# Patient Record
Sex: Female | Born: 1975 | Race: White | Hispanic: No | Marital: Single | Smoking: Current every day smoker
Health system: Southern US, Community
[De-identification: ages and names within clinical notes are randomized; demographics above are authoritative.]

## PROBLEM LIST (undated history)

## (undated) DIAGNOSIS — K219 Gastro-esophageal reflux disease without esophagitis: Secondary | ICD-10-CM

## (undated) DIAGNOSIS — E119 Type 2 diabetes mellitus without complications: Secondary | ICD-10-CM

## (undated) DIAGNOSIS — E7800 Pure hypercholesterolemia, unspecified: Secondary | ICD-10-CM

## (undated) DIAGNOSIS — F909 Attention-deficit hyperactivity disorder, unspecified type: Secondary | ICD-10-CM

## (undated) DIAGNOSIS — E559 Vitamin D deficiency, unspecified: Secondary | ICD-10-CM

## (undated) DIAGNOSIS — F319 Bipolar disorder, unspecified: Secondary | ICD-10-CM

## (undated) DIAGNOSIS — E079 Disorder of thyroid, unspecified: Secondary | ICD-10-CM

## (undated) HISTORY — DX: Bipolar disorder, unspecified: F31.9

## (undated) HISTORY — DX: Attention-deficit hyperactivity disorder, unspecified type: F90.9

## (undated) HISTORY — DX: Type 2 diabetes mellitus without complications: E11.9

## (undated) HISTORY — DX: Disorder of thyroid, unspecified: E07.9

## (undated) HISTORY — PX: DILATION AND CURETTAGE OF UTERUS: SHX78

## (undated) HISTORY — DX: Vitamin D deficiency, unspecified: E55.9

## (undated) HISTORY — DX: Pure hypercholesterolemia, unspecified: E78.00

## (undated) HISTORY — DX: Gastro-esophageal reflux disease without esophagitis: K21.9

---

## 2012-08-17 ENCOUNTER — Other Ambulatory Visit: Payer: Self-pay | Admitting: Neurology

## 2012-08-17 DIAGNOSIS — H539 Unspecified visual disturbance: Secondary | ICD-10-CM

## 2012-08-17 DIAGNOSIS — G8929 Other chronic pain: Secondary | ICD-10-CM

## 2012-08-21 ENCOUNTER — Ambulatory Visit (HOSPITAL_COMMUNITY)
Admission: RE | Admit: 2012-08-21 | Discharge: 2012-08-21 | Disposition: A | Discharge status: Home / Self Care | Payer: Medicaid Other | Source: Ambulatory Visit | Attending: Neurology | Admitting: Neurology

## 2012-08-21 DIAGNOSIS — H539 Unspecified visual disturbance: Secondary | ICD-10-CM

## 2012-08-21 DIAGNOSIS — H538 Other visual disturbances: Secondary | ICD-10-CM | POA: Insufficient documentation

## 2012-08-21 DIAGNOSIS — R51 Headache: Secondary | ICD-10-CM | POA: Insufficient documentation

## 2012-08-21 DIAGNOSIS — G8929 Other chronic pain: Secondary | ICD-10-CM

## 2014-07-05 ENCOUNTER — Ambulatory Visit (INDEPENDENT_AMBULATORY_CARE_PROVIDER_SITE_OTHER): Payer: 59 | Admitting: Psychiatry

## 2014-07-05 ENCOUNTER — Encounter (HOSPITAL_COMMUNITY): Payer: Self-pay | Admitting: Psychiatry

## 2014-07-05 VITALS — BP 102/78 | Ht 62.0 in | Wt 178.0 lb

## 2014-07-05 DIAGNOSIS — F3130 Bipolar disorder, current episode depressed, mild or moderate severity, unspecified: Secondary | ICD-10-CM | POA: Insufficient documentation

## 2014-07-05 MED ORDER — LITHIUM CARBONATE ER 450 MG PO TBCR: 450.0000 mg | EXTENDED_RELEASE_TABLET | Freq: Two times a day (BID) | ORAL | Status: DC

## 2014-07-05 MED ORDER — HYDROXYZINE HCL 50 MG PO TABS: 50.0000 mg | ORAL_TABLET | Freq: Four times a day (QID) | ORAL | Status: DC | PRN

## 2014-07-05 MED ORDER — ALPRAZOLAM 0.5 MG PO TABS: 0.5000 mg | ORAL_TABLET | Freq: Three times a day (TID) | ORAL | Status: DC | PRN

## 2014-07-05 MED ORDER — ARIPIPRAZOLE 5 MG PO TABS: 7.5000 mg | ORAL_TABLET | Freq: Every day | ORAL | Status: DC
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
## 2014-07-14 LAB — BASIC METABOLIC PANEL
BUN: 9 mg/dL (ref 6–23)
CO2: 23 mEq/L (ref 19–32)
Calcium: 9.5 mg/dL (ref 8.4–10.5)
Chloride: 109 mEq/L (ref 96–112)
Creat: 0.75 mg/dL (ref 0.50–1.10)
Glucose, Bld: 89 mg/dL (ref 70–99)
Potassium: 4.5 mEq/L (ref 3.5–5.3)
Sodium: 139 mEq/L (ref 135–145)

## 2014-07-14 LAB — LITHIUM LEVEL: Lithium Lvl: 0.4 mEq/L — ABNORMAL LOW (ref 0.80–1.40)

## 2014-08-03 ENCOUNTER — Encounter (HOSPITAL_COMMUNITY): Payer: Self-pay | Admitting: Psychiatry

## 2014-08-03 ENCOUNTER — Ambulatory Visit (INDEPENDENT_AMBULATORY_CARE_PROVIDER_SITE_OTHER): Payer: 59 | Admitting: Psychiatry

## 2014-08-03 VITALS — BP 120/78 | Ht 62.0 in | Wt 180.0 lb

## 2014-08-03 DIAGNOSIS — F3130 Bipolar disorder, current episode depressed, mild or moderate severity, unspecified: Secondary | ICD-10-CM

## 2014-08-03 MED ORDER — ARIPIPRAZOLE 10 MG PO TABS: 10.0000 mg | ORAL_TABLET | Freq: Every day | ORAL | Status: DC
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
## 2014-08-08 ENCOUNTER — Telehealth (HOSPITAL_COMMUNITY): Payer: Self-pay | Admitting: *Deleted

## 2014-08-08 ENCOUNTER — Other Ambulatory Visit (HOSPITAL_COMMUNITY): Payer: Self-pay | Admitting: Psychiatry

## 2014-08-08 MED ORDER — OXCARBAZEPINE 150 MG PO TABS: 150.0000 mg | ORAL_TABLET | Freq: Every day | ORAL | Status: DC

## 2014-08-08 NOTE — Telephone Encounter (Signed)
pt calling stating that her Lithium she started is not working for her. She would like for Dr. Tenny Crawoss to call her back.

## 2014-08-08 NOTE — Telephone Encounter (Signed)
Lithium was stopped last week. Now on abilify 10 mg daily. Having lots of mood swings. Will cut abilify down to 5 mg and start trieptal 150 mg daily

## 2014-08-31 ENCOUNTER — Encounter (HOSPITAL_COMMUNITY): Payer: Self-pay | Admitting: Psychiatry

## 2014-08-31 ENCOUNTER — Ambulatory Visit (INDEPENDENT_AMBULATORY_CARE_PROVIDER_SITE_OTHER): Payer: 59 | Admitting: Psychiatry

## 2014-08-31 VITALS — BP 126/92 | HR 96 | Ht 62.0 in | Wt 158.0 lb

## 2014-08-31 DIAGNOSIS — F3130 Bipolar disorder, current episode depressed, mild or moderate severity, unspecified: Secondary | ICD-10-CM

## 2014-08-31 MED ORDER — ARIPIPRAZOLE 10 MG PO TABS: ORAL_TABLET | ORAL | Status: DC

## 2014-08-31 MED ORDER — ALPRAZOLAM 0.5 MG PO TABS
0.5000 mg | ORAL_TABLET | Freq: Three times a day (TID) | ORAL | Status: DC | PRN
Start: 2014-08-31 — End: 2014-11-03

## 2014-08-31 MED ORDER — HYDROXYZINE HCL 50 MG PO TABS: 50.0000 mg | ORAL_TABLET | Freq: Four times a day (QID) | ORAL | Status: DC | PRN
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
## 2014-09-30 ENCOUNTER — Ambulatory Visit (INDEPENDENT_AMBULATORY_CARE_PROVIDER_SITE_OTHER): Payer: 59 | Admitting: Psychiatry

## 2014-09-30 ENCOUNTER — Encounter (HOSPITAL_COMMUNITY): Payer: Self-pay | Admitting: Psychiatry

## 2014-09-30 VITALS — BP 120/82 | Ht 62.0 in | Wt 179.0 lb

## 2014-09-30 DIAGNOSIS — F3162 Bipolar disorder, current episode mixed, moderate: Secondary | ICD-10-CM

## 2014-09-30 DIAGNOSIS — F319 Bipolar disorder, unspecified: Secondary | ICD-10-CM

## 2014-09-30 MED ORDER — ARIPIPRAZOLE 10 MG PO TABS: ORAL_TABLET | ORAL | Status: DC
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
## 2014-10-20 ENCOUNTER — Telehealth (HOSPITAL_COMMUNITY): Payer: Self-pay | Admitting: *Deleted

## 2014-10-20 NOTE — Telephone Encounter (Signed)
Pt calling stating she came in on 09-30-14 and was given samples Latuda 20, 40, 60. Pt states she will be out of these samples before her next appt and would like to know if she could get more samples. Pt next appt is 11-11-14.

## 2014-10-20 NOTE — Telephone Encounter (Signed)
She can have more 60 mg but I gave her enough for 4 weeks

## 2014-10-20 NOTE — Telephone Encounter (Signed)
Pt is aware of Dr. Ross decisions and shows understanding 

## 2014-11-01 ENCOUNTER — Ambulatory Visit (HOSPITAL_COMMUNITY): Payer: Self-pay | Admitting: Psychiatry

## 2014-11-03 ENCOUNTER — Ambulatory Visit (INDEPENDENT_AMBULATORY_CARE_PROVIDER_SITE_OTHER): Payer: 59 | Admitting: Psychiatry

## 2014-11-03 ENCOUNTER — Encounter (HOSPITAL_COMMUNITY): Payer: Self-pay | Admitting: Psychiatry

## 2014-11-03 VITALS — BP 123/86 | HR 90 | Ht 62.0 in | Wt 183.0 lb

## 2014-11-03 DIAGNOSIS — F3130 Bipolar disorder, current episode depressed, mild or moderate severity, unspecified: Secondary | ICD-10-CM

## 2014-11-03 DIAGNOSIS — F3162 Bipolar disorder, current episode mixed, moderate: Secondary | ICD-10-CM

## 2014-11-03 MED ORDER — LURASIDONE HCL 60 MG PO TABS: 60.0000 mg | ORAL_TABLET | Freq: Every day | ORAL | Status: DC

## 2014-11-03 MED ORDER — ARIPIPRAZOLE 10 MG PO TABS: ORAL_TABLET | ORAL | Status: DC

## 2014-11-03 MED ORDER — HYDROXYZINE HCL 50 MG PO TABS: 50.0000 mg | ORAL_TABLET | Freq: Four times a day (QID) | ORAL | Status: DC | PRN

## 2014-11-03 MED ORDER — ALPRAZOLAM 0.5 MG PO TABS: 0.5000 mg | ORAL_TABLET | Freq: Three times a day (TID) | ORAL | Status: DC | PRN
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
## 2015-02-03 ENCOUNTER — Ambulatory Visit (INDEPENDENT_AMBULATORY_CARE_PROVIDER_SITE_OTHER): Payer: 59 | Admitting: Psychiatry

## 2015-02-03 ENCOUNTER — Encounter (HOSPITAL_COMMUNITY): Payer: Self-pay | Admitting: Psychiatry

## 2015-02-03 VITALS — Ht 62.0 in | Wt 177.2 lb

## 2015-02-03 DIAGNOSIS — F3130 Bipolar disorder, current episode depressed, mild or moderate severity, unspecified: Secondary | ICD-10-CM

## 2015-02-03 DIAGNOSIS — F3162 Bipolar disorder, current episode mixed, moderate: Secondary | ICD-10-CM

## 2015-02-03 MED ORDER — ARIPIPRAZOLE 10 MG PO TABS: ORAL_TABLET | ORAL | Status: DC

## 2015-02-03 MED ORDER — ALPRAZOLAM 0.5 MG PO TABS: 0.5000 mg | ORAL_TABLET | Freq: Four times a day (QID) | ORAL | Status: DC

## 2015-02-03 MED ORDER — LURASIDONE HCL 60 MG PO TABS: 60.0000 mg | ORAL_TABLET | Freq: Every day | ORAL | Status: DC
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
## 2015-02-16 ENCOUNTER — Telehealth (HOSPITAL_COMMUNITY): Payer: Self-pay | Admitting: *Deleted

## 2015-02-16 NOTE — Telephone Encounter (Signed)
Called [place] [employer] to submit prior authorization as it will not be approved through cover my meds. Approval given by Tresa EndoKelly for 02/16/15-02/17/16 confirmation #[id number]. Notified pharmacy of approval.

## 2015-02-16 NOTE — Telephone Encounter (Signed)
Prior authorization for Abilify received. Submitted through cover my meds.

## 2015-04-20 ENCOUNTER — Telehealth (HOSPITAL_COMMUNITY): Payer: Self-pay | Admitting: *Deleted

## 2015-04-20 ENCOUNTER — Other Ambulatory Visit (HOSPITAL_COMMUNITY): Payer: Self-pay | Admitting: Psychiatry

## 2015-04-20 MED ORDER — LURASIDONE HCL 60 MG PO TABS: 60.0000 mg | ORAL_TABLET | Freq: Every day | ORAL | Status: DC

## 2015-04-20 MED ORDER — ARIPIPRAZOLE 10 MG PO TABS: ORAL_TABLET | ORAL | Status: DC

## 2015-04-20 NOTE — Telephone Encounter (Signed)
Pt pharmacy is requesting refills for pt Abilify 10 mg  BID and Latuda 60 mg QD. Both medications were filled 02-03-15 and was informed to f/u in 3 mo which will be 05-03-15. Pt pharmacy number is [id number]

## 2015-04-20 NOTE — Telephone Encounter (Signed)
sent 

## 2015-04-20 NOTE — Telephone Encounter (Signed)
Noted  

## 2015-05-03 ENCOUNTER — Ambulatory Visit (INDEPENDENT_AMBULATORY_CARE_PROVIDER_SITE_OTHER): Payer: 59 | Admitting: Psychiatry

## 2015-05-03 ENCOUNTER — Encounter (HOSPITAL_COMMUNITY): Payer: Self-pay | Admitting: Psychiatry

## 2015-05-03 VITALS — BP 117/74 | HR 82 | Ht 62.0 in | Wt 180.0 lb

## 2015-05-03 DIAGNOSIS — F3162 Bipolar disorder, current episode mixed, moderate: Secondary | ICD-10-CM | POA: Diagnosis not present

## 2015-05-03 MED ORDER — LURASIDONE HCL 60 MG PO TABS: 60.0000 mg | ORAL_TABLET | Freq: Every day | ORAL | Status: DC

## 2015-05-03 MED ORDER — ARIPIPRAZOLE 10 MG PO TABS: ORAL_TABLET | ORAL | Status: DC

## 2015-05-03 MED ORDER — ALPRAZOLAM 0.5 MG PO TABS: 0.5000 mg | ORAL_TABLET | Freq: Four times a day (QID) | ORAL | Status: DC
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
## 2015-08-03 ENCOUNTER — Encounter (HOSPITAL_COMMUNITY): Payer: Self-pay | Admitting: Psychiatry

## 2015-08-03 ENCOUNTER — Ambulatory Visit (INDEPENDENT_AMBULATORY_CARE_PROVIDER_SITE_OTHER): Payer: 59 | Admitting: Psychiatry

## 2015-08-03 VITALS — BP 116/84 | HR 101 | Ht 62.0 in | Wt 179.4 lb

## 2015-08-03 DIAGNOSIS — F3130 Bipolar disorder, current episode depressed, mild or moderate severity, unspecified: Secondary | ICD-10-CM

## 2015-08-03 DIAGNOSIS — F3162 Bipolar disorder, current episode mixed, moderate: Secondary | ICD-10-CM

## 2015-08-03 MED ORDER — LURASIDONE HCL 60 MG PO TABS: 60.0000 mg | ORAL_TABLET | Freq: Every day | ORAL | Status: DC

## 2015-08-03 MED ORDER — ARIPIPRAZOLE 10 MG PO TABS: ORAL_TABLET | ORAL | Status: DC

## 2015-08-03 MED ORDER — TEMAZEPAM 15 MG PO CAPS: 15.0000 mg | ORAL_CAPSULE | Freq: Every evening | ORAL | Status: DC | PRN

## 2015-08-03 MED ORDER — ALPRAZOLAM 0.5 MG PO TABS: 0.5000 mg | ORAL_TABLET | Freq: Four times a day (QID) | ORAL | Status: DC
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
## 2015-09-29 ENCOUNTER — Telehealth (HOSPITAL_COMMUNITY): Payer: Self-pay | Admitting: *Deleted

## 2015-09-29 NOTE — Telephone Encounter (Signed)
lmtcb number provided 

## 2015-09-29 NOTE — Telephone Encounter (Signed)
phone call at 8:52 a.m. from patient, stated she has an appointment on the 11th, she think she is coming down with flu.   she need refill of medications.

## 2015-10-02 NOTE — Telephone Encounter (Signed)
Pt appt was rescheduled and was informed that her pharmacy should still have 1 more refill for her. Pt showed understanding

## 2015-10-03 ENCOUNTER — Ambulatory Visit (HOSPITAL_COMMUNITY): Payer: Self-pay | Admitting: Psychiatry

## 2015-10-19 ENCOUNTER — Encounter (HOSPITAL_COMMUNITY): Payer: Self-pay | Admitting: Psychiatry

## 2015-10-19 ENCOUNTER — Ambulatory Visit (INDEPENDENT_AMBULATORY_CARE_PROVIDER_SITE_OTHER): Payer: 59 | Admitting: Psychiatry

## 2015-10-19 VITALS — BP 123/86 | HR 95 | Ht 62.0 in | Wt 180.2 lb

## 2015-10-19 DIAGNOSIS — F3130 Bipolar disorder, current episode depressed, mild or moderate severity, unspecified: Secondary | ICD-10-CM | POA: Diagnosis not present

## 2015-10-19 DIAGNOSIS — F3162 Bipolar disorder, current episode mixed, moderate: Secondary | ICD-10-CM

## 2015-10-19 MED ORDER — TEMAZEPAM 15 MG PO CAPS: 15.0000 mg | ORAL_CAPSULE | Freq: Every evening | ORAL | Status: DC | PRN

## 2015-10-19 MED ORDER — ALPRAZOLAM 0.5 MG PO TABS: 0.5000 mg | ORAL_TABLET | Freq: Four times a day (QID) | ORAL | Status: DC

## 2015-10-19 MED ORDER — LURASIDONE HCL 80 MG PO TABS: 80.0000 mg | ORAL_TABLET | Freq: Every day | ORAL | Status: DC

## 2015-10-19 MED ORDER — ARIPIPRAZOLE 10 MG PO TABS: ORAL_TABLET | ORAL | Status: DC
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
## 2015-12-19 ENCOUNTER — Encounter (HOSPITAL_COMMUNITY): Payer: Self-pay | Admitting: Psychiatry

## 2015-12-19 ENCOUNTER — Ambulatory Visit (INDEPENDENT_AMBULATORY_CARE_PROVIDER_SITE_OTHER): Payer: Medicaid Other | Admitting: Psychiatry

## 2015-12-19 VITALS — BP 113/70 | HR 95 | Ht 62.0 in | Wt 178.2 lb

## 2015-12-19 DIAGNOSIS — F3162 Bipolar disorder, current episode mixed, moderate: Secondary | ICD-10-CM | POA: Diagnosis not present

## 2015-12-19 MED ORDER — ALPRAZOLAM 1 MG PO TABS: 1.0000 mg | ORAL_TABLET | Freq: Three times a day (TID) | ORAL | Status: DC | PRN

## 2015-12-19 MED ORDER — ARIPIPRAZOLE 10 MG PO TABS: ORAL_TABLET | ORAL | Status: DC

## 2015-12-19 MED ORDER — TEMAZEPAM 15 MG PO CAPS: 15.0000 mg | ORAL_CAPSULE | Freq: Every evening | ORAL | Status: DC | PRN

## 2015-12-19 MED ORDER — LURASIDONE HCL 80 MG PO TABS: 80.0000 mg | ORAL_TABLET | Freq: Every day | ORAL | Status: DC
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
## 2016-02-19 ENCOUNTER — Ambulatory Visit (INDEPENDENT_AMBULATORY_CARE_PROVIDER_SITE_OTHER): Payer: Medicaid Other | Admitting: Psychiatry

## 2016-02-19 ENCOUNTER — Encounter (HOSPITAL_COMMUNITY): Payer: Self-pay | Admitting: Psychiatry

## 2016-02-19 VITALS — BP 116/87 | HR 98 | Ht 62.0 in | Wt 174.8 lb

## 2016-02-19 DIAGNOSIS — F3162 Bipolar disorder, current episode mixed, moderate: Secondary | ICD-10-CM | POA: Diagnosis not present

## 2016-02-19 MED ORDER — ARIPIPRAZOLE 10 MG PO TABS: ORAL_TABLET | ORAL | Status: DC

## 2016-02-19 MED ORDER — TEMAZEPAM 15 MG PO CAPS: 15.0000 mg | ORAL_CAPSULE | Freq: Every evening | ORAL | Status: DC | PRN

## 2016-02-19 MED ORDER — ALPRAZOLAM 1 MG PO TABS: 1.0000 mg | ORAL_TABLET | Freq: Three times a day (TID) | ORAL | Status: DC | PRN

## 2016-02-19 MED ORDER — LURASIDONE HCL 80 MG PO TABS: 80.0000 mg | ORAL_TABLET | Freq: Every day | ORAL | Status: DC
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
## 2016-03-12 ENCOUNTER — Other Ambulatory Visit (HOSPITAL_COMMUNITY): Payer: Self-pay | Admitting: Psychiatry

## 2016-03-12 ENCOUNTER — Telehealth (HOSPITAL_COMMUNITY): Payer: Self-pay | Admitting: *Deleted

## 2016-03-12 MED ORDER — LURASIDONE HCL 20 MG PO TABS: 20.0000 mg | ORAL_TABLET | Freq: Every day | ORAL | Status: DC

## 2016-03-12 NOTE — Telephone Encounter (Signed)
patient would like a phone call please regarding her Latuda.  She said the last time she was here the doctor asked her if she had restless leg.  She said her legs are restless, she can't keep them still, and Latuda is making her eyes twitch.

## 2016-03-12 NOTE — Telephone Encounter (Signed)
Spoken to pt-I have cut down dosage to 20 mg

## 2016-03-12 NOTE — Telephone Encounter (Signed)
noted 

## 2016-04-11 ENCOUNTER — Telehealth (HOSPITAL_COMMUNITY): Payer: Self-pay | Admitting: *Deleted

## 2016-04-11 NOTE — Telephone Encounter (Signed)
Pt called stating the Kasandra KnudsenLatuda is not working. Per pt, her boyfriend is getting ready to have open heart surgery and she have a lot going on and the JordanLatuda is not doing anything. Pt states she is irritated, can not consentrate and can not keep mind on one thing and it keeps going from one this to the next. Asked pt if this is an emergency and if she is of danger to herself or anyone else and pt stated no. Informed pt that provider is out of the office and is she able to wait until provider returns to office for a response and pt stated yes she can wait. Per pt, she will keep taking medication until provider respond. Pt number is [phone].

## 2016-04-15 NOTE — Telephone Encounter (Signed)
Will increase latuda to 40 mg

## 2016-04-16 ENCOUNTER — Telehealth (HOSPITAL_COMMUNITY): Payer: Self-pay | Admitting: *Deleted

## 2016-04-16 MED ORDER — LURASIDONE HCL 40 MG PO TABS: 40.0000 mg | ORAL_TABLET | Freq: Every day | ORAL | Status: DC

## 2016-04-16 NOTE — Telephone Encounter (Signed)
Pt called back and office informed her of Dr. Tenny Crawoss increasing her Latuda to 40 mg QD. Office sent new script to pt pharmacy due to pt stated she is out of her 80 mg that she was cutting in half.

## 2016-04-16 NOTE — Telephone Encounter (Signed)
Per Dr. Tenny Crawoss, she will increase pt Latuda to 40 mg due to previous call. Called pt and informed her and she shows understanding.

## 2016-04-16 NOTE — Telephone Encounter (Signed)
Was speaking with pt but connection was bad and phone got disconnected. Before phone was disconnected, informed pt of what Dr. Tenny Crawoss stated and showed understanding. Office tried calling pt back but phone rang and no answer but pt do not have voicemail set up yet. Will try calling back at another time.

## 2016-05-01 ENCOUNTER — Telehealth (HOSPITAL_COMMUNITY): Payer: Self-pay | Admitting: *Deleted

## 2016-05-01 NOTE — Telephone Encounter (Signed)
You may send in one month

## 2016-05-01 NOTE — Telephone Encounter (Signed)
Pt pharmacy faxed refill request for pt Xanax. Medication last filled 02-19-16 with 2 refills. Pt f/u appt is scheduled for 05-17-16. Pharmacy number is [phone]

## 2016-05-02 ENCOUNTER — Telehealth (HOSPITAL_COMMUNITY): Payer: Self-pay | Admitting: *Deleted

## 2016-05-02 MED ORDER — ALPRAZOLAM 1 MG PO TABS: 1.0000 mg | ORAL_TABLET | Freq: Three times a day (TID) | ORAL | Status: DC | PRN

## 2016-05-02 NOTE — Telephone Encounter (Signed)
Per provider to call in one month refill for pt Xanax. Called pharmacy and spoke with Greig CastillaAndrew.

## 2016-05-02 NOTE — Telephone Encounter (Signed)
Called pt pharmacy and spoke with Greig CastillaAndrew. Per Greig CastillaAndrew they will get medication ready for pt

## 2016-05-17 ENCOUNTER — Encounter (HOSPITAL_COMMUNITY): Payer: Self-pay | Admitting: Psychiatry

## 2016-05-17 ENCOUNTER — Ambulatory Visit (INDEPENDENT_AMBULATORY_CARE_PROVIDER_SITE_OTHER): Payer: Medicaid Other | Admitting: Psychiatry

## 2016-05-17 VITALS — BP 100/80 | Ht 62.0 in | Wt 176.0 lb

## 2016-05-17 DIAGNOSIS — F3130 Bipolar disorder, current episode depressed, mild or moderate severity, unspecified: Secondary | ICD-10-CM

## 2016-05-17 DIAGNOSIS — F3162 Bipolar disorder, current episode mixed, moderate: Secondary | ICD-10-CM

## 2016-05-17 MED ORDER — TEMAZEPAM 15 MG PO CAPS: 15.0000 mg | ORAL_CAPSULE | Freq: Every evening | ORAL | Status: DC | PRN

## 2016-05-17 MED ORDER — ARIPIPRAZOLE 10 MG PO TABS: ORAL_TABLET | ORAL | Status: DC

## 2016-05-17 MED ORDER — ALPRAZOLAM 1 MG PO TABS: 1.0000 mg | ORAL_TABLET | Freq: Three times a day (TID) | ORAL | Status: DC | PRN

## 2016-05-17 MED ORDER — LURASIDONE HCL 40 MG PO TABS: 40.0000 mg | ORAL_TABLET | Freq: Every day | ORAL | Status: DC
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
## 2016-07-10 ENCOUNTER — Encounter (HOSPITAL_COMMUNITY): Payer: Self-pay | Admitting: Psychiatry

## 2016-07-10 ENCOUNTER — Telehealth (HOSPITAL_COMMUNITY): Payer: Self-pay | Admitting: *Deleted

## 2016-07-10 ENCOUNTER — Encounter (HOSPITAL_COMMUNITY): Payer: Self-pay | Admitting: *Deleted

## 2016-07-10 ENCOUNTER — Ambulatory Visit (INDEPENDENT_AMBULATORY_CARE_PROVIDER_SITE_OTHER): Payer: Medicaid Other | Admitting: Psychiatry

## 2016-07-10 VITALS — BP 130/90 | Ht 62.0 in | Wt 176.0 lb

## 2016-07-10 DIAGNOSIS — F3162 Bipolar disorder, current episode mixed, moderate: Secondary | ICD-10-CM

## 2016-07-10 DIAGNOSIS — F3130 Bipolar disorder, current episode depressed, mild or moderate severity, unspecified: Secondary | ICD-10-CM | POA: Diagnosis not present

## 2016-07-10 MED ORDER — LURASIDONE HCL 40 MG PO TABS: 40.0000 mg | ORAL_TABLET | Freq: Two times a day (BID) | ORAL | Status: DC

## 2016-07-10 MED ORDER — TEMAZEPAM 15 MG PO CAPS: 15.0000 mg | ORAL_CAPSULE | Freq: Every evening | ORAL | Status: DC | PRN

## 2016-07-10 MED ORDER — ARIPIPRAZOLE 10 MG PO TABS
ORAL_TABLET | ORAL | Status: DC
Start: 2016-07-10 — End: 2016-08-16

## 2016-07-10 MED ORDER — ALPRAZOLAM 1 MG PO TABS: 1.0000 mg | ORAL_TABLET | Freq: Three times a day (TID) | ORAL | Status: DC | PRN

## 2016-07-10 NOTE — Telephone Encounter (Signed)
She will need to come in 
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
## 2016-07-10 NOTE — Telephone Encounter (Signed)
Pt called stating the Latuda 40 mg is not working. Per pt, she is dealing with a lot right now. Per pt, is it possible if Dr. Tenny Crawoss could put her on something else. Pt number is [phone].

## 2016-07-10 NOTE — Telephone Encounter (Signed)
Pt came for appt today. 

## 2016-07-26 ENCOUNTER — Telehealth (HOSPITAL_COMMUNITY): Payer: Self-pay | Admitting: *Deleted

## 2016-07-26 NOTE — Telephone Encounter (Signed)
Prior authorization for Abilify received. Called [facility] spoke with Consuela who gave approval [id number] good until 07/21/17. Called to notify pharmacy.

## 2016-07-29 NOTE — Telephone Encounter (Signed)
noted 

## 2016-08-16 ENCOUNTER — Ambulatory Visit (INDEPENDENT_AMBULATORY_CARE_PROVIDER_SITE_OTHER): Payer: Medicaid Other | Admitting: Psychiatry

## 2016-08-16 ENCOUNTER — Encounter (HOSPITAL_COMMUNITY): Payer: Self-pay | Admitting: Psychiatry

## 2016-08-16 VITALS — BP 112/74 | HR 103 | Ht 62.0 in | Wt 180.6 lb

## 2016-08-16 DIAGNOSIS — F3162 Bipolar disorder, current episode mixed, moderate: Secondary | ICD-10-CM

## 2016-08-16 DIAGNOSIS — F3130 Bipolar disorder, current episode depressed, mild or moderate severity, unspecified: Secondary | ICD-10-CM

## 2016-08-16 MED ORDER — ARIPIPRAZOLE 10 MG PO TABS
ORAL_TABLET | ORAL | 2 refills | Status: DC
Start: 2016-08-16 — End: 2016-12-03

## 2016-08-16 MED ORDER — DULOXETINE HCL 60 MG PO CPEP: 60.0000 mg | ORAL_CAPSULE | Freq: Every day | ORAL | 2 refills | Status: DC
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
## 2016-09-10 ENCOUNTER — Telehealth (HOSPITAL_COMMUNITY): Payer: Self-pay | Admitting: *Deleted

## 2016-09-10 NOTE — Telephone Encounter (Signed)
Prior authorization for Abilify received. Called [facility] spoke with Tiffany who gave approval [id number]. Called to notify pharmacy.

## 2016-09-16 ENCOUNTER — Ambulatory Visit (INDEPENDENT_AMBULATORY_CARE_PROVIDER_SITE_OTHER): Payer: Medicaid Other | Admitting: Psychiatry

## 2016-09-16 ENCOUNTER — Encounter (HOSPITAL_COMMUNITY): Payer: Self-pay | Admitting: Psychiatry

## 2016-09-16 VITALS — BP 133/83 | HR 96 | Ht 62.0 in | Wt 176.8 lb

## 2016-09-16 DIAGNOSIS — F3162 Bipolar disorder, current episode mixed, moderate: Secondary | ICD-10-CM | POA: Diagnosis not present

## 2016-09-16 MED ORDER — ALPRAZOLAM 1 MG PO TABS: 1.0000 mg | ORAL_TABLET | Freq: Three times a day (TID) | ORAL | 2 refills | Status: DC | PRN

## 2016-09-16 MED ORDER — TEMAZEPAM 15 MG PO CAPS: 15.0000 mg | ORAL_CAPSULE | Freq: Every evening | ORAL | 2 refills | Status: DC | PRN
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
## 2016-11-06 ENCOUNTER — Telehealth (HOSPITAL_COMMUNITY): Payer: Self-pay | Admitting: *Deleted

## 2016-11-06 ENCOUNTER — Other Ambulatory Visit (HOSPITAL_COMMUNITY): Payer: Self-pay | Admitting: Psychiatry

## 2016-11-06 MED ORDER — DULOXETINE HCL 60 MG PO CPEP: 60.0000 mg | ORAL_CAPSULE | Freq: Two times a day (BID) | ORAL | 2 refills | Status: DC

## 2016-11-06 NOTE — Telephone Encounter (Signed)
Called pt due to previous message left. Per pt, she would like to increase her Cymbalta due to fiance had been in hospital and just got out and now having to help him. Per pt her aunt is not doing too good as well. Per pt she would like to know if she could go up to 2 tablets a day like one in AM and one in PM or does Dr. Tenny Crawoss wants her to wait until her appt on 11-13-2016. Pt number is [phone]

## 2016-11-06 NOTE — Telephone Encounter (Signed)
She can go to 2 a day, prescription sent

## 2016-11-06 NOTE — Telephone Encounter (Signed)
phone call from patient. she would like the Cymbalta to be increased.   She is scheduled for 11/13/16.  Can she get an increase now of do she need to wait until she is seen?

## 2016-11-06 NOTE — Telephone Encounter (Signed)
lmtcb and office number provided 

## 2016-11-07 NOTE — Telephone Encounter (Signed)
Spoke with pt and she verbalized understanding.

## 2016-11-13 ENCOUNTER — Ambulatory Visit (HOSPITAL_COMMUNITY): Payer: Self-pay | Admitting: Psychiatry

## 2016-12-02 ENCOUNTER — Ambulatory Visit (HOSPITAL_COMMUNITY): Payer: Self-pay | Admitting: Psychiatry

## 2016-12-03 ENCOUNTER — Encounter (HOSPITAL_COMMUNITY): Payer: Self-pay | Admitting: Psychiatry

## 2016-12-03 ENCOUNTER — Ambulatory Visit (INDEPENDENT_AMBULATORY_CARE_PROVIDER_SITE_OTHER): Payer: Medicaid Other | Admitting: Psychiatry

## 2016-12-03 VITALS — BP 101/80 | HR 103 | Ht 62.0 in | Wt 178.4 lb

## 2016-12-03 DIAGNOSIS — F3162 Bipolar disorder, current episode mixed, moderate: Secondary | ICD-10-CM

## 2016-12-03 DIAGNOSIS — Z79899 Other long term (current) drug therapy: Secondary | ICD-10-CM

## 2016-12-03 DIAGNOSIS — Z811 Family history of alcohol abuse and dependence: Secondary | ICD-10-CM

## 2016-12-03 DIAGNOSIS — Z818 Family history of other mental and behavioral disorders: Secondary | ICD-10-CM | POA: Diagnosis not present

## 2016-12-03 MED ORDER — DULOXETINE HCL 60 MG PO CPEP: 60.0000 mg | ORAL_CAPSULE | Freq: Two times a day (BID) | ORAL | 2 refills | Status: DC

## 2016-12-03 MED ORDER — ALPRAZOLAM 1 MG PO TABS: 1.0000 mg | ORAL_TABLET | Freq: Three times a day (TID) | ORAL | 2 refills | Status: DC | PRN

## 2016-12-03 MED ORDER — ARIPIPRAZOLE 10 MG PO TABS: ORAL_TABLET | ORAL | 2 refills | Status: DC

## 2016-12-03 MED ORDER — TEMAZEPAM 15 MG PO CAPS: 15.0000 mg | ORAL_CAPSULE | Freq: Every evening | ORAL | 2 refills | Status: DC | PRN
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
## 2017-03-03 ENCOUNTER — Ambulatory Visit (INDEPENDENT_AMBULATORY_CARE_PROVIDER_SITE_OTHER): Payer: Medicaid Other | Admitting: Psychiatry

## 2017-03-03 ENCOUNTER — Encounter (HOSPITAL_COMMUNITY): Payer: Self-pay | Admitting: Psychiatry

## 2017-03-03 VITALS — BP 119/86 | HR 101 | Ht 62.0 in | Wt 182.6 lb

## 2017-03-03 DIAGNOSIS — Z811 Family history of alcohol abuse and dependence: Secondary | ICD-10-CM | POA: Diagnosis not present

## 2017-03-03 DIAGNOSIS — F3162 Bipolar disorder, current episode mixed, moderate: Secondary | ICD-10-CM

## 2017-03-03 DIAGNOSIS — Z818 Family history of other mental and behavioral disorders: Secondary | ICD-10-CM | POA: Diagnosis not present

## 2017-03-03 MED ORDER — ARIPIPRAZOLE 10 MG PO TABS: ORAL_TABLET | ORAL | 2 refills | Status: DC

## 2017-03-03 MED ORDER — METHYLPHENIDATE HCL 10 MG PO TABS: 10.0000 mg | ORAL_TABLET | Freq: Two times a day (BID) | ORAL | 0 refills | Status: DC

## 2017-03-03 MED ORDER — ALPRAZOLAM 1 MG PO TABS: 1.0000 mg | ORAL_TABLET | Freq: Three times a day (TID) | ORAL | 2 refills | Status: DC | PRN

## 2017-03-03 MED ORDER — DULOXETINE HCL 60 MG PO CPEP: 60.0000 mg | ORAL_CAPSULE | Freq: Two times a day (BID) | ORAL | 2 refills | Status: DC

## 2017-03-03 MED ORDER — TEMAZEPAM 15 MG PO CAPS: 15.0000 mg | ORAL_CAPSULE | Freq: Every evening | ORAL | 2 refills | Status: DC | PRN
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
## 2017-03-28 ENCOUNTER — Ambulatory Visit (INDEPENDENT_AMBULATORY_CARE_PROVIDER_SITE_OTHER): Payer: Medicaid Other | Admitting: Psychiatry

## 2017-03-28 ENCOUNTER — Encounter (HOSPITAL_COMMUNITY): Payer: Self-pay | Admitting: Psychiatry

## 2017-03-28 VITALS — BP 128/82 | HR 89 | Ht 62.0 in | Wt 178.2 lb

## 2017-03-28 DIAGNOSIS — Z811 Family history of alcohol abuse and dependence: Secondary | ICD-10-CM

## 2017-03-28 DIAGNOSIS — Z818 Family history of other mental and behavioral disorders: Secondary | ICD-10-CM

## 2017-03-28 DIAGNOSIS — F3130 Bipolar disorder, current episode depressed, mild or moderate severity, unspecified: Secondary | ICD-10-CM | POA: Diagnosis not present

## 2017-03-28 DIAGNOSIS — F3162 Bipolar disorder, current episode mixed, moderate: Secondary | ICD-10-CM

## 2017-03-28 DIAGNOSIS — Z79899 Other long term (current) drug therapy: Secondary | ICD-10-CM

## 2017-03-28 MED ORDER — ARIPIPRAZOLE 10 MG PO TABS: ORAL_TABLET | ORAL | 2 refills | Status: DC

## 2017-03-28 MED ORDER — METHYLPHENIDATE HCL 20 MG PO TABS: 20.0000 mg | ORAL_TABLET | Freq: Two times a day (BID) | ORAL | 0 refills | Status: DC

## 2017-03-28 MED ORDER — DULOXETINE HCL 60 MG PO CPEP: 60.0000 mg | ORAL_CAPSULE | Freq: Two times a day (BID) | ORAL | 2 refills | Status: DC
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
## 2017-04-01 ENCOUNTER — Telehealth (HOSPITAL_COMMUNITY): Payer: Self-pay | Admitting: *Deleted

## 2017-04-01 NOTE — Telephone Encounter (Signed)
Pt called stating she would like to know if Dr. Tenny Craw could write her a letter. Per pt she's been collecting SSI 2000 and pt is trying to get Disability. Per pt, her condition has been worse because she have never been able to work and do not know why they did not put her on Disability. Per pt, she would like for Dr. Tenny Craw to state her conditions, her medications that are being prescribed and why she is not able to hold a job. Per pt, she is trying to either take the letter to a lawyer or the disability office. Per pt she would like for someone to call her back. [phone] and [phone].

## 2017-04-01 NOTE — Telephone Encounter (Signed)
She needs to apply for disability and then they will ask for specific information

## 2017-04-02 NOTE — Telephone Encounter (Signed)
Called pt at [phone] and phone only rang once and went to voicemail. Staff lmtcb and office number was provided

## 2017-04-02 NOTE — Telephone Encounter (Signed)
Pt called office back due to office message that was left. Informed pt with what provider stated and pt verbalized understanding. Per pt she keeps going to the disability office and they keep giving her the run around. Per pt they keep giving her the same man and he is telling her that disability is another form of SSI and she do not think so. Staff suggested to pt that she do her own research to move forward. Pt agreed and verbalized understanding. Staff also informed pt that when she does apply for disability, she need to sign release with office for everyone that will be request records from office. Pt verbalized understanding and agreed to when she gets everything situated.

## 2017-04-02 NOTE — Telephone Encounter (Signed)
Called pt due to previous call. Staff unable to reach at [phone]. Staff left message on pt personal voicemail for pt to call office. Office number was provided.

## 2017-04-07 ENCOUNTER — Telehealth (HOSPITAL_COMMUNITY): Payer: Self-pay | Admitting: *Deleted

## 2017-04-07 NOTE — Telephone Encounter (Signed)
Ritalin 20 MG., she take 1 breakfast and 1 at lunch.   the medicine is giving her a headache.   She wants to be put on something else, since the Ritalin is causing her to have bad headaches.

## 2017-04-08 ENCOUNTER — Other Ambulatory Visit (HOSPITAL_COMMUNITY): Payer: Self-pay | Admitting: Psychiatry

## 2017-04-08 MED ORDER — AMPHETAMINE-DEXTROAMPHETAMINE 20 MG PO TABS: 20.0000 mg | ORAL_TABLET | Freq: Two times a day (BID) | ORAL | 0 refills | Status: DC

## 2017-04-08 NOTE — Telephone Encounter (Signed)
Ritalin 20 MG., she take 1 breakfast and 1 at lunch. The medicine is giving her a headache. She wants to be put on something else, since the Ritalin is causing her to have bad headaches.

## 2017-04-08 NOTE — Telephone Encounter (Signed)
Called pt and informed her that her printed script is ready for pick up. Pt verbalized understanding.

## 2017-04-08 NOTE — Telephone Encounter (Signed)
adderall printed

## 2017-04-09 ENCOUNTER — Encounter (HOSPITAL_COMMUNITY): Payer: Self-pay | Admitting: *Deleted

## 2017-04-09 NOTE — Progress Notes (Signed)
Pt came into office to pick up her printed script for Adderall 20 mg BID. Pt D/L number is [id number] exp 11-02-2019. Script ID number [id number], script order number [id number].

## 2017-04-10 ENCOUNTER — Telehealth (HOSPITAL_COMMUNITY): Payer: Self-pay | Admitting: *Deleted

## 2017-04-10 NOTE — Telephone Encounter (Signed)
Prior authorization for aripiprazole received. Called [facility] spoke with Annabella who gave approval #[id number]. Called to notify pharmacy.

## 2017-04-11 NOTE — Telephone Encounter (Signed)
Noted. Called pt to verify she picked up her Abilify from pharmacy and she stated she did

## 2017-04-11 NOTE — Telephone Encounter (Signed)
noted 

## 2017-05-13 ENCOUNTER — Encounter (HOSPITAL_COMMUNITY): Payer: Self-pay | Admitting: *Deleted

## 2017-05-13 ENCOUNTER — Other Ambulatory Visit (HOSPITAL_COMMUNITY): Payer: Self-pay | Admitting: Psychiatry

## 2017-05-13 ENCOUNTER — Telehealth (HOSPITAL_COMMUNITY): Payer: Self-pay | Admitting: *Deleted

## 2017-05-13 MED ORDER — AMPHETAMINE-DEXTROAMPHETAMINE 20 MG PO TABS: 20.0000 mg | ORAL_TABLET | Freq: Two times a day (BID) | ORAL | 0 refills | Status: DC

## 2017-05-13 NOTE — Telephone Encounter (Signed)
printed

## 2017-05-13 NOTE — Telephone Encounter (Signed)
Pt called stating her Adderall BID is working since the last time Dr. Tenny Crawoss put her on it. Per pt, she is out of refills and would like to have more refills. Per pt chart, her f/u appt with 05-28-2017. Per pt chart, medication last printed on 04-08-2017 with 60 tabs 0 refill. Pt number is [phone].

## 2017-05-14 ENCOUNTER — Encounter (HOSPITAL_COMMUNITY): Payer: Self-pay | Admitting: *Deleted

## 2017-05-14 NOTE — Telephone Encounter (Signed)
Spoke with pt and she will pick script up shortly

## 2017-05-14 NOTE — Progress Notes (Signed)
Pt came into office to pick up her printed script for Adderall 20 mg BID. Script ID number is [id number] and script order ID number is [id number]. Pt D/L number is [id number] and exp date is 11-02-2019.

## 2017-05-28 ENCOUNTER — Encounter (HOSPITAL_COMMUNITY): Payer: Self-pay | Admitting: Psychiatry

## 2017-05-28 ENCOUNTER — Ambulatory Visit (INDEPENDENT_AMBULATORY_CARE_PROVIDER_SITE_OTHER): Payer: Medicaid Other | Admitting: Psychiatry

## 2017-05-28 VITALS — BP 117/84 | HR 84 | Ht 62.0 in | Wt 170.0 lb

## 2017-05-28 DIAGNOSIS — Z818 Family history of other mental and behavioral disorders: Secondary | ICD-10-CM | POA: Diagnosis not present

## 2017-05-28 DIAGNOSIS — F3162 Bipolar disorder, current episode mixed, moderate: Secondary | ICD-10-CM | POA: Diagnosis not present

## 2017-05-28 DIAGNOSIS — Z811 Family history of alcohol abuse and dependence: Secondary | ICD-10-CM | POA: Diagnosis not present

## 2017-05-28 MED ORDER — AMPHETAMINE-DEXTROAMPHET ER 20 MG PO CP24: 20.0000 mg | ORAL_CAPSULE | Freq: Two times a day (BID) | ORAL | 0 refills | Status: DC

## 2017-05-28 MED ORDER — ARIPIPRAZOLE 10 MG PO TABS: ORAL_TABLET | ORAL | 2 refills | Status: DC

## 2017-05-28 MED ORDER — TEMAZEPAM 15 MG PO CAPS: 15.0000 mg | ORAL_CAPSULE | Freq: Every evening | ORAL | 2 refills | Status: DC | PRN

## 2017-05-28 MED ORDER — DULOXETINE HCL 60 MG PO CPEP: 60.0000 mg | ORAL_CAPSULE | Freq: Two times a day (BID) | ORAL | 2 refills | Status: DC

## 2017-05-28 MED ORDER — ALPRAZOLAM 1 MG PO TABS: 1.0000 mg | ORAL_TABLET | Freq: Three times a day (TID) | ORAL | 2 refills | Status: DC | PRN
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
## 2017-05-30 ENCOUNTER — Telehealth (HOSPITAL_COMMUNITY): Payer: Self-pay | Admitting: *Deleted

## 2017-05-30 NOTE — Telephone Encounter (Signed)
Prior authorization for Adderall XR received. Called [facility] spoke with Rosanne AshingJim who states that no authorization is required. Pharmacy received paid claim on 05/29/17.

## 2017-07-31 ENCOUNTER — Encounter (HOSPITAL_COMMUNITY): Payer: Self-pay | Admitting: Psychiatry

## 2017-07-31 ENCOUNTER — Ambulatory Visit (INDEPENDENT_AMBULATORY_CARE_PROVIDER_SITE_OTHER): Payer: Medicaid Other | Admitting: Psychiatry

## 2017-07-31 VITALS — BP 119/76 | HR 95 | Ht 62.0 in | Wt 173.0 lb

## 2017-07-31 DIAGNOSIS — Z818 Family history of other mental and behavioral disorders: Secondary | ICD-10-CM

## 2017-07-31 DIAGNOSIS — Z91410 Personal history of adult physical and sexual abuse: Secondary | ICD-10-CM | POA: Diagnosis not present

## 2017-07-31 DIAGNOSIS — F3130 Bipolar disorder, current episode depressed, mild or moderate severity, unspecified: Secondary | ICD-10-CM | POA: Diagnosis not present

## 2017-07-31 DIAGNOSIS — Z811 Family history of alcohol abuse and dependence: Secondary | ICD-10-CM

## 2017-07-31 DIAGNOSIS — F3162 Bipolar disorder, current episode mixed, moderate: Secondary | ICD-10-CM

## 2017-07-31 MED ORDER — ARIPIPRAZOLE 10 MG PO TABS: ORAL_TABLET | ORAL | 2 refills | Status: DC

## 2017-07-31 MED ORDER — AMPHETAMINE-DEXTROAMPHET ER 20 MG PO CP24: 20.0000 mg | ORAL_CAPSULE | Freq: Two times a day (BID) | ORAL | 0 refills | Status: DC

## 2017-07-31 MED ORDER — DULOXETINE HCL 60 MG PO CPEP: 60.0000 mg | ORAL_CAPSULE | Freq: Two times a day (BID) | ORAL | 2 refills | Status: DC

## 2017-07-31 MED ORDER — BUPROPION HCL 75 MG PO TABS: 75.0000 mg | ORAL_TABLET | ORAL | 2 refills | Status: DC

## 2017-07-31 MED ORDER — ALPRAZOLAM 1 MG PO TABS: 1.0000 mg | ORAL_TABLET | Freq: Three times a day (TID) | ORAL | 2 refills | Status: DC | PRN

## 2017-07-31 MED ORDER — TEMAZEPAM 15 MG PO CAPS: 15.0000 mg | ORAL_CAPSULE | Freq: Every evening | ORAL | 2 refills | Status: DC | PRN
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
## 2017-08-28 ENCOUNTER — Ambulatory Visit (HOSPITAL_COMMUNITY): Payer: Medicaid Other | Admitting: Psychiatry

## 2017-09-05 ENCOUNTER — Telehealth (HOSPITAL_COMMUNITY): Payer: Self-pay | Admitting: *Deleted

## 2017-09-05 NOTE — Telephone Encounter (Signed)
Pt pharmacy faxed office to complete P.A request and the name of the medication was not on fax. Pharmacy provided information for office to complete PA on Cover my Meds. Key is: [facility], LAST NAME: Hiltunen and DOB is 01-16-1976. Per pt chart, pt pharmacy is [facility] in [place]. Message will be sent to [facility].

## 2017-09-23 ENCOUNTER — Telehealth (HOSPITAL_COMMUNITY): Payer: Self-pay | Admitting: *Deleted

## 2017-09-23 NOTE — Telephone Encounter (Signed)
Pt called stating she fell and broke her leg and ankle and had to cancel her appt with provider.Per pt she would like to know if provider could prescribe her enough medication to last her until the doctor tells her she can move around again. Per pt she will have to call back and resch appt. Pt number is [phone] and [phone].

## 2017-09-24 ENCOUNTER — Ambulatory Visit (HOSPITAL_COMMUNITY): Payer: Medicaid Other | Admitting: Psychiatry

## 2017-09-25 ENCOUNTER — Encounter (HOSPITAL_COMMUNITY): Payer: Self-pay | Admitting: Psychiatry

## 2017-09-25 NOTE — Telephone Encounter (Signed)
Pt would like to have her Adderall filled

## 2017-09-25 NOTE — Telephone Encounter (Signed)
Per chart review, she has medication filled by DR. Ross for three months in August. Only the medication she will be out is adderall, but it has to be picked up by the patient or family member. Please ask her what she hopes to do. If she is not able to come pick up, would advise tapering off Adderall (from 20 mg twice a day to 20 mg daily) to prevent withdrawal symptoms.

## 2017-09-25 NOTE — Telephone Encounter (Signed)
Pt called stating she fell and broke her leg and ankle and had to cancel her appt with provider.Per pt she would like to know if provider could prescribe her enough medication to last her until the doctor tells her she can move around again. Per pt she will have to call back and resch appt. Pt number is [phone] and [phone]. 

## 2017-09-26 ENCOUNTER — Encounter (HOSPITAL_COMMUNITY): Payer: Self-pay | Admitting: Psychiatry

## 2017-09-26 NOTE — Telephone Encounter (Signed)
This encounter was created in error - please disregard.

## 2017-09-26 NOTE — Telephone Encounter (Signed)
Per PMP registration, Adderall was last filled on 09/03/2017 for 30 days. Will defer this to Dr. Tenny Craw who will be back to the office next week.

## 2017-09-29 ENCOUNTER — Other Ambulatory Visit (HOSPITAL_COMMUNITY): Payer: Self-pay | Admitting: Psychiatry

## 2017-09-29 MED ORDER — AMPHETAMINE-DEXTROAMPHET ER 20 MG PO CP24: 20.0000 mg | ORAL_CAPSULE | Freq: Two times a day (BID) | ORAL | 0 refills | Status: DC

## 2017-09-29 NOTE — Telephone Encounter (Signed)
filled

## 2017-09-29 NOTE — Telephone Encounter (Signed)
Per pt she will come by office to pick up her printed script on Friday Oct 12th

## 2017-09-29 NOTE — Telephone Encounter (Signed)
printed

## 2017-10-13 ENCOUNTER — Telehealth (HOSPITAL_COMMUNITY): Payer: Self-pay | Admitting: *Deleted

## 2017-10-13 NOTE — Telephone Encounter (Signed)
Pt came into office to pick up her printed script for her Addreall XR 20 mg. Per script ID number is [id number] and order ID number ID [id number]. Pt D/L [phone] and expr date is 11-02-19. Pt agreed with script

## 2017-10-30 ENCOUNTER — Encounter (HOSPITAL_COMMUNITY): Payer: Self-pay | Admitting: Psychiatry

## 2017-10-30 ENCOUNTER — Ambulatory Visit (HOSPITAL_COMMUNITY): Payer: Medicaid Other | Admitting: Psychiatry

## 2017-10-30 VITALS — BP 102/66 | HR 109 | Ht 62.0 in | Wt 186.0 lb

## 2017-10-30 DIAGNOSIS — F3162 Bipolar disorder, current episode mixed, moderate: Secondary | ICD-10-CM

## 2017-10-30 MED ORDER — AMPHETAMINE-DEXTROAMPHET ER 20 MG PO CP24: 20.0000 mg | ORAL_CAPSULE | Freq: Two times a day (BID) | ORAL | 0 refills | Status: DC

## 2017-10-30 MED ORDER — TEMAZEPAM 15 MG PO CAPS
15.0000 mg | ORAL_CAPSULE | Freq: Every evening | ORAL | 2 refills | Status: DC | PRN
Start: 2017-10-30 — End: 2017-12-26

## 2017-10-30 MED ORDER — ALPRAZOLAM 1 MG PO TABS: 1.0000 mg | ORAL_TABLET | Freq: Three times a day (TID) | ORAL | 2 refills | Status: DC | PRN

## 2017-10-30 MED ORDER — DULOXETINE HCL 60 MG PO CPEP: 60.0000 mg | ORAL_CAPSULE | Freq: Two times a day (BID) | ORAL | 2 refills | Status: DC

## 2017-10-30 MED ORDER — ARIPIPRAZOLE 15 MG PO TABS
ORAL_TABLET | ORAL | 2 refills | Status: DC
Start: 2017-10-30 — End: 2017-12-26
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
## 2017-11-11 ENCOUNTER — Telehealth (HOSPITAL_COMMUNITY): Payer: Self-pay | Admitting: *Deleted

## 2017-11-11 NOTE — Telephone Encounter (Signed)
Prior authorization for Temazepam received. Filled out form from online with [place] [facility] and faxed to office staff for MD signature then to fax to [facility] for decision.

## 2017-11-12 NOTE — Telephone Encounter (Signed)
Faxed has been received  [facility] & signed by Dr Tenny Crawoss. Faxed to [facility] [employer]

## 2017-12-25 ENCOUNTER — Ambulatory Visit (HOSPITAL_COMMUNITY): Payer: Medicaid Other | Admitting: Psychiatry

## 2017-12-26 ENCOUNTER — Ambulatory Visit (INDEPENDENT_AMBULATORY_CARE_PROVIDER_SITE_OTHER): Payer: Medicaid Other | Admitting: Psychiatry

## 2017-12-26 ENCOUNTER — Encounter (HOSPITAL_COMMUNITY): Payer: Self-pay | Admitting: Psychiatry

## 2017-12-26 VITALS — BP 119/83 | HR 98 | Ht 62.0 in | Wt 191.0 lb

## 2017-12-26 DIAGNOSIS — Z818 Family history of other mental and behavioral disorders: Secondary | ICD-10-CM

## 2017-12-26 DIAGNOSIS — Z736 Limitation of activities due to disability: Secondary | ICD-10-CM | POA: Diagnosis not present

## 2017-12-26 DIAGNOSIS — F3162 Bipolar disorder, current episode mixed, moderate: Secondary | ICD-10-CM | POA: Diagnosis not present

## 2017-12-26 DIAGNOSIS — Z811 Family history of alcohol abuse and dependence: Secondary | ICD-10-CM

## 2017-12-26 MED ORDER — ARIPIPRAZOLE 10 MG PO TABS: 10.0000 mg | ORAL_TABLET | Freq: Two times a day (BID) | ORAL | 2 refills | Status: DC

## 2017-12-26 MED ORDER — TEMAZEPAM 15 MG PO CAPS: 15.0000 mg | ORAL_CAPSULE | Freq: Every evening | ORAL | 2 refills | Status: DC | PRN

## 2017-12-26 MED ORDER — ALPRAZOLAM 1 MG PO TABS: 1.0000 mg | ORAL_TABLET | Freq: Three times a day (TID) | ORAL | 2 refills | Status: DC | PRN

## 2017-12-26 MED ORDER — AMPHETAMINE-DEXTROAMPHET ER 20 MG PO CP24: 20.0000 mg | ORAL_CAPSULE | Freq: Two times a day (BID) | ORAL | 0 refills | Status: DC

## 2017-12-26 MED ORDER — VILAZODONE HCL 40 MG PO TABS: 40.0000 mg | ORAL_TABLET | Freq: Every day | ORAL | 2 refills | Status: DC
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
## 2017-12-31 ENCOUNTER — Telehealth (HOSPITAL_COMMUNITY): Payer: Self-pay | Admitting: *Deleted

## 2017-12-31 NOTE — Telephone Encounter (Signed)
Prior authorization for Viibryd received. Called [facility] [employer] spoke with Vincenza HewsQuinn who gave approval [id number] until 12/26/18.  Called to notify pharmacy.

## 2018-01-02 ENCOUNTER — Telehealth (HOSPITAL_COMMUNITY): Payer: Self-pay | Admitting: *Deleted

## 2018-01-02 NOTE — Telephone Encounter (Signed)
That will be ok but she should only take cymbalta 60 mg ONE a day with the viibryd

## 2018-01-02 NOTE — Telephone Encounter (Signed)
Dr Tenny Crawoss Patient called in stating that on 12/26/17 appointment you put on Viibyrd. And that she has been taking that Along with her Cymbalta & that it's working & she feels so much better. She is requesting a call back because  She wants to continue Cymbalta & Viibyrd # [phone]

## 2018-01-05 NOTE — Telephone Encounter (Signed)
Spoke with patient & informed per Dr. Tenny Crawoss: she should only take cymbalta 60 mg ONE a day with the viibryd

## 2018-01-26 ENCOUNTER — Ambulatory Visit (HOSPITAL_COMMUNITY): Payer: Self-pay | Admitting: Psychiatry

## 2018-01-27 ENCOUNTER — Ambulatory Visit (HOSPITAL_COMMUNITY): Payer: Self-pay | Admitting: Psychiatry

## 2018-01-28 ENCOUNTER — Ambulatory Visit (HOSPITAL_COMMUNITY): Payer: Medicaid Other | Admitting: Psychiatry

## 2018-01-28 ENCOUNTER — Encounter (HOSPITAL_COMMUNITY): Payer: Self-pay | Admitting: Psychiatry

## 2018-01-28 VITALS — BP 127/85 | HR 101 | Ht 62.0 in | Wt 195.0 lb

## 2018-01-28 DIAGNOSIS — F909 Attention-deficit hyperactivity disorder, unspecified type: Secondary | ICD-10-CM | POA: Diagnosis not present

## 2018-01-28 DIAGNOSIS — Z560 Unemployment, unspecified: Secondary | ICD-10-CM

## 2018-01-28 DIAGNOSIS — Z634 Disappearance and death of family member: Secondary | ICD-10-CM

## 2018-01-28 DIAGNOSIS — F419 Anxiety disorder, unspecified: Secondary | ICD-10-CM

## 2018-01-28 DIAGNOSIS — Z818 Family history of other mental and behavioral disorders: Secondary | ICD-10-CM | POA: Diagnosis not present

## 2018-01-28 DIAGNOSIS — R51 Headache: Secondary | ICD-10-CM | POA: Diagnosis not present

## 2018-01-28 DIAGNOSIS — Z91411 Personal history of adult psychological abuse: Secondary | ICD-10-CM | POA: Diagnosis not present

## 2018-01-28 DIAGNOSIS — F3162 Bipolar disorder, current episode mixed, moderate: Secondary | ICD-10-CM | POA: Diagnosis not present

## 2018-01-28 DIAGNOSIS — Z91410 Personal history of adult physical and sexual abuse: Secondary | ICD-10-CM

## 2018-01-28 DIAGNOSIS — F17210 Nicotine dependence, cigarettes, uncomplicated: Secondary | ICD-10-CM

## 2018-01-28 DIAGNOSIS — Z811 Family history of alcohol abuse and dependence: Secondary | ICD-10-CM | POA: Diagnosis not present

## 2018-01-28 DIAGNOSIS — Z736 Limitation of activities due to disability: Secondary | ICD-10-CM | POA: Diagnosis not present

## 2018-01-28 MED ORDER — DULOXETINE HCL 60 MG PO CPEP: 60.0000 mg | ORAL_CAPSULE | Freq: Every day | ORAL | 2 refills | Status: DC

## 2018-01-28 MED ORDER — AMPHETAMINE-DEXTROAMPHET ER 20 MG PO CP24
20.0000 mg | ORAL_CAPSULE | Freq: Two times a day (BID) | ORAL | 0 refills | Status: DC
Start: 2018-01-28 — End: 2018-07-14

## 2018-01-28 MED ORDER — ARIPIPRAZOLE 10 MG PO TABS: 10.0000 mg | ORAL_TABLET | Freq: Every day | ORAL | 2 refills | Status: DC

## 2018-01-28 MED ORDER — ALPRAZOLAM 1 MG PO TABS: 1.0000 mg | ORAL_TABLET | Freq: Three times a day (TID) | ORAL | 2 refills | Status: DC | PRN

## 2018-01-28 MED ORDER — AMPHETAMINE-DEXTROAMPHET ER 20 MG PO CP24: 20.0000 mg | ORAL_CAPSULE | Freq: Two times a day (BID) | ORAL | 0 refills | Status: DC

## 2018-01-28 MED ORDER — VILAZODONE HCL 40 MG PO TABS: 40.0000 mg | ORAL_TABLET | Freq: Every day | ORAL | 2 refills | Status: DC
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
## 2018-03-30 ENCOUNTER — Other Ambulatory Visit (HOSPITAL_COMMUNITY): Payer: Self-pay | Admitting: Psychiatry

## 2018-03-30 MED ORDER — ARIPIPRAZOLE 10 MG PO TABS: 10.0000 mg | ORAL_TABLET | Freq: Every day | ORAL | 2 refills | Status: DC

## 2018-03-30 MED ORDER — DULOXETINE HCL 60 MG PO CPEP: 60.0000 mg | ORAL_CAPSULE | Freq: Every day | ORAL | 2 refills | Status: DC

## 2018-03-30 MED ORDER — VILAZODONE HCL 40 MG PO TABS: 40.0000 mg | ORAL_TABLET | Freq: Every day | ORAL | 2 refills | Status: DC

## 2018-04-27 ENCOUNTER — Ambulatory Visit (INDEPENDENT_AMBULATORY_CARE_PROVIDER_SITE_OTHER): Payer: Medicaid Other | Admitting: Psychiatry

## 2018-04-27 ENCOUNTER — Encounter (HOSPITAL_COMMUNITY): Payer: Self-pay | Admitting: Psychiatry

## 2018-04-27 VITALS — BP 116/80 | HR 94 | Ht 62.25 in | Wt 198.0 lb

## 2018-04-27 DIAGNOSIS — Z818 Family history of other mental and behavioral disorders: Secondary | ICD-10-CM

## 2018-04-27 DIAGNOSIS — F3162 Bipolar disorder, current episode mixed, moderate: Secondary | ICD-10-CM | POA: Diagnosis not present

## 2018-04-27 DIAGNOSIS — Z811 Family history of alcohol abuse and dependence: Secondary | ICD-10-CM

## 2018-04-27 DIAGNOSIS — Z91410 Personal history of adult physical and sexual abuse: Secondary | ICD-10-CM | POA: Diagnosis not present

## 2018-04-27 DIAGNOSIS — R450 Nervousness: Secondary | ICD-10-CM | POA: Diagnosis not present

## 2018-04-27 DIAGNOSIS — G4700 Insomnia, unspecified: Secondary | ICD-10-CM | POA: Diagnosis not present

## 2018-04-27 DIAGNOSIS — F419 Anxiety disorder, unspecified: Secondary | ICD-10-CM | POA: Diagnosis not present

## 2018-04-27 DIAGNOSIS — Z736 Limitation of activities due to disability: Secondary | ICD-10-CM | POA: Diagnosis not present

## 2018-04-27 DIAGNOSIS — Z560 Unemployment, unspecified: Secondary | ICD-10-CM | POA: Diagnosis not present

## 2018-04-27 DIAGNOSIS — F17210 Nicotine dependence, cigarettes, uncomplicated: Secondary | ICD-10-CM

## 2018-04-27 MED ORDER — DULOXETINE HCL 60 MG PO CPEP: 60.0000 mg | ORAL_CAPSULE | Freq: Every day | ORAL | 2 refills | Status: DC

## 2018-04-27 MED ORDER — VILAZODONE HCL 40 MG PO TABS: 40.0000 mg | ORAL_TABLET | Freq: Every day | ORAL | 2 refills | Status: DC

## 2018-04-27 MED ORDER — TEMAZEPAM 15 MG PO CAPS: 15.0000 mg | ORAL_CAPSULE | Freq: Every evening | ORAL | 2 refills | Status: DC | PRN

## 2018-04-27 MED ORDER — ALPRAZOLAM 1 MG PO TABS: 1.0000 mg | ORAL_TABLET | Freq: Three times a day (TID) | ORAL | 2 refills | Status: DC | PRN

## 2018-04-27 MED ORDER — AMPHETAMINE-DEXTROAMPHET ER 20 MG PO CP24: 20.0000 mg | ORAL_CAPSULE | Freq: Two times a day (BID) | ORAL | 0 refills | Status: DC

## 2018-04-27 MED ORDER — ARIPIPRAZOLE 10 MG PO TABS: 10.0000 mg | ORAL_TABLET | Freq: Every day | ORAL | 2 refills | Status: DC
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
## 2018-05-14 ENCOUNTER — Telehealth (HOSPITAL_COMMUNITY): Payer: Self-pay | Admitting: *Deleted

## 2018-05-14 NOTE — Telephone Encounter (Signed)
Dr Tenny Craw Patient called stating that the Abilify Cymbalta & Viibyrd aren't helping her.Next office visit 06/10/18

## 2018-05-14 NOTE — Telephone Encounter (Signed)
Patient is rescheduling appointment with Lupita Leash

## 2018-05-14 NOTE — Telephone Encounter (Signed)
You can try to get her in sooner

## 2018-05-15 ENCOUNTER — Ambulatory Visit (INDEPENDENT_AMBULATORY_CARE_PROVIDER_SITE_OTHER): Payer: Medicaid Other | Admitting: Psychiatry

## 2018-05-15 ENCOUNTER — Encounter (HOSPITAL_COMMUNITY): Payer: Self-pay | Admitting: Psychiatry

## 2018-05-15 VITALS — BP 126/89 | HR 109 | Ht 62.0 in | Wt 195.0 lb

## 2018-05-15 DIAGNOSIS — Z818 Family history of other mental and behavioral disorders: Secondary | ICD-10-CM | POA: Diagnosis not present

## 2018-05-15 DIAGNOSIS — F17210 Nicotine dependence, cigarettes, uncomplicated: Secondary | ICD-10-CM

## 2018-05-15 DIAGNOSIS — F70 Mild intellectual disabilities: Secondary | ICD-10-CM | POA: Diagnosis not present

## 2018-05-15 DIAGNOSIS — Z736 Limitation of activities due to disability: Secondary | ICD-10-CM | POA: Diagnosis not present

## 2018-05-15 DIAGNOSIS — Z811 Family history of alcohol abuse and dependence: Secondary | ICD-10-CM

## 2018-05-15 DIAGNOSIS — F419 Anxiety disorder, unspecified: Secondary | ICD-10-CM | POA: Diagnosis not present

## 2018-05-15 DIAGNOSIS — R450 Nervousness: Secondary | ICD-10-CM | POA: Diagnosis not present

## 2018-05-15 DIAGNOSIS — F3162 Bipolar disorder, current episode mixed, moderate: Secondary | ICD-10-CM | POA: Diagnosis not present

## 2018-05-15 MED ORDER — DULOXETINE HCL 60 MG PO CPEP: 60.0000 mg | ORAL_CAPSULE | Freq: Two times a day (BID) | ORAL | 2 refills | Status: DC
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
## 2018-06-10 ENCOUNTER — Ambulatory Visit (HOSPITAL_COMMUNITY): Payer: Self-pay | Admitting: Psychiatry

## 2018-06-23 ENCOUNTER — Ambulatory Visit (HOSPITAL_COMMUNITY): Payer: Medicaid Other | Admitting: Psychiatry

## 2018-06-24 ENCOUNTER — Ambulatory Visit (HOSPITAL_COMMUNITY): Payer: Medicaid Other | Admitting: Psychiatry

## 2018-07-03 ENCOUNTER — Telehealth (HOSPITAL_COMMUNITY): Payer: Self-pay | Admitting: *Deleted

## 2018-07-03 NOTE — Telephone Encounter (Signed)
Patient has appointment on 08/17/18 will discuss weight gain & changing Viibyrd

## 2018-07-03 NOTE — Telephone Encounter (Signed)
Dr Tenny Crawoss Patient called & stated that  she has gained a significant amount of weight since starting the Viibyrd. And would like to know is there something that could be taken in place that wouldn't cause weight gain? Patient has seen a all natural add for weight loss using product called Almased. Would like to know if you had any opinon on the use? PCP told her NO not recommended with her med's

## 2018-07-03 NOTE — Telephone Encounter (Signed)
I don't know anything about the diet med, but I wouldn't recommend it.  I don't change meds over the phone, she will need to come in

## 2018-07-14 ENCOUNTER — Ambulatory Visit (INDEPENDENT_AMBULATORY_CARE_PROVIDER_SITE_OTHER): Payer: Medicaid Other | Admitting: Psychiatry

## 2018-07-14 ENCOUNTER — Encounter (HOSPITAL_COMMUNITY): Payer: Self-pay | Admitting: Psychiatry

## 2018-07-14 VITALS — BP 132/85 | HR 109 | Ht 62.0 in | Wt 205.0 lb

## 2018-07-14 DIAGNOSIS — F3162 Bipolar disorder, current episode mixed, moderate: Secondary | ICD-10-CM | POA: Diagnosis not present

## 2018-07-14 MED ORDER — AMPHETAMINE-DEXTROAMPHET ER 20 MG PO CP24: 20.0000 mg | ORAL_CAPSULE | Freq: Two times a day (BID) | ORAL | 0 refills | Status: DC

## 2018-07-14 MED ORDER — ALPRAZOLAM 1 MG PO TABS: 1.0000 mg | ORAL_TABLET | Freq: Three times a day (TID) | ORAL | 2 refills | Status: DC | PRN

## 2018-07-14 MED ORDER — DULOXETINE HCL 60 MG PO CPEP: 60.0000 mg | ORAL_CAPSULE | Freq: Two times a day (BID) | ORAL | 2 refills | Status: DC

## 2018-07-14 MED ORDER — VILAZODONE HCL 40 MG PO TABS: 40.0000 mg | ORAL_TABLET | Freq: Every day | ORAL | 2 refills | Status: DC

## 2018-07-14 MED ORDER — ARIPIPRAZOLE 10 MG PO TABS: 10.0000 mg | ORAL_TABLET | Freq: Every day | ORAL | 2 refills | Status: DC
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
## 2018-08-04 ENCOUNTER — Other Ambulatory Visit (HOSPITAL_COMMUNITY): Payer: Self-pay | Admitting: Respiratory Therapy

## 2018-08-04 DIAGNOSIS — J441 Chronic obstructive pulmonary disease with (acute) exacerbation: Secondary | ICD-10-CM

## 2018-08-17 ENCOUNTER — Other Ambulatory Visit (HOSPITAL_BASED_OUTPATIENT_CLINIC_OR_DEPARTMENT_OTHER): Payer: Self-pay

## 2018-08-17 DIAGNOSIS — G4710 Hypersomnia, unspecified: Secondary | ICD-10-CM

## 2018-08-17 DIAGNOSIS — R5383 Other fatigue: Secondary | ICD-10-CM

## 2018-08-17 DIAGNOSIS — R0683 Snoring: Secondary | ICD-10-CM

## 2018-08-18 ENCOUNTER — Ambulatory Visit (HOSPITAL_COMMUNITY)
Admission: RE | Admit: 2018-08-18 | Discharge: 2018-08-18 | Disposition: A | Discharge status: Home / Self Care | Payer: Medicaid Other | Source: Ambulatory Visit | Attending: Pulmonary Disease | Admitting: Pulmonary Disease

## 2018-08-18 DIAGNOSIS — J441 Chronic obstructive pulmonary disease with (acute) exacerbation: Secondary | ICD-10-CM | POA: Diagnosis not present

## 2018-08-18 LAB — PULMONARY FUNCTION TEST
DL/VA % pred: 84 %
DL/VA: 3.93 ml/min/mmHg/L
DLCO unc % pred: 66 %
DLCO unc: 15.25 ml/min/mmHg
FEF 25-75 Post: 2.79 L/sec
FEF 25-75 Pre: 3.17 L/sec
FEF2575-%Change-Post: -12 %
FEF2575-%Pred-Post: 94 %
FEF2575-%Pred-Pre: 107 %
FEV1-%Change-Post: -4 %
FEV1-%Pred-Post: 84 %
FEV1-%Pred-Pre: 88 %
FEV1-Post: 2.41 L
FEV1-Pre: 2.52 L
FEV1FVC-%Change-Post: 1 %
FEV1FVC-%Pred-Pre: 106 %
FEV6-%Change-Post: -6 %
FEV6-%Pred-Post: 78 %
FEV6-%Pred-Pre: 84 %
FEV6-Post: 2.72 L
FEV6-Pre: 2.9 L
FEV6FVC-%Pred-Post: 102 %
FEV6FVC-%Pred-Pre: 102 %
FVC-%Change-Post: -6 %
FVC-%Pred-Post: 77 %
FVC-%Pred-Pre: 82 %
FVC-Post: 2.72 L
FVC-Pre: 2.9 L
Post FEV1/FVC ratio: 88 %
Post FEV6/FVC ratio: 100 %
Pre FEV1/FVC ratio: 87 %
Pre FEV6/FVC Ratio: 100 %
RV % pred: 80 %
RV: 1.3 L
TLC % pred: 87 %
TLC: 4.27 L

## 2018-08-18 MED ORDER — ALBUTEROL SULFATE (2.5 MG/3ML) 0.083% IN NEBU
2.5000 mg | INHALATION_SOLUTION | Freq: Once | RESPIRATORY_TRACT | Status: AC
  Administered 2018-08-18: 2.5 mg via RESPIRATORY_TRACT

## 2018-08-20 ENCOUNTER — Other Ambulatory Visit (HOSPITAL_COMMUNITY): Payer: Self-pay | Admitting: Neurology

## 2018-08-20 ENCOUNTER — Ambulatory Visit (HOSPITAL_COMMUNITY)
Admission: RE | Admit: 2018-08-20 | Discharge: 2018-08-20 | Disposition: A | Discharge status: Home / Self Care | Payer: Medicaid Other | Source: Ambulatory Visit | Attending: Neurology | Admitting: Neurology

## 2018-08-20 DIAGNOSIS — M4802 Spinal stenosis, cervical region: Secondary | ICD-10-CM | POA: Insufficient documentation

## 2018-08-20 DIAGNOSIS — M5010 Cervical disc disorder with radiculopathy, unspecified cervical region: Secondary | ICD-10-CM | POA: Insufficient documentation

## 2018-08-20 DIAGNOSIS — M5412 Radiculopathy, cervical region: Secondary | ICD-10-CM | POA: Diagnosis present

## 2018-08-26 ENCOUNTER — Other Ambulatory Visit (HOSPITAL_COMMUNITY): Payer: Self-pay | Admitting: Neurology

## 2018-08-26 DIAGNOSIS — M5012 Mid-cervical disc disorder, unspecified level: Secondary | ICD-10-CM

## 2018-08-28 ENCOUNTER — Ambulatory Visit: Payer: Medicaid Other | Attending: Pulmonary Disease | Admitting: Neurology

## 2018-08-28 DIAGNOSIS — G4736 Sleep related hypoventilation in conditions classified elsewhere: Secondary | ICD-10-CM | POA: Insufficient documentation

## 2018-08-28 DIAGNOSIS — G4733 Obstructive sleep apnea (adult) (pediatric): Secondary | ICD-10-CM | POA: Insufficient documentation

## 2018-08-28 DIAGNOSIS — G4710 Hypersomnia, unspecified: Secondary | ICD-10-CM | POA: Diagnosis present

## 2018-08-28 DIAGNOSIS — Z79899 Other long term (current) drug therapy: Secondary | ICD-10-CM | POA: Insufficient documentation

## 2018-08-28 DIAGNOSIS — R0683 Snoring: Secondary | ICD-10-CM | POA: Diagnosis present

## 2018-08-28 DIAGNOSIS — R5383 Other fatigue: Secondary | ICD-10-CM | POA: Diagnosis present

## 2018-09-01 ENCOUNTER — Ambulatory Visit (HOSPITAL_COMMUNITY)
Admission: RE | Admit: 2018-09-01 | Discharge: 2018-09-01 | Disposition: A | Discharge status: Home / Self Care | Payer: Medicaid Other | Source: Ambulatory Visit | Attending: Neurology | Admitting: Neurology

## 2018-09-01 DIAGNOSIS — M47812 Spondylosis without myelopathy or radiculopathy, cervical region: Secondary | ICD-10-CM | POA: Diagnosis not present

## 2018-09-01 DIAGNOSIS — M2578 Osteophyte, vertebrae: Secondary | ICD-10-CM | POA: Diagnosis not present

## 2018-09-01 DIAGNOSIS — M5012 Mid-cervical disc disorder, unspecified level: Secondary | ICD-10-CM

## 2018-09-03 NOTE — Procedures (Signed)
[place] A. Merlene Laughter, MD     [email or web address]             NOCTURNAL POLYSOMNOGRAPHY   LOCATION: [facility]   Patient Name: Catherine Howard, Catherine Howard Date: 08/28/2018 Gender: Female D.O.B: June 16, 1976 Age (years): 41 Referring Provider: Sinda Du Height (inches): 63 Interpreting Physician: Phillips Odor MD, ABSM Weight (lbs): 202 RPSGT: Peak, Robert BMI: 36 MRN: [id number] Neck Size: 16.50 <br> <br> CLINICAL INFORMATION Sleep Study Type: Split Night CPAP    Indication for sleep study: Excessive Daytime Sleepiness, Fatigue, Snoring    Epworth Sleepiness Score: 14  SLEEP STUDY TECHNIQUE As per the AASM Manual for the Scoring of Sleep and Associated Events v2.3 (April 2016) with a hypopnea requiring 4% desaturations.  The channels recorded and monitored were frontal, central and occipital EEG, electrooculogram (EOG), submentalis EMG (chin), nasal and oral airflow, thoracic and abdominal wall motion, anterior tibialis EMG, snore microphone, electrocardiogram, and pulse oximetry. Continuous positive airway pressure (CPAP) was initiated when the patient met split night criteria and was titrated according to treat sleep-disordered breathing.  MEDICATIONS Medications self-administered by patient taken the night of the study : N/A  Current Outpatient Medications:  .  ALPRAZolam (XANAX) 1 MG tablet, Take 1 tablet (1 mg total) by mouth 3 (three) times daily as needed for anxiety., Disp: 90 tablet, Rfl: 2 .  amphetamine-dextroamphetamine (ADDERALL XR) 20 MG 24 hr capsule, Take 1 capsule (20 mg total) by mouth 2 (two) times daily., Disp: 60 capsule, Rfl: 0 .  amphetamine-dextroamphetamine (ADDERALL XR) 20 MG 24 hr capsule, Take 1 capsule (20 mg total) by mouth 2 (two) times daily., Disp: 60 capsule, Rfl: 0 .  amphetamine-dextroamphetamine (ADDERALL XR) 20 MG 24 hr capsule, Take 1 capsule (20 mg total) by mouth 2 (two) times daily., Disp: 60 capsule, Rfl:  0 .  ARIPiprazole (ABILIFY) 10 MG tablet, Take 1 tablet (10 mg total) by mouth daily., Disp: 30 tablet, Rfl: 2 .  atorvastatin (LIPITOR) 20 MG tablet, Take 20 mg by mouth daily., Disp: , Rfl:  .  Cholecalciferol (VITAMIN D) 2000 UNITS tablet, Take 2,000 Units by mouth daily., Disp: , Rfl:  .  DULoxetine (CYMBALTA) 60 MG capsule, Take 1 capsule (60 mg total) by mouth 2 (two) times daily., Disp: 60 capsule, Rfl: 2 .  metFORMIN (GLUCOPHAGE) 500 MG tablet, Take 500 mg by mouth 2 (two) times daily with a meal., Disp: , Rfl:  .  propranolol (INDERAL) 10 MG tablet, Take 10 mg by mouth 2 (two) times daily., Disp: , Rfl:  .  Vilazodone HCl (VIIBRYD) 40 MG TABS, Take 1 tablet (40 mg total) by mouth daily., Disp: 30 tablet, Rfl: 2  RESPIRATORY PARAMETERS There are prolonged time periods of the desaturations which occurred during the diagnostic and titration phases not associated with the hypoxic or apneic events suggestive of hypoventilation syndrome.  Diagnostic   Total AHI (/hr): 16.6 RDI (/hr): 47.1 OA Index (/hr): 3.6 CA Index (/hr): 0.0 REM AHI (/hr): 58.6 NREM AHI (/hr): 9.7 Supine AHI (/hr): 7.2 Non-supine AHI (/hr): 25.7 Min O2 Sat (%): 82.0 Mean O2 (%): 89.7 Time below 88% (min): 67.3   Titration  Optimal Pressure (cm): 11 AHI at Optimal Pressure (/hr): 1.9 Min O2 at Optimal Pressure (%): 83.0 Supine % at Optimal (%): 0 Sleep % at Optimal (%): 87   SLEEP ARCHITECTURE The recording time for the entire night was 432.9 minutes.  During a baseline period of 169.0 minutes, the patient slept for 151.5 minutes in  REM and nonREM, yielding a sleep efficiency of 89.6%%. Sleep onset after lights out was 7.8 minutes with a REM latency of 122.5 minutes. The patient spent 11.2%% of the night in stage N1 sleep, 74.6%% in stage N2 sleep, 0.0%% in stage N3 and 14.2% in REM.    During the titration period of 261.6 minutes, the patient slept for 215.6 minutes in REM and nonREM, yielding a sleep efficiency  of 82.4%%. Sleep onset after CPAP initiation was 7.0 minutes with a REM latency of 37.5 minutes. The patient spent 7.7%% of the night in stage N1 sleep, 45.7%% in stage N2 sleep, 0.0%% in stage N3 and 46.6% in REM.  CARDIAC DATA The 2 lead EKG demonstrated sinus rhythm. The mean heart rate was 100.0 beats per minute. Other EKG findings include: None. LEG MOVEMENT DATA The total Periodic Limb Movements of Sleep (PLMS) were 0. The PLMS index was 0.0.  IMPRESSIONS - Moderate obstructive sleep apnea worse during REM sleep occurred during the diagnostic portion of the study(AHI = 16.6/hour). The optimal CPAP selected for this patient is ( 11 cm of water).  - There is also evidence of hypoventilation syndrome. The patient should have nocturnal oximetry done on CPAP 1 month after treatment.   Delano Metz, MD Diplomate, American Board of Sleep Medicine. ELECTRONICALLY SIGNED ON:  09/03/2018, 5:25 PM [place] PH: [phone]   FX: [phone] [place]

## 2018-09-10 ENCOUNTER — Other Ambulatory Visit: Payer: Self-pay | Admitting: Neurology

## 2018-09-10 DIAGNOSIS — M5412 Radiculopathy, cervical region: Secondary | ICD-10-CM

## 2018-09-23 ENCOUNTER — Ambulatory Visit
Admission: RE | Admit: 2018-09-23 | Discharge: 2018-09-23 | Disposition: A | Discharge status: Home / Self Care | Payer: Medicaid Other | Source: Ambulatory Visit | Attending: Neurology | Admitting: Neurology

## 2018-09-23 DIAGNOSIS — M5412 Radiculopathy, cervical region: Secondary | ICD-10-CM

## 2018-09-23 MED ORDER — IOPAMIDOL (ISOVUE-M 300) INJECTION 61%
1.0000 mL | Freq: Once | INTRAMUSCULAR | Status: AC | PRN
  Administered 2018-09-23: 1 mL via EPIDURAL

## 2018-09-23 MED ORDER — TRIAMCINOLONE ACETONIDE 40 MG/ML IJ SUSP (RADIOLOGY)
60.0000 mg | Freq: Once | INTRAMUSCULAR | Status: AC
  Administered 2018-09-23: 60 mg via EPIDURAL

## 2018-09-23 NOTE — Discharge Instructions (Signed)

## 2018-09-28 ENCOUNTER — Other Ambulatory Visit (HOSPITAL_COMMUNITY): Payer: Self-pay | Admitting: Emergency Medicine

## 2018-09-28 DIAGNOSIS — Z1231 Encounter for screening mammogram for malignant neoplasm of breast: Secondary | ICD-10-CM

## 2018-09-30 ENCOUNTER — Other Ambulatory Visit (HOSPITAL_COMMUNITY): Payer: Self-pay | Admitting: Psychiatry

## 2018-10-14 ENCOUNTER — Ambulatory Visit (INDEPENDENT_AMBULATORY_CARE_PROVIDER_SITE_OTHER): Payer: Medicaid Other | Admitting: Psychiatry

## 2018-10-14 ENCOUNTER — Encounter (HOSPITAL_COMMUNITY): Payer: Self-pay | Admitting: Psychiatry

## 2018-10-14 VITALS — BP 123/87 | HR 96 | Ht 62.0 in | Wt 202.0 lb

## 2018-10-14 DIAGNOSIS — F3162 Bipolar disorder, current episode mixed, moderate: Secondary | ICD-10-CM

## 2018-10-14 DIAGNOSIS — F902 Attention-deficit hyperactivity disorder, combined type: Secondary | ICD-10-CM

## 2018-10-14 MED ORDER — DULOXETINE HCL 60 MG PO CPEP: 60.0000 mg | ORAL_CAPSULE | Freq: Two times a day (BID) | ORAL | 2 refills | Status: DC

## 2018-10-14 MED ORDER — ALPRAZOLAM 1 MG PO TABS: 1.0000 mg | ORAL_TABLET | Freq: Three times a day (TID) | ORAL | 2 refills | Status: DC | PRN

## 2018-10-14 MED ORDER — LISDEXAMFETAMINE DIMESYLATE 60 MG PO CAPS: 60.0000 mg | ORAL_CAPSULE | ORAL | 0 refills | Status: DC

## 2018-10-14 MED ORDER — ARIPIPRAZOLE 10 MG PO TABS: 10.0000 mg | ORAL_TABLET | Freq: Every day | ORAL | 2 refills | Status: DC

## 2018-10-14 MED ORDER — VILAZODONE HCL 40 MG PO TABS: 40.0000 mg | ORAL_TABLET | Freq: Every day | ORAL | 2 refills | Status: DC
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
## 2018-10-23 ENCOUNTER — Ambulatory Visit (HOSPITAL_COMMUNITY): Admission: RE | Admit: 2018-10-23 | Payer: Self-pay | Source: Ambulatory Visit

## 2018-11-03 ENCOUNTER — Other Ambulatory Visit (HOSPITAL_COMMUNITY): Payer: Self-pay | Admitting: Psychiatry

## 2018-11-03 ENCOUNTER — Telehealth (HOSPITAL_COMMUNITY): Payer: Self-pay | Admitting: *Deleted

## 2018-11-03 MED ORDER — ARIPIPRAZOLE 10 MG PO TABS: 10.0000 mg | ORAL_TABLET | Freq: Every day | ORAL | 2 refills | Status: DC

## 2018-11-03 MED ORDER — VILAZODONE HCL 40 MG PO TABS: 40.0000 mg | ORAL_TABLET | Freq: Every day | ORAL | 2 refills | Status: DC

## 2018-11-03 NOTE — Telephone Encounter (Signed)
Dr Tenny Crawoss  Patient called @ the suggestion of the RX requesting 90 day supply on her Abilify, Vyvanse & Viibyrd

## 2018-11-03 NOTE — Telephone Encounter (Signed)
viibryd and abilify sent. Cannot do 90 days on vyvanse--controlled med

## 2018-11-16 ENCOUNTER — Encounter (HOSPITAL_COMMUNITY): Payer: Self-pay | Admitting: Psychiatry

## 2018-11-16 ENCOUNTER — Ambulatory Visit (INDEPENDENT_AMBULATORY_CARE_PROVIDER_SITE_OTHER): Payer: Medicaid Other | Admitting: Psychiatry

## 2018-11-16 VITALS — BP 129/84 | HR 102 | Ht 62.0 in | Wt 197.0 lb

## 2018-11-16 DIAGNOSIS — F3162 Bipolar disorder, current episode mixed, moderate: Secondary | ICD-10-CM

## 2018-11-16 MED ORDER — ALPRAZOLAM 1 MG PO TABS: 1.0000 mg | ORAL_TABLET | Freq: Three times a day (TID) | ORAL | 2 refills | Status: DC | PRN

## 2018-11-16 MED ORDER — VILAZODONE HCL 40 MG PO TABS: 40.0000 mg | ORAL_TABLET | Freq: Every day | ORAL | 2 refills | Status: DC

## 2018-11-16 MED ORDER — DULOXETINE HCL 60 MG PO CPEP: 60.0000 mg | ORAL_CAPSULE | Freq: Two times a day (BID) | ORAL | 2 refills | Status: DC

## 2018-11-16 MED ORDER — LISDEXAMFETAMINE DIMESYLATE 60 MG PO CAPS: 60.0000 mg | ORAL_CAPSULE | ORAL | 0 refills | Status: DC

## 2018-11-16 MED ORDER — ARIPIPRAZOLE 10 MG PO TABS: 10.0000 mg | ORAL_TABLET | Freq: Every day | ORAL | 2 refills | Status: DC
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
## 2018-12-30 ENCOUNTER — Telehealth (HOSPITAL_COMMUNITY): Payer: Self-pay | Admitting: *Deleted

## 2018-12-30 NOTE — Telephone Encounter (Signed)
[place] TRACKS APPROVED VIIBRYD 40 MG # 30  EFFECTIVE 12/30/2018 ---- 12/25/2019    PA #   [id number]

## 2019-01-01 ENCOUNTER — Telehealth (HOSPITAL_COMMUNITY): Payer: Self-pay

## 2019-01-01 NOTE — Telephone Encounter (Signed)
Prior Authorization completed and approved for Viibryd 40 mg.  PA number is [id number]. Call interaction number is [id number] approved from 12/29/18 throught 12/23/2118

## 2019-01-18 ENCOUNTER — Ambulatory Visit (INDEPENDENT_AMBULATORY_CARE_PROVIDER_SITE_OTHER): Payer: Medicaid Other | Admitting: Psychiatry

## 2019-01-18 ENCOUNTER — Encounter (HOSPITAL_COMMUNITY): Payer: Self-pay | Admitting: Psychiatry

## 2019-01-18 VITALS — BP 127/83 | HR 102 | Ht 62.0 in | Wt 202.0 lb

## 2019-01-18 DIAGNOSIS — F3162 Bipolar disorder, current episode mixed, moderate: Secondary | ICD-10-CM | POA: Diagnosis not present

## 2019-01-18 MED ORDER — LISDEXAMFETAMINE DIMESYLATE 60 MG PO CAPS: 60.0000 mg | ORAL_CAPSULE | ORAL | 0 refills | Status: DC

## 2019-01-18 MED ORDER — VILAZODONE HCL 40 MG PO TABS: 40.0000 mg | ORAL_TABLET | Freq: Every day | ORAL | 2 refills | Status: DC

## 2019-01-18 MED ORDER — ALPRAZOLAM 1 MG PO TABS: 1.0000 mg | ORAL_TABLET | Freq: Three times a day (TID) | ORAL | 2 refills | Status: DC | PRN

## 2019-01-18 MED ORDER — ARIPIPRAZOLE 10 MG PO TABS: 10.0000 mg | ORAL_TABLET | Freq: Every day | ORAL | 2 refills | Status: DC

## 2019-01-18 MED ORDER — DULOXETINE HCL 60 MG PO CPEP: 60.0000 mg | ORAL_CAPSULE | Freq: Two times a day (BID) | ORAL | 2 refills | Status: DC
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
## 2019-01-21 ENCOUNTER — Telehealth (HOSPITAL_COMMUNITY): Payer: Self-pay | Admitting: *Deleted

## 2019-01-21 NOTE — Telephone Encounter (Signed)
Dr Tenny Craw Patient called stating that she has been on the Abilify for awhile now. And that she is a  pre-diabetic & that lately her BS(blood sugar) has been elevated. She asked if there is something she can take that wouldn't affect her BS?

## 2019-01-21 NOTE — Telephone Encounter (Signed)
All the meds in that class can elevate BG. Please have her send recent labs for me to review

## 2019-01-27 ENCOUNTER — Telehealth (HOSPITAL_COMMUNITY): Payer: Self-pay | Admitting: *Deleted

## 2019-01-27 ENCOUNTER — Other Ambulatory Visit (HOSPITAL_COMMUNITY): Payer: Self-pay | Admitting: Psychiatry

## 2019-01-27 MED ORDER — CARIPRAZINE HCL 1.5 MG PO CAPS: 1.5000 mg | ORAL_CAPSULE | Freq: Every day | ORAL | 2 refills | Status: DC

## 2019-01-27 NOTE — Telephone Encounter (Signed)
vraylar 1.5 mg sent

## 2019-01-27 NOTE — Telephone Encounter (Signed)
[place] TRACKS APPROVED: VRAYLAR 1.5 MG Capsules  P. A. # [id number]           EFFECTIVE: 01/27/2019       01/22/2020

## 2019-01-27 NOTE — Telephone Encounter (Signed)
Dr Tenny Craw Reviewed Labs sent from [facility]. And noticed Blood Sugar is elevated as patient was concerned. That her BS(blood sugar) has been elevated.  And asked if she stop the Abilify. Dr Tenny Craw stated she she should probably come off Abilify. Cut down to  5 mg for a week then discontinue. Patient was suggested Vraylar & is willing to try. Requested RX be sent to her pharmacy

## 2019-02-12 ENCOUNTER — Other Ambulatory Visit: Payer: Self-pay | Admitting: Neurology

## 2019-02-12 ENCOUNTER — Other Ambulatory Visit (HOSPITAL_COMMUNITY): Payer: Self-pay | Admitting: Neurology

## 2019-03-03 ENCOUNTER — Other Ambulatory Visit (HOSPITAL_COMMUNITY): Payer: Self-pay | Admitting: Neurology

## 2019-03-03 ENCOUNTER — Telehealth (HOSPITAL_COMMUNITY): Payer: Self-pay

## 2019-03-03 DIAGNOSIS — M7989 Other specified soft tissue disorders: Secondary | ICD-10-CM

## 2019-03-03 DIAGNOSIS — L0291 Cutaneous abscess, unspecified: Secondary | ICD-10-CM

## 2019-03-12 ENCOUNTER — Ambulatory Visit (HOSPITAL_COMMUNITY): Payer: Medicaid Other | Admitting: Psychiatry

## 2019-03-12 ENCOUNTER — Other Ambulatory Visit (HOSPITAL_COMMUNITY): Payer: Self-pay | Admitting: Psychiatry

## 2019-03-12 ENCOUNTER — Ambulatory Visit (HOSPITAL_COMMUNITY): Admission: RE | Admit: 2019-03-12 | Payer: Medicaid Other | Source: Ambulatory Visit

## 2019-03-15 ENCOUNTER — Other Ambulatory Visit (HOSPITAL_COMMUNITY): Payer: Self-pay | Admitting: Psychiatry

## 2019-03-15 ENCOUNTER — Telehealth (HOSPITAL_COMMUNITY): Payer: Self-pay | Admitting: *Deleted

## 2019-03-15 MED ORDER — LISDEXAMFETAMINE DIMESYLATE 60 MG PO CAPS: 60.0000 mg | ORAL_CAPSULE | ORAL | 0 refills | Status: DC

## 2019-03-15 NOTE — Telephone Encounter (Signed)
sent 

## 2019-03-15 NOTE — Telephone Encounter (Signed)
Dr Tenny Craw Patient LVM requesting refill for Vyvanse. Next appointment 04-12-2019

## 2019-03-19 ENCOUNTER — Ambulatory Visit (HOSPITAL_COMMUNITY): Payer: Medicaid Other | Admitting: Psychiatry

## 2019-03-26 ENCOUNTER — Other Ambulatory Visit (HOSPITAL_COMMUNITY): Payer: Self-pay | Admitting: Psychiatry

## 2019-04-13 ENCOUNTER — Ambulatory Visit (HOSPITAL_COMMUNITY): Payer: Medicaid Other | Admitting: Psychiatry

## 2019-04-13 ENCOUNTER — Encounter (HOSPITAL_COMMUNITY): Payer: Self-pay

## 2019-04-13 ENCOUNTER — Ambulatory Visit (HOSPITAL_COMMUNITY): Payer: Medicaid Other

## 2019-04-14 ENCOUNTER — Other Ambulatory Visit (HOSPITAL_COMMUNITY): Payer: Self-pay | Admitting: Psychiatry

## 2019-04-14 ENCOUNTER — Ambulatory Visit (INDEPENDENT_AMBULATORY_CARE_PROVIDER_SITE_OTHER): Payer: Medicaid Other | Admitting: Psychiatry

## 2019-04-14 ENCOUNTER — Other Ambulatory Visit: Payer: Self-pay

## 2019-04-14 ENCOUNTER — Encounter (HOSPITAL_COMMUNITY): Payer: Self-pay | Admitting: Psychiatry

## 2019-04-14 DIAGNOSIS — F902 Attention-deficit hyperactivity disorder, combined type: Secondary | ICD-10-CM | POA: Diagnosis not present

## 2019-04-14 DIAGNOSIS — F3162 Bipolar disorder, current episode mixed, moderate: Secondary | ICD-10-CM

## 2019-04-14 MED ORDER — CARIPRAZINE HCL 3 MG PO CAPS: 3.0000 mg | ORAL_CAPSULE | Freq: Every day | ORAL | 2 refills | Status: DC

## 2019-04-14 MED ORDER — ALPRAZOLAM 1 MG PO TABS: ORAL_TABLET | ORAL | 0 refills | Status: DC

## 2019-04-14 MED ORDER — VILAZODONE HCL 40 MG PO TABS: 40.0000 mg | ORAL_TABLET | Freq: Every day | ORAL | 2 refills | Status: DC

## 2019-04-14 MED ORDER — LISDEXAMFETAMINE DIMESYLATE 60 MG PO CAPS: 60.0000 mg | ORAL_CAPSULE | ORAL | 0 refills | Status: DC

## 2019-04-14 MED ORDER — DULOXETINE HCL 60 MG PO CPEP: 60.0000 mg | ORAL_CAPSULE | Freq: Two times a day (BID) | ORAL | 2 refills | Status: DC
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
## 2019-04-28 ENCOUNTER — Ambulatory Visit (HOSPITAL_COMMUNITY)
Admission: RE | Admit: 2019-04-28 | Discharge: 2019-04-28 | Disposition: A | Discharge status: Home / Self Care | Payer: Medicaid Other | Source: Ambulatory Visit | Attending: Neurology | Admitting: Neurology

## 2019-04-28 ENCOUNTER — Other Ambulatory Visit: Payer: Self-pay

## 2019-04-28 ENCOUNTER — Other Ambulatory Visit (HOSPITAL_COMMUNITY): Payer: Self-pay | Admitting: Neurology

## 2019-04-28 DIAGNOSIS — L0291 Cutaneous abscess, unspecified: Secondary | ICD-10-CM | POA: Diagnosis not present

## 2019-04-28 DIAGNOSIS — M7989 Other specified soft tissue disorders: Secondary | ICD-10-CM | POA: Insufficient documentation

## 2019-05-26 ENCOUNTER — Other Ambulatory Visit (HOSPITAL_COMMUNITY): Payer: Self-pay | Admitting: Psychiatry

## 2019-06-11 ENCOUNTER — Ambulatory Visit (INDEPENDENT_AMBULATORY_CARE_PROVIDER_SITE_OTHER): Payer: Medicaid Other | Admitting: Psychiatry

## 2019-06-11 ENCOUNTER — Encounter (HOSPITAL_COMMUNITY): Payer: Self-pay | Admitting: Psychiatry

## 2019-06-11 ENCOUNTER — Other Ambulatory Visit: Payer: Self-pay

## 2019-06-11 DIAGNOSIS — F3162 Bipolar disorder, current episode mixed, moderate: Secondary | ICD-10-CM

## 2019-06-11 DIAGNOSIS — F902 Attention-deficit hyperactivity disorder, combined type: Secondary | ICD-10-CM | POA: Diagnosis not present

## 2019-06-11 MED ORDER — ALPRAZOLAM 1 MG PO TABS: 1.0000 mg | ORAL_TABLET | Freq: Three times a day (TID) | ORAL | 2 refills | Status: DC

## 2019-06-11 MED ORDER — CARIPRAZINE HCL 3 MG PO CAPS: 3.0000 mg | ORAL_CAPSULE | Freq: Every day | ORAL | 2 refills | Status: DC

## 2019-06-11 MED ORDER — VIIBRYD 40 MG PO TABS: 40.0000 mg | ORAL_TABLET | Freq: Every day | ORAL | 2 refills | Status: DC

## 2019-06-11 MED ORDER — DULOXETINE HCL 60 MG PO CPEP: 60.0000 mg | ORAL_CAPSULE | Freq: Two times a day (BID) | ORAL | 2 refills | Status: DC

## 2019-06-11 MED ORDER — LISDEXAMFETAMINE DIMESYLATE 60 MG PO CAPS: 60.0000 mg | ORAL_CAPSULE | ORAL | 0 refills | Status: DC
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
## 2019-06-30 ENCOUNTER — Telehealth (HOSPITAL_COMMUNITY): Payer: Self-pay

## 2019-06-30 NOTE — Telephone Encounter (Signed)
We cannot increase it any further. We can try to get her in for sooner appointment if possible

## 2019-06-30 NOTE — Telephone Encounter (Signed)
Patient called and stated that she is bipolar manic and has a lot going on. She stated that she wants most of her family to leave her alone and she had also stated that yesterday she was wishing that she had never been born. I  asked patient if she felt like she wanted to harm herself or anyone else and she stated that she does not want to do that. Patient would like to know if increasing her Vraylar or doing something else would help. Please review and advise. Thank you.

## 2019-06-30 NOTE — Telephone Encounter (Signed)
Patient rescheduled for 7/9 appointment

## 2019-07-01 ENCOUNTER — Ambulatory Visit (INDEPENDENT_AMBULATORY_CARE_PROVIDER_SITE_OTHER): Payer: Medicaid Other | Admitting: Psychiatry

## 2019-07-01 ENCOUNTER — Other Ambulatory Visit: Payer: Self-pay

## 2019-07-01 ENCOUNTER — Encounter (HOSPITAL_COMMUNITY): Payer: Self-pay | Admitting: Psychiatry

## 2019-07-01 DIAGNOSIS — F3162 Bipolar disorder, current episode mixed, moderate: Secondary | ICD-10-CM

## 2019-07-01 DIAGNOSIS — F902 Attention-deficit hyperactivity disorder, combined type: Secondary | ICD-10-CM | POA: Diagnosis not present

## 2019-07-01 DIAGNOSIS — Z638 Other specified problems related to primary support group: Secondary | ICD-10-CM | POA: Diagnosis not present

## 2019-07-01 MED ORDER — LISDEXAMFETAMINE DIMESYLATE 60 MG PO CAPS: 60.0000 mg | ORAL_CAPSULE | ORAL | 0 refills | Status: DC

## 2019-07-01 MED ORDER — CARIPRAZINE HCL 3 MG PO CAPS: 3.0000 mg | ORAL_CAPSULE | Freq: Every day | ORAL | 2 refills | Status: DC

## 2019-07-01 MED ORDER — ALPRAZOLAM 1 MG PO TABS: 1.0000 mg | ORAL_TABLET | Freq: Three times a day (TID) | ORAL | 2 refills | Status: DC

## 2019-07-01 MED ORDER — VIIBRYD 40 MG PO TABS: 40.0000 mg | ORAL_TABLET | Freq: Every day | ORAL | 2 refills | Status: DC

## 2019-07-01 MED ORDER — DULOXETINE HCL 60 MG PO CPEP: 60.0000 mg | ORAL_CAPSULE | Freq: Two times a day (BID) | ORAL | 2 refills | Status: DC
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
## 2019-08-05 DIAGNOSIS — M47812 Spondylosis without myelopathy or radiculopathy, cervical region: Secondary | ICD-10-CM | POA: Insufficient documentation

## 2019-08-05 DIAGNOSIS — M5412 Radiculopathy, cervical region: Secondary | ICD-10-CM | POA: Insufficient documentation

## 2019-08-05 DIAGNOSIS — N882 Stricture and stenosis of cervix uteri: Secondary | ICD-10-CM | POA: Insufficient documentation

## 2019-08-05 DIAGNOSIS — M4800 Spinal stenosis, site unspecified: Secondary | ICD-10-CM | POA: Insufficient documentation

## 2019-08-11 ENCOUNTER — Other Ambulatory Visit: Payer: Self-pay

## 2019-08-11 ENCOUNTER — Encounter (HOSPITAL_COMMUNITY): Payer: Self-pay | Admitting: Psychiatry

## 2019-08-11 ENCOUNTER — Ambulatory Visit (INDEPENDENT_AMBULATORY_CARE_PROVIDER_SITE_OTHER): Payer: Medicaid Other | Admitting: Psychiatry

## 2019-08-11 DIAGNOSIS — M542 Cervicalgia: Secondary | ICD-10-CM | POA: Diagnosis not present

## 2019-08-11 DIAGNOSIS — F3162 Bipolar disorder, current episode mixed, moderate: Secondary | ICD-10-CM | POA: Diagnosis not present

## 2019-08-11 DIAGNOSIS — F902 Attention-deficit hyperactivity disorder, combined type: Secondary | ICD-10-CM | POA: Diagnosis not present

## 2019-08-11 DIAGNOSIS — F419 Anxiety disorder, unspecified: Secondary | ICD-10-CM | POA: Diagnosis not present

## 2019-08-11 MED ORDER — VIIBRYD 40 MG PO TABS: 40.0000 mg | ORAL_TABLET | Freq: Every day | ORAL | 2 refills | Status: DC

## 2019-08-11 MED ORDER — CARIPRAZINE HCL 3 MG PO CAPS: 3.0000 mg | ORAL_CAPSULE | Freq: Every day | ORAL | 2 refills | Status: DC

## 2019-08-11 MED ORDER — DULOXETINE HCL 60 MG PO CPEP: 60.0000 mg | ORAL_CAPSULE | Freq: Two times a day (BID) | ORAL | 2 refills | Status: DC

## 2019-08-11 MED ORDER — LISDEXAMFETAMINE DIMESYLATE 60 MG PO CAPS: 60.0000 mg | ORAL_CAPSULE | ORAL | 0 refills | Status: DC

## 2019-08-11 MED ORDER — ALPRAZOLAM 1 MG PO TABS: 1.0000 mg | ORAL_TABLET | Freq: Three times a day (TID) | ORAL | 2 refills | Status: DC
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
## 2019-10-11 ENCOUNTER — Encounter (HOSPITAL_COMMUNITY): Payer: Self-pay | Admitting: Psychiatry

## 2019-10-11 ENCOUNTER — Other Ambulatory Visit: Payer: Self-pay

## 2019-10-11 ENCOUNTER — Ambulatory Visit (INDEPENDENT_AMBULATORY_CARE_PROVIDER_SITE_OTHER): Payer: Medicaid Other | Admitting: Psychiatry

## 2019-10-11 DIAGNOSIS — F902 Attention-deficit hyperactivity disorder, combined type: Secondary | ICD-10-CM | POA: Diagnosis not present

## 2019-10-11 DIAGNOSIS — F3162 Bipolar disorder, current episode mixed, moderate: Secondary | ICD-10-CM

## 2019-10-11 MED ORDER — LISDEXAMFETAMINE DIMESYLATE 60 MG PO CAPS: 60.0000 mg | ORAL_CAPSULE | ORAL | 0 refills | Status: DC

## 2019-10-11 MED ORDER — DULOXETINE HCL 60 MG PO CPEP: 60.0000 mg | ORAL_CAPSULE | Freq: Two times a day (BID) | ORAL | 2 refills | Status: DC

## 2019-10-11 MED ORDER — ALPRAZOLAM 1 MG PO TABS: 1.0000 mg | ORAL_TABLET | Freq: Three times a day (TID) | ORAL | 2 refills | Status: DC

## 2019-10-11 MED ORDER — CARIPRAZINE HCL 3 MG PO CAPS: 3.0000 mg | ORAL_CAPSULE | Freq: Every day | ORAL | 2 refills | Status: DC

## 2019-10-11 MED ORDER — VIIBRYD 40 MG PO TABS: 40.0000 mg | ORAL_TABLET | Freq: Every day | ORAL | 2 refills | Status: DC
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
## 2019-12-07 ENCOUNTER — Other Ambulatory Visit: Payer: Self-pay

## 2019-12-07 ENCOUNTER — Ambulatory Visit (INDEPENDENT_AMBULATORY_CARE_PROVIDER_SITE_OTHER): Payer: Medicaid Other | Admitting: Psychiatry

## 2019-12-07 ENCOUNTER — Encounter (HOSPITAL_COMMUNITY): Payer: Self-pay | Admitting: Psychiatry

## 2019-12-07 DIAGNOSIS — F3162 Bipolar disorder, current episode mixed, moderate: Secondary | ICD-10-CM | POA: Diagnosis not present

## 2019-12-07 DIAGNOSIS — F902 Attention-deficit hyperactivity disorder, combined type: Secondary | ICD-10-CM

## 2019-12-07 MED ORDER — VIIBRYD 40 MG PO TABS: 40.0000 mg | ORAL_TABLET | Freq: Every day | ORAL | 2 refills | Status: DC

## 2019-12-07 MED ORDER — CARIPRAZINE HCL 3 MG PO CAPS: 3.0000 mg | ORAL_CAPSULE | Freq: Every day | ORAL | 2 refills | Status: DC

## 2019-12-07 MED ORDER — LISDEXAMFETAMINE DIMESYLATE 60 MG PO CAPS: 60.0000 mg | ORAL_CAPSULE | ORAL | 0 refills | Status: DC

## 2019-12-07 MED ORDER — CLONAZEPAM 1 MG PO TABS: 1.0000 mg | ORAL_TABLET | Freq: Three times a day (TID) | ORAL | 0 refills | Status: DC

## 2019-12-07 MED ORDER — DULOXETINE HCL 60 MG PO CPEP: 60.0000 mg | ORAL_CAPSULE | Freq: Two times a day (BID) | ORAL | 2 refills | Status: DC
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
## 2019-12-08 ENCOUNTER — Ambulatory Visit (HOSPITAL_COMMUNITY): Payer: Medicaid Other | Admitting: Psychiatry

## 2019-12-13 ENCOUNTER — Ambulatory Visit (HOSPITAL_COMMUNITY): Payer: Medicaid Other | Admitting: Psychiatry

## 2019-12-14 ENCOUNTER — Telehealth (HOSPITAL_COMMUNITY): Payer: Self-pay | Admitting: *Deleted

## 2019-12-14 ENCOUNTER — Other Ambulatory Visit (HOSPITAL_COMMUNITY): Payer: Self-pay | Admitting: Psychiatry

## 2019-12-14 MED ORDER — ALPRAZOLAM 1 MG PO TABS: 1.0000 mg | ORAL_TABLET | Freq: Three times a day (TID) | ORAL | 2 refills | Status: DC | PRN

## 2019-12-14 NOTE — Telephone Encounter (Signed)
sent 

## 2019-12-14 NOTE — Telephone Encounter (Signed)
Cannot increase it due to addiction potential, she can go back to xanax if it worked better

## 2019-12-14 NOTE — Telephone Encounter (Signed)
SPOKE WITH PATIENT & INFORMED PER PROVIDER: Cannot increase it due to addiction potential, she can go back to xanax if it worked better.  PATIENT STATED OKAY THEN SHE RATHER GO BACK ON THE XANAX AS PREVIOUS & ASKED FOR 90 DAY SUPPLY

## 2019-12-14 NOTE — Telephone Encounter (Signed)
PATIENT LVM  & CALLED IN STATING THAT YOU TOLD HER TO CALL & LET YOU KNOW HOW THE CLONAZEPAM IS WORKING . PATIENT STATES 'NOT DOING ANYTING' AND SHE DIDIN'T KNOW IF MAYBE YOU WANTED TO INCREASE IT?

## 2020-01-28 ENCOUNTER — Telehealth (HOSPITAL_COMMUNITY): Payer: Self-pay | Admitting: *Deleted

## 2020-01-28 NOTE — Telephone Encounter (Signed)
 TRACKS PRIOR AUTHORIZATION APPROVAL  Vilazodone HCl (VIIBRYD) 40 MG TABS # 30/30 DAYS  PA #:  [id number]            EFFECTIVE: 01/27/2020  THRU  01/21/2021

## 2020-02-03 LAB — HEMOGLOBIN A1C
Hemoglobin A1C: 7.4
Hemoglobin A1C: 7.4

## 2020-02-07 ENCOUNTER — Telehealth (HOSPITAL_COMMUNITY): Payer: Self-pay | Admitting: *Deleted

## 2020-02-07 ENCOUNTER — Encounter (HOSPITAL_COMMUNITY): Payer: Self-pay | Admitting: Psychiatry

## 2020-02-07 ENCOUNTER — Ambulatory Visit (INDEPENDENT_AMBULATORY_CARE_PROVIDER_SITE_OTHER): Payer: Medicaid Other | Admitting: Psychiatry

## 2020-02-07 ENCOUNTER — Other Ambulatory Visit: Payer: Self-pay

## 2020-02-07 DIAGNOSIS — F902 Attention-deficit hyperactivity disorder, combined type: Secondary | ICD-10-CM

## 2020-02-07 DIAGNOSIS — F3162 Bipolar disorder, current episode mixed, moderate: Secondary | ICD-10-CM

## 2020-02-07 MED ORDER — DULOXETINE HCL 60 MG PO CPEP: 60.0000 mg | ORAL_CAPSULE | Freq: Two times a day (BID) | ORAL | 2 refills | Status: DC

## 2020-02-07 MED ORDER — CARIPRAZINE HCL 3 MG PO CAPS: 3.0000 mg | ORAL_CAPSULE | Freq: Every day | ORAL | 2 refills | Status: DC

## 2020-02-07 MED ORDER — LISDEXAMFETAMINE DIMESYLATE 70 MG PO CAPS: 70.0000 mg | ORAL_CAPSULE | Freq: Every day | ORAL | 0 refills | Status: DC

## 2020-02-07 MED ORDER — ALPRAZOLAM 1 MG PO TABS: 1.0000 mg | ORAL_TABLET | Freq: Three times a day (TID) | ORAL | 2 refills | Status: DC | PRN

## 2020-02-07 MED ORDER — LISDEXAMFETAMINE DIMESYLATE 60 MG PO CAPS: 60.0000 mg | ORAL_CAPSULE | ORAL | 0 refills | Status: DC

## 2020-02-07 MED ORDER — VIIBRYD 40 MG PO TABS: 40.0000 mg | ORAL_TABLET | Freq: Every day | ORAL | 2 refills | Status: DC
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
## 2020-02-07 NOTE — Telephone Encounter (Signed)
[place] TRACKS PRIOR AUTHORIZATION APPROVAL cariprazine (VRAYLAR) capsule  : Take 1 capsule (3 mg total) by mouth daily.  P.A. # [id number]            EFFECTIVE: 02/07/2020    THRU    02/01/2021

## 2020-02-08 ENCOUNTER — Telehealth (HOSPITAL_COMMUNITY): Payer: Self-pay | Admitting: *Deleted

## 2020-02-08 NOTE — Telephone Encounter (Signed)
PATIENT CALLED [employer] TO SOON FOR INSURANCE TO PAY FOR NEW SCRIPT: lisdexamfetamine (VYVANSE) 70 MG capsule. UNTIL MARCH  [phone], PATIENT ASKED SHOULD SHE CONTINUE THE VYVANSE 60 MG DOSE UNTIL THEN?

## 2020-02-08 NOTE — Telephone Encounter (Signed)
yes

## 2020-02-21 ENCOUNTER — Other Ambulatory Visit (HOSPITAL_COMMUNITY): Payer: Self-pay | Admitting: Neurology

## 2020-02-21 DIAGNOSIS — M545 Low back pain, unspecified: Secondary | ICD-10-CM

## 2020-02-25 ENCOUNTER — Ambulatory Visit (INDEPENDENT_AMBULATORY_CARE_PROVIDER_SITE_OTHER): Payer: Medicaid Other | Admitting: Psychiatry

## 2020-02-25 ENCOUNTER — Other Ambulatory Visit: Payer: Self-pay

## 2020-02-25 ENCOUNTER — Encounter (HOSPITAL_COMMUNITY): Payer: Self-pay | Admitting: Psychiatry

## 2020-02-25 DIAGNOSIS — F902 Attention-deficit hyperactivity disorder, combined type: Secondary | ICD-10-CM

## 2020-02-25 DIAGNOSIS — F3162 Bipolar disorder, current episode mixed, moderate: Secondary | ICD-10-CM

## 2020-02-25 MED ORDER — TEMAZEPAM 15 MG PO CAPS: 15.0000 mg | ORAL_CAPSULE | Freq: Every evening | ORAL | 2 refills | Status: DC | PRN

## 2020-02-25 MED ORDER — DULOXETINE HCL 60 MG PO CPEP: 60.0000 mg | ORAL_CAPSULE | Freq: Two times a day (BID) | ORAL | 2 refills | Status: DC

## 2020-02-25 MED ORDER — ALPRAZOLAM 1 MG PO TABS: 1.0000 mg | ORAL_TABLET | Freq: Three times a day (TID) | ORAL | 2 refills | Status: DC | PRN

## 2020-02-25 MED ORDER — VIIBRYD 40 MG PO TABS: 40.0000 mg | ORAL_TABLET | Freq: Every day | ORAL | 2 refills | Status: DC

## 2020-02-25 MED ORDER — LISDEXAMFETAMINE DIMESYLATE 70 MG PO CAPS: 70.0000 mg | ORAL_CAPSULE | Freq: Every day | ORAL | 0 refills | Status: DC

## 2020-02-25 MED ORDER — CARIPRAZINE HCL 3 MG PO CAPS: 3.0000 mg | ORAL_CAPSULE | Freq: Every day | ORAL | 2 refills | Status: DC
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
## 2020-03-03 ENCOUNTER — Other Ambulatory Visit: Payer: Self-pay

## 2020-03-03 ENCOUNTER — Ambulatory Visit (HOSPITAL_COMMUNITY)
Admission: RE | Admit: 2020-03-03 | Discharge: 2020-03-03 | Disposition: A | Discharge status: Home / Self Care | Payer: Medicaid Other | Source: Ambulatory Visit | Attending: Neurology | Admitting: Neurology

## 2020-03-03 DIAGNOSIS — M545 Low back pain, unspecified: Secondary | ICD-10-CM

## 2020-03-06 IMAGING — MR MR CERVICAL SPINE W/O CM
4 of 5 series · 13 of 48 positions shown · non-contrast
Comparison: None.

CLINICAL DATA: Neck pain radiating into the right shoulder for 1
year.

EXAM:
MRI CERVICAL SPINE WITHOUT CONTRAST
TECHNIQUE: Multiplanar, multisequence MR imaging of the cervical spine was
performed. No intravenous contrast was administered.

[Series 4: T2 · sagittal · 3.0mm · 0.29mm/px · 4 of 13 slices shown (1 of 2)]
[im 1/13]
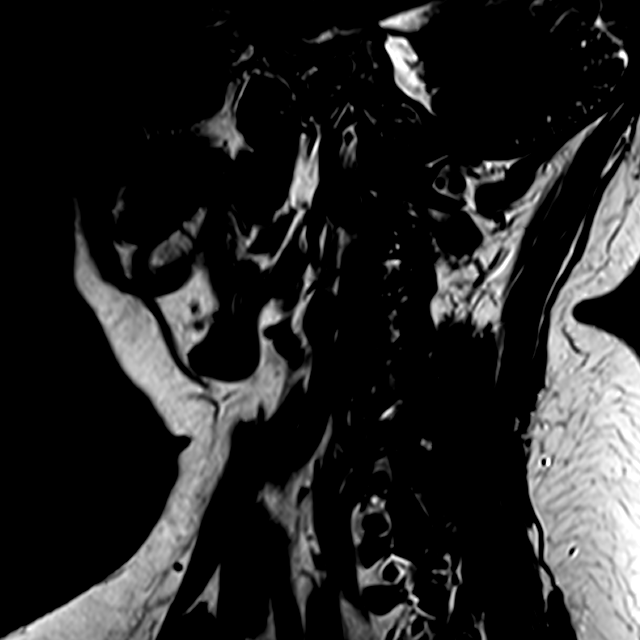
[im 3/13]
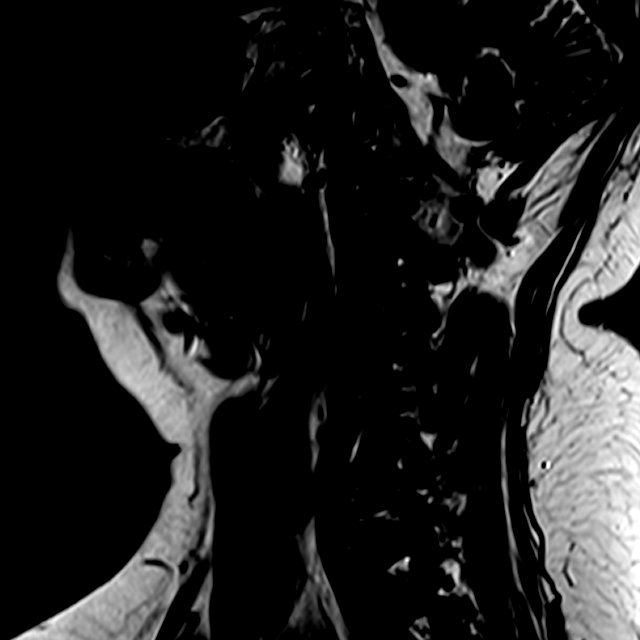
[im 8/13]
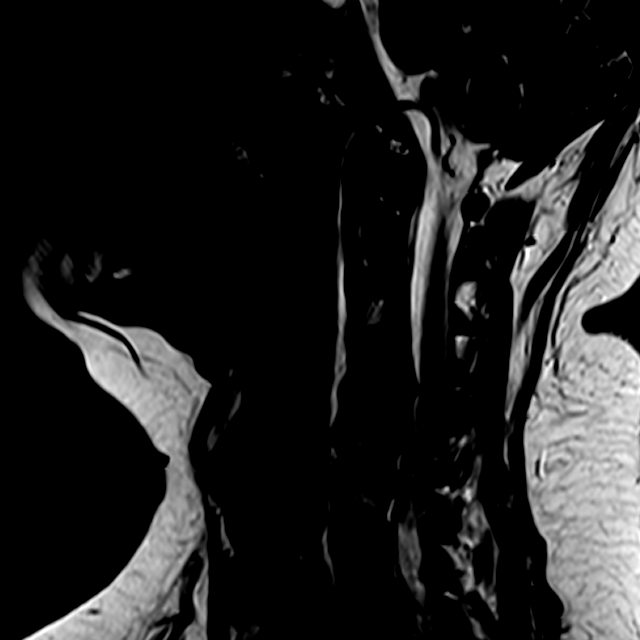
[im 13/13]
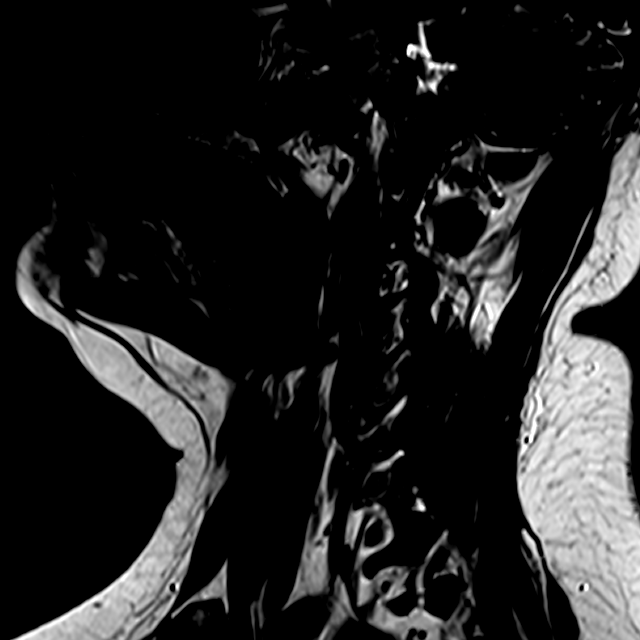

[Series 5: FLAIR · sagittal · 3.0mm · 0.41mm/px · 3 of 13 slices shown]
[im 3/13]
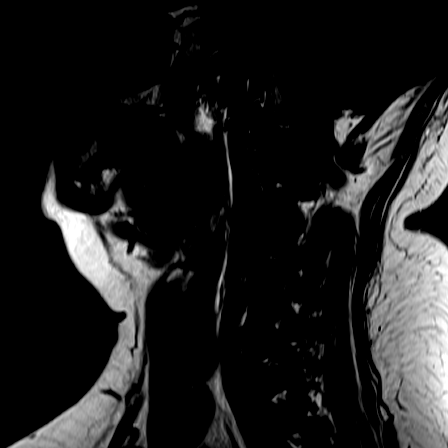
[im 7/13]
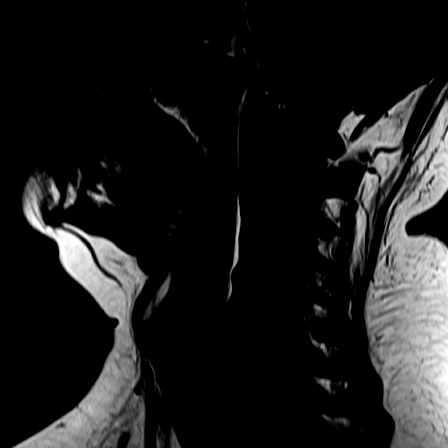
[im 11/13]
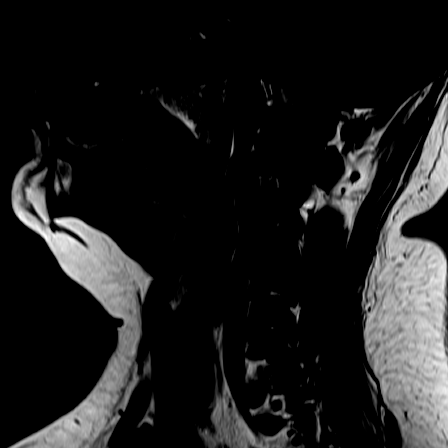

[Series 6: ir sagital · sagittal · 3.0mm · 0.20mm/px · 3 of 13 slices shown]
[im 3/13]
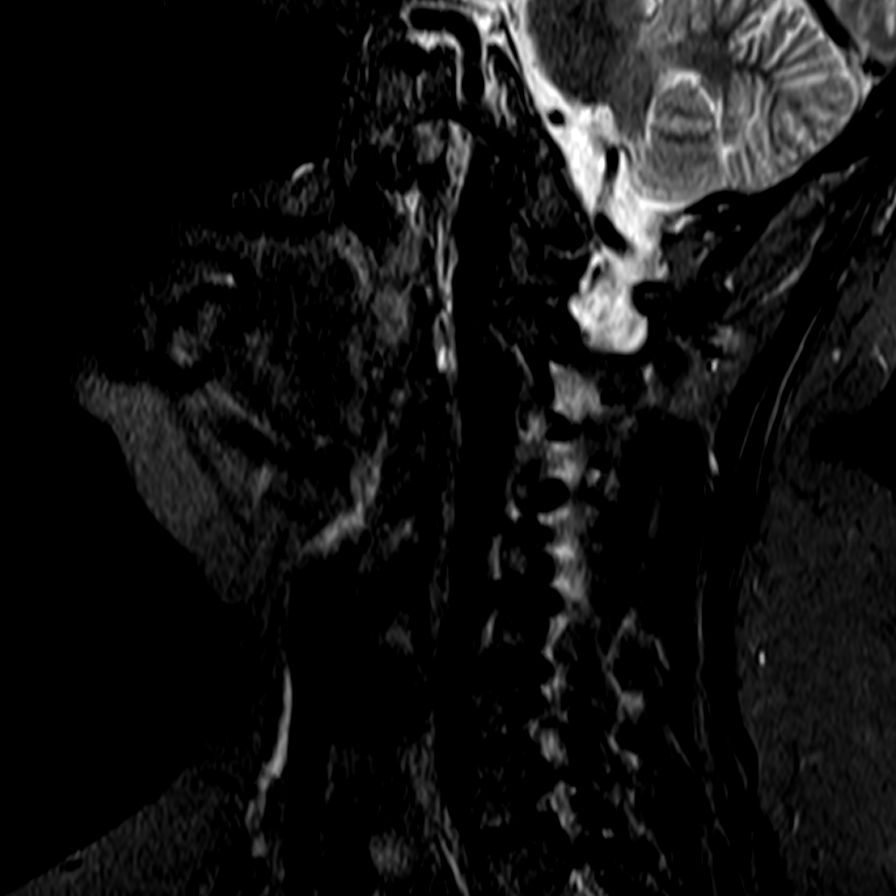
[im 7/13]
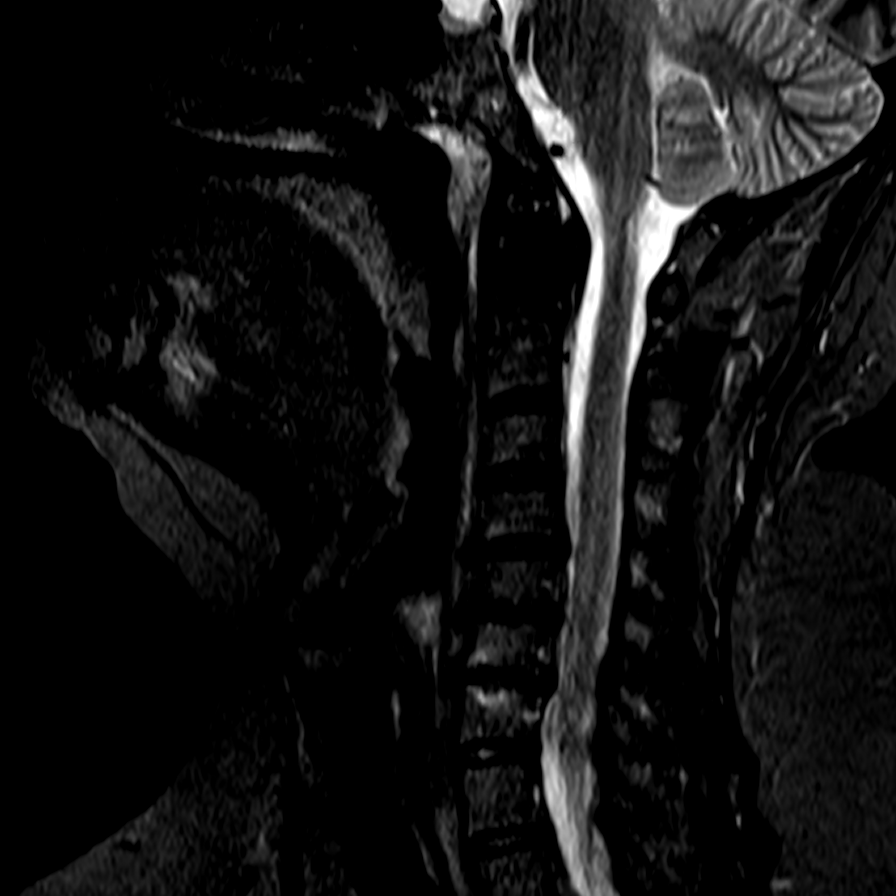
[im 11/13]
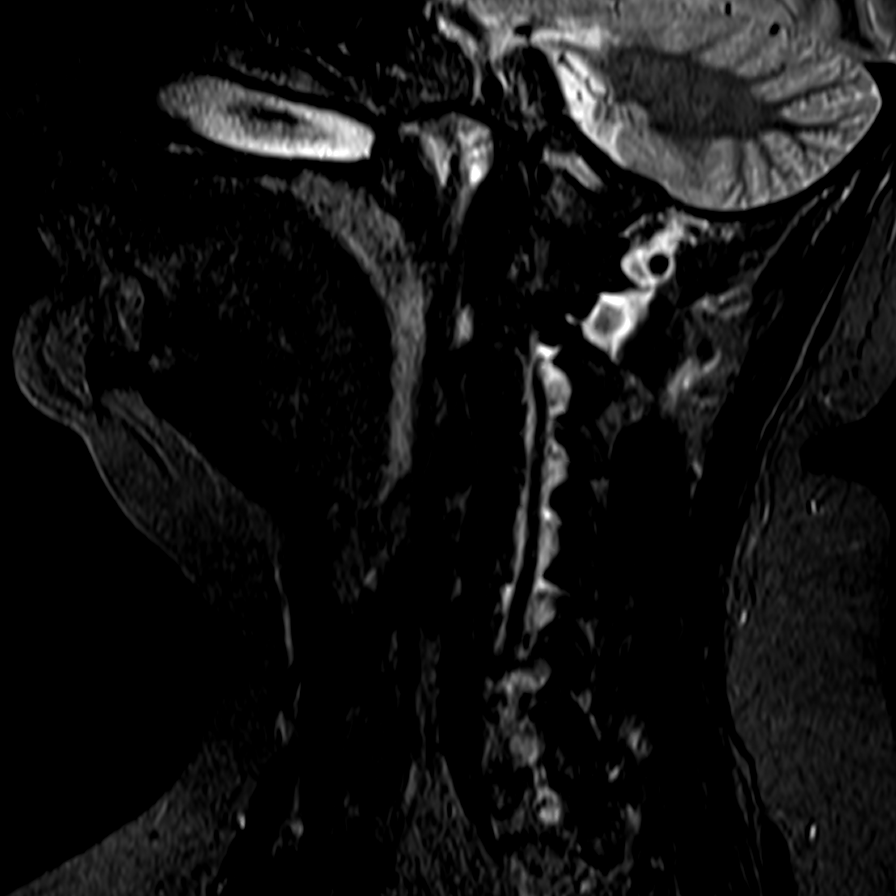

[Series 8: T2 · axial · 3.0mm · 0.21mm/px · z∈[-5,+54]mm · 3 of 26 slices shown (2 of 2)]
[im 4/26]
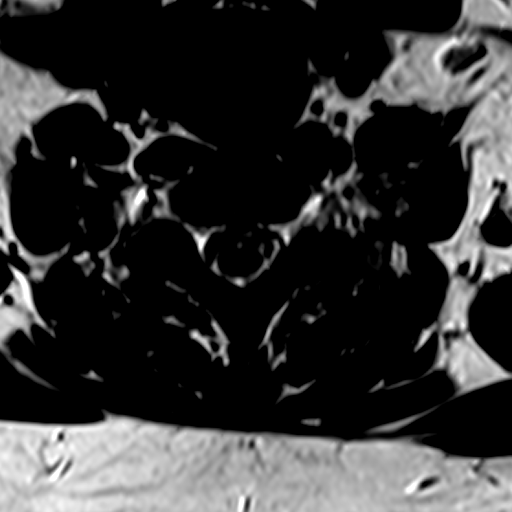
[im 14/26]
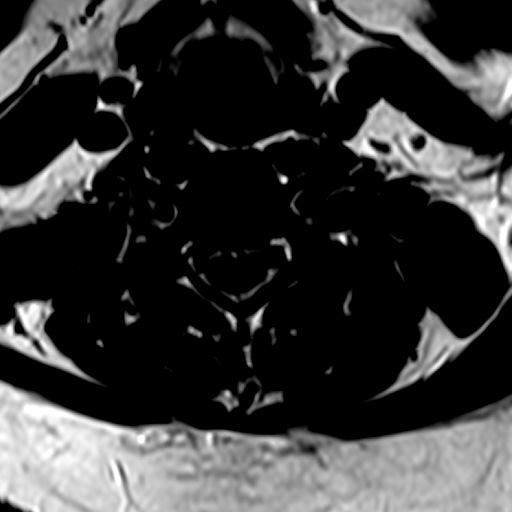
[im 22/26]
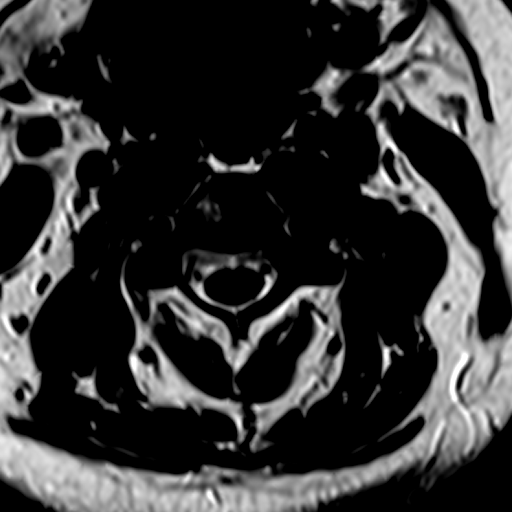

[13 of 48 positions shown; findings below may reference images not displayed]

FINDINGS: Alignment: Physiologic.

Vertebrae: No fracture, evidence of discitis, or bone lesion.

Cord: Normal signal and morphology.

Posterior Fossa, vertebral arteries, paraspinal tissues: No acute
paraspinal abnormality.

Disc levels:

Discs: Degenerative disease with disc height loss at C4-5, C5-6 and
C6-7 with reactive endplate changes at C5-6 and C6-7.

C2-3: No significant disc bulge. No neural foraminal stenosis. No
central canal stenosis.

C3-4: No significant disc bulge. No neural foraminal stenosis. No
central canal stenosis.

C4-5: Right foraminal disc osteophyte complex severely narrowing the
right neural foramen. No left foraminal stenosis. No central canal
stenosis.

C5-6: Broad-based disc bulge. Bilateral uncovertebral degenerative
changes. Mild bilateral foraminal stenosis. No central canal
stenosis.

C6-7: Broad-based disc bulge. Bilateral uncovertebral degenerative
changes. Mild right and moderate left foraminal stenosis. No central
canal stenosis.

C7-T1: No significant disc bulge. No neural foraminal stenosis. No
central canal stenosis.
IMPRESSION: 1. At C4-5 there is a right foraminal disc osteophyte complex
severely narrowing the right neural foramen.
2. Cervical spine spondylosis otherwise as described above.
3.  No acute osseous injury of the cervical spine.

## 2020-03-22 ENCOUNTER — Ambulatory Visit (HOSPITAL_COMMUNITY): Payer: Medicaid Other | Admitting: Psychiatry

## 2020-03-27 ENCOUNTER — Encounter (HOSPITAL_COMMUNITY): Payer: Self-pay | Admitting: Psychiatry

## 2020-03-27 ENCOUNTER — Ambulatory Visit (INDEPENDENT_AMBULATORY_CARE_PROVIDER_SITE_OTHER): Payer: Medicaid Other | Admitting: Psychiatry

## 2020-03-27 ENCOUNTER — Other Ambulatory Visit: Payer: Self-pay

## 2020-03-27 DIAGNOSIS — F902 Attention-deficit hyperactivity disorder, combined type: Secondary | ICD-10-CM | POA: Diagnosis not present

## 2020-03-27 DIAGNOSIS — F3162 Bipolar disorder, current episode mixed, moderate: Secondary | ICD-10-CM | POA: Diagnosis not present

## 2020-03-27 MED ORDER — ALPRAZOLAM 1 MG PO TABS: 1.0000 mg | ORAL_TABLET | Freq: Three times a day (TID) | ORAL | 2 refills | Status: DC | PRN

## 2020-03-27 MED ORDER — LISDEXAMFETAMINE DIMESYLATE 70 MG PO CAPS: 70.0000 mg | ORAL_CAPSULE | Freq: Every day | ORAL | 0 refills | Status: DC

## 2020-03-27 MED ORDER — DULOXETINE HCL 60 MG PO CPEP: 60.0000 mg | ORAL_CAPSULE | Freq: Two times a day (BID) | ORAL | 2 refills | Status: DC

## 2020-03-27 MED ORDER — TEMAZEPAM 30 MG PO CAPS: 30.0000 mg | ORAL_CAPSULE | Freq: Every evening | ORAL | 2 refills | Status: DC | PRN

## 2020-03-27 MED ORDER — CARIPRAZINE HCL 3 MG PO CAPS: 3.0000 mg | ORAL_CAPSULE | Freq: Every day | ORAL | 2 refills | Status: DC
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
## 2020-03-31 ENCOUNTER — Ambulatory Visit (HOSPITAL_COMMUNITY): Payer: Medicaid Other | Admitting: Psychiatry

## 2020-04-03 LAB — LIPID PANEL
Cholesterol: 194 (ref 0–200)
HDL: 50 (ref 35–70)
LDL Cholesterol: 105
Triglycerides: 276 — AB (ref 40–160)

## 2020-04-03 LAB — BASIC METABOLIC PANEL
BUN: 7 (ref 4–21)
Creatinine: 0.8 (ref 0.5–1.1)

## 2020-04-12 ENCOUNTER — Encounter: Payer: Self-pay | Admitting: Gastroenterology

## 2020-04-13 ENCOUNTER — Ambulatory Visit: Payer: Medicaid Other | Admitting: Orthopaedic Surgery

## 2020-04-18 ENCOUNTER — Encounter: Payer: Self-pay | Admitting: Orthopaedic Surgery

## 2020-04-18 ENCOUNTER — Ambulatory Visit: Payer: Medicaid Other | Admitting: Orthopaedic Surgery

## 2020-04-24 ENCOUNTER — Other Ambulatory Visit (HOSPITAL_COMMUNITY): Payer: Self-pay | Admitting: Emergency Medicine

## 2020-04-24 DIAGNOSIS — Z1231 Encounter for screening mammogram for malignant neoplasm of breast: Secondary | ICD-10-CM

## 2020-04-25 ENCOUNTER — Ambulatory Visit: Payer: Medicaid Other | Admitting: Orthopaedic Surgery

## 2020-05-01 ENCOUNTER — Ambulatory Visit: Payer: Medicaid Other | Admitting: Gastroenterology

## 2020-05-08 ENCOUNTER — Other Ambulatory Visit: Payer: Self-pay

## 2020-05-08 ENCOUNTER — Ambulatory Visit (INDEPENDENT_AMBULATORY_CARE_PROVIDER_SITE_OTHER): Payer: Medicaid Other | Admitting: "Endocrinology

## 2020-05-08 ENCOUNTER — Encounter: Payer: Self-pay | Admitting: "Endocrinology

## 2020-05-08 VITALS — BP 122/86 | HR 109 | Ht 63.5 in | Wt 207.0 lb

## 2020-05-08 DIAGNOSIS — F17200 Nicotine dependence, unspecified, uncomplicated: Secondary | ICD-10-CM

## 2020-05-08 DIAGNOSIS — E782 Mixed hyperlipidemia: Secondary | ICD-10-CM | POA: Diagnosis not present

## 2020-05-08 DIAGNOSIS — F17210 Nicotine dependence, cigarettes, uncomplicated: Secondary | ICD-10-CM | POA: Insufficient documentation

## 2020-05-08 DIAGNOSIS — Z6836 Body mass index (BMI) 36.0-36.9, adult: Secondary | ICD-10-CM

## 2020-05-08 DIAGNOSIS — E119 Type 2 diabetes mellitus without complications: Secondary | ICD-10-CM

## 2020-05-08 LAB — POCT GLYCOSYLATED HEMOGLOBIN (HGB A1C): Hemoglobin A1C: 7.2 % — AB (ref 4.0–5.6)

## 2020-05-08 MED ORDER — GLIPIZIDE 5 MG PO TABS: 5.0000 mg | ORAL_TABLET | Freq: Every day | ORAL | 1 refills | Status: DC

## 2020-05-08 NOTE — Progress Notes (Signed)
Endocrinology Consult Note       05/08/2020, 7:20 PM   Subjective:    Patient ID: Catherine Howard, female    DOB: Oct 08, 1976.  Catherine Howard is being seen in consultation for management of currently uncontrolled symptomatic diabetes requested by  Smith Robert, MD.   Past Medical History:  Diagnosis Date  . Elevated cholesterol   . GERD (gastroesophageal reflux disease)   . Thyroid disease   . Vitamin D deficiency     Past Surgical History:  Procedure Laterality Date  . CESAREAN SECTION    . DILATION AND CURETTAGE OF UTERUS      Social History   Socioeconomic History  . Marital status: Single    Spouse name: Not on file  . Number of children: Not on file  . Years of education: Not on file  . Highest education level: Not on file  Occupational History  . Not on file  Tobacco Use  . Smoking status: Current Every Day Smoker    Packs/day: 1.00    Years: 27.00    Pack years: 27.00    Types: Cigarettes  . Smokeless tobacco: Never Used  . Tobacco comment: Soon will be starting nicotine gum and patches by her PCP  Substance and Sexual Activity  . Alcohol use: No    Alcohol/week: 0.0 standard drinks  . Drug use: No  . Sexual activity: Yes    Partners: Male    Birth control/protection: None  Other Topics Concern  . Not on file  Social History Narrative  . Not on file   Social Determinants of Health   Financial Resource Strain:   . Difficulty of Paying Living Expenses:   Food Insecurity:   . Worried About Programme researcher, broadcasting/film/video in the Last Year:   . Barista in the Last Year:   Transportation Needs:   . Freight forwarder (Medical):   Marland Kitchen Lack of Transportation (Non-Medical):   Physical Activity:   . Days of Exercise per Week:   . Minutes of Exercise per Session:   Stress:   . Feeling of Stress :   Social Connections:   . Frequency of Communication with Friends and  Family:   . Frequency of Social Gatherings with Friends and Family:   . Attends Religious Services:   . Active Member of Clubs or Organizations:   . Attends Banker Meetings:   Marland Kitchen Marital Status:     Family History  Problem Relation Age of Onset  . Schizophrenia Father   . Alcohol abuse Father   . Diabetes Father   . Hyperlipidemia Father   . Alcohol abuse Paternal Uncle     Outpatient Encounter Medications as of 05/08/2020  Medication Sig  . Azelastine-Fluticasone (DYMISTA) 137-50 MCG/ACT SUSP Place 1 spray into the nose in the morning and at bedtime. One spray each nostril two times daily  . esomeprazole (NEXIUM) 20 MG capsule Take 20 mg by mouth daily at 12 noon.  Marland Kitchen glipiZIDE (GLUCOTROL) 5 MG tablet Take 1 tablet (5 mg total) by mouth daily before breakfast.  . methocarbamol (ROBAXIN) 500 MG tablet Take 500  mg by mouth every 6 (six) hours as needed for muscle spasms.  . rosuvastatin (CRESTOR) 20 MG tablet Take 20 mg by mouth daily.  . [DISCONTINUED] glipiZIDE (GLUCOTROL) 5 MG tablet Take by mouth daily before breakfast.  . ALPRAZolam (XANAX) 1 MG tablet Take 1 tablet (1 mg total) by mouth 3 (three) times daily as needed for anxiety.  . cariprazine (VRAYLAR) capsule Take 1 capsule (3 mg total) by mouth daily.  . Cholecalciferol (VITAMIN D) 2000 UNITS tablet Take 2,000 Units by mouth daily.  . diclofenac (VOLTAREN) 75 MG EC tablet   . DULoxetine (CYMBALTA) 60 MG capsule Take 1 capsule (60 mg total) by mouth 2 (two) times daily.  Marland Kitchen lisdexamfetamine (VYVANSE) 70 MG capsule Take 1 capsule (70 mg total) by mouth daily.  Marland Kitchen oxyCODONE-acetaminophen (PERCOCET) 10-325 MG tablet Take 1 tablet by mouth 2 (two) times daily as needed.  . temazepam (RESTORIL) 30 MG capsule Take 1 capsule (30 mg total) by mouth at bedtime as needed for sleep.  . TRADJENTA 5 MG TABS tablet   . TRELEGY ELLIPTA 100-62.5-25 MCG/INH AEPB   . [DISCONTINUED] atorvastatin (LIPITOR) 20 MG tablet Take 20 mg by  mouth daily.  . [DISCONTINUED] Butalbital-APAP-Caffeine 50-325-40 MG capsule   . [DISCONTINUED] lisdexamfetamine (VYVANSE) 70 MG capsule Take 1 capsule (70 mg total) by mouth daily.  . [DISCONTINUED] metFORMIN (GLUCOPHAGE) 500 MG tablet Take 500 mg by mouth 2 (two) times daily with a meal.  . [DISCONTINUED] propranolol (INDERAL) 10 MG tablet Take 10 mg by mouth 2 (two) times daily.   No facility-administered encounter medications on file as of 05/08/2020.    ALLERGIES: No Known Allergies  VACCINATION STATUS:  There is no immunization history on file for this patient.  Diabetes She presents for her initial diabetic visit. She has type 2 diabetes mellitus. Onset time: She was diagnosed at approximate age of 44 years. Her disease course has been stable. There are no diabetic associated symptoms. There are no hypoglycemic complications. Symptoms are stable. There are no diabetic complications. Risk factors for coronary artery disease include diabetes mellitus, dyslipidemia, obesity, sedentary lifestyle and tobacco exposure. Current diabetic treatments: She is currently on Tradjenta 5 mg daily .  She does not tolerate Metformin. Her weight is fluctuating minimally. She is following a generally unhealthy diet. When asked about meal planning, she reported none. She has not had a previous visit with a dietitian (She declined referral to a dietitian.). (She did not bring any logs nor meter with her to review today.  Her point-of-care A1c is 7.2%.)  Hyperlipidemia This is a chronic problem. The current episode started more than 1 year ago.     Review of Systems  Objective:    Vitals with BMI 05/08/2020 09/23/2018  Height 5' 3.5" -  Weight 207 lbs -  BMI 82.99 -  Systolic 371 696  Diastolic 86 87  Pulse 789 85  Some encounter information is confidential and restricted. Go to Review Flowsheets activity to see all data.    BP 122/86   Pulse (!) 109   Ht 5' 3.5" (1.613 m)   Wt 207 lb (93.9 kg)    BMI 36.09 kg/m   Wt Readings from Last 3 Encounters:  05/08/20 207 lb (93.9 kg)     Physical Exam    CMP ( most recent) CMP     Component Value Date/Time   NA 139 07/14/2014 0958   K 4.5 07/14/2014 0958   CL 109 07/14/2014 0958   CO2  23 07/14/2014 0958   GLUCOSE 89 07/14/2014 0958   BUN 7 04/03/2020 0000   CREATININE 0.8 04/03/2020 0000   CREATININE 0.75 07/14/2014 0958   CALCIUM 9.5 07/14/2014 0958     Diabetic Labs (most recent): Lab Results  Component Value Date   HGBA1C 7.2 (A) 05/08/2020   HGBA1C 7.4 02/03/2020   HGBA1C 7.4 02/03/2020     Lipid Panel     Component Value Date/Time   CHOL 194 04/03/2020 0000   TRIG 276 (A) 04/03/2020 0000   HDL 50 04/03/2020 0000   LDLCALC 105 04/03/2020 0000      Assessment & Plan:   1. Type 2 diabetes mellitus without complication, without long-term current use of insulin (HCC)  - Saachi Zale has currently uncontrolled symptomatic type 2 DM since  44 years of age,  with most recent A1c of 7.2 %. Recent labs reviewed. - I had a long discussion with her about the progressive nature of diabetes and the pathology behind its complications. -her diabetes is complicated by obesity/sedentary life, chronic  Smoking, sleep apnea and she remains at a high risk for more acute and chronic complications which include CAD, CVA, CKD, retinopathy, and neuropathy. These are all discussed in detail with her.  - I have counseled her on diet  and weight management  by adopting a carbohydrate restricted/protein rich diet. Patient is encouraged to switch to  unprocessed or minimally processed     complex starch and increased protein intake (animal or plant source), fruits, and vegetables. -  she is advised to stick to a routine mealtimes to eat 3 meals  a day and avoid unnecessary snacks ( to snack only to correct hypoglycemia).   - she admits that there is a room for improvement in her food and drink choices. - Suggestion is made for  her to avoid simple carbohydrates  from her diet including Cakes, Sweet Desserts, Ice Cream, Soda (diet and regular), Sweet Tea, Candies, Chips, Cookies, Store Bought Juices, Alcohol in Excess of  1-2 drinks a day, Artificial Sweeteners,  Coffee Creamer, and "Sugar-free" Products. This will help patient to have more stable blood glucose profile and potentially avoid unintended weight gain.  - she declined referral to CDE.   - I have approached her with the following individualized plan to manage  her diabetes and patient agrees:  -Based on her controlled A1c of 7.2%, she will not need insulin treatment for now.   -She does not tolerate Metformin.  I discussed and added glipizide 5 mg XL p.o. daily at breakfast along with her Tradjenta 5 mg p.o. daily also at breakfast. -She will monitor blood glucose daily before breakfast. - she is encouraged to call clinic for blood glucose levels less than 70 or above 200 mg /dl.  - Specific targets for  A1c;  LDL, HDL,  and Triglycerides were discussed with the patient.  2) Blood Pressure /Hypertension:  her blood pressure is  controlled to target.   she is not on any blood pressure medications for now.  She will be considered for low-dose lisinopril for renal protection next visit.    3) Lipids/Hyperlipidemia:   Review of her recent lipid panel showed uncontrolled  LDL at 105 .  she  is advised to continue Crestor 20 mg daily at bedtime.  Side effects and precautions discussed with her.  4)  Weight/Diet:  Body mass index is 36.09 kg/m.  -   clearly complicating her diabetes care.   she is  a candidate for weight loss. I discussed with her the fact that loss of 5 - 10% of her  current body weight will have the most impact on her diabetes management.  Exercise, and detailed carbohydrates information provided  -  detailed on discharge instructions.  The patient was counseled on the dangers of tobacco use, and was advised to quit.  Reviewed strategies to maximize  success, including removing cigarettes and smoking materials from environment.   5) Chronic Care/Health Maintenance:  -she  is on Statin medications and  is encouraged to initiate and continue to follow up with Ophthalmology, Dentist,  Podiatrist at least yearly or according to recommendations, and advised to  Quit smoking. I have recommended yearly flu vaccine and pneumonia vaccine at least every 5 years; moderate intensity exercise for up to 150 minutes weekly; and  sleep for at least 7 hours a day.  - she is  advised to maintain close follow up with Smith Robert, MD for primary care needs, as well as her other providers for optimal and coordinated care.   - Time spent in this patient care: 60 min, of which > 50% was spent in  counseling  her about her currently uncontrolled type 2 diabetes, hyperlipidemia, smoking, obesity and the rest reviewing her blood glucose logs , discussing her hypoglycemia and hyperglycemia episodes, reviewing her current and  previous labs / studies  ( including abstraction from other facilities) and medications  doses and developing a  long term treatment plan based on the latest standards of care/ guidelines; and documenting her care.    Please refer to Patient Instructions for Blood Glucose Monitoring and Insulin/Medications Dosing Guide"  in media tab for additional information. Please  also refer to " Patient Self Inventory" in the Media  tab for reviewed elements of pertinent patient history.  [name] participated in the discussions, expressed understanding, and voiced agreement with the above plans.  All questions were answered to her satisfaction. she is encouraged to contact clinic should she have any questions or concerns prior to her return visit.   Follow up plan: - Return in about 4 months (around 09/08/2020) for Bring Meter and Logs- A1c in Office.  Marquis Lunch, MD [facility] [address] [place], [place] [address] Phone: [phone]  Fax: [phone]    05/08/2020, 7:20 PM  This note was partially dictated with voice recognition software. Similar sounding words can be transcribed inadequately or may not  be corrected upon review.

## 2020-05-08 NOTE — Patient Instructions (Signed)

## 2020-05-29 ENCOUNTER — Telehealth: Payer: Self-pay | Admitting: "Endocrinology

## 2020-05-29 ENCOUNTER — Ambulatory Visit (HOSPITAL_COMMUNITY): Payer: Medicaid Other | Admitting: Psychiatry

## 2020-05-29 NOTE — Telephone Encounter (Signed)
No change, advise her to call back if fasting glucose levels less than 70 or above 200 mg /dl x 3 days in a week.

## 2020-05-29 NOTE — Telephone Encounter (Signed)
Spoke with pt, checks blood sugar once daily, nothing out of ordinary, please advise

## 2020-05-29 NOTE — Telephone Encounter (Signed)
Patient is calling and states yesterday morning her sugar was 205 and this morning it was 93. Requesting call back from nurse.   [phone]

## 2020-05-30 ENCOUNTER — Encounter (HOSPITAL_COMMUNITY): Payer: Self-pay | Admitting: Psychiatry

## 2020-05-30 ENCOUNTER — Other Ambulatory Visit: Payer: Self-pay

## 2020-05-30 ENCOUNTER — Telehealth (INDEPENDENT_AMBULATORY_CARE_PROVIDER_SITE_OTHER): Payer: Medicaid Other | Admitting: Psychiatry

## 2020-05-30 DIAGNOSIS — F902 Attention-deficit hyperactivity disorder, combined type: Secondary | ICD-10-CM

## 2020-05-30 DIAGNOSIS — F3162 Bipolar disorder, current episode mixed, moderate: Secondary | ICD-10-CM | POA: Diagnosis not present

## 2020-05-30 MED ORDER — DULOXETINE HCL 60 MG PO CPEP: 60.0000 mg | ORAL_CAPSULE | Freq: Two times a day (BID) | ORAL | 2 refills | Status: DC

## 2020-05-30 MED ORDER — LISDEXAMFETAMINE DIMESYLATE 70 MG PO CAPS: 70.0000 mg | ORAL_CAPSULE | Freq: Every day | ORAL | 0 refills | Status: DC

## 2020-05-30 MED ORDER — ALPRAZOLAM 1 MG PO TABS: 1.0000 mg | ORAL_TABLET | Freq: Three times a day (TID) | ORAL | 2 refills | Status: DC | PRN

## 2020-05-30 MED ORDER — TEMAZEPAM 30 MG PO CAPS: 30.0000 mg | ORAL_CAPSULE | Freq: Every evening | ORAL | 2 refills | Status: DC | PRN

## 2020-05-30 MED ORDER — CARIPRAZINE HCL 3 MG PO CAPS: 3.0000 mg | ORAL_CAPSULE | Freq: Every day | ORAL | 2 refills | Status: DC

## 2020-05-30 NOTE — Telephone Encounter (Signed)
Message left for pt, advised of recommendations.
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
## 2020-06-08 ENCOUNTER — Telehealth: Payer: Self-pay | Admitting: "Endocrinology

## 2020-06-08 NOTE — Telephone Encounter (Signed)
Looks like she will need addition of insulin, can you move up her appt. in few days. Request her check BG at least 2 times a day and return with logs and meter. If she is doing evening snacks, she needs to stop.

## 2020-06-08 NOTE — Telephone Encounter (Signed)
Readings:  6/6 - 205 am, did not check at bedtime 6/7 93 am, 195 bedtime 6/8 - 215 am, 143 bedtime 6/9 - did not check 6/10 - 228 am, 177 bedtime 6/11 -232 am, 136 bedtime 6/12 - 239 am, 142 bedtime 6/13 - 220 am, did not check at bedtime 6/14 - 226 am, did not check at bedtime 6/15 - 212 am, did not check at bedtime 6/16 - did not check 6/17 - 222 am

## 2020-06-09 ENCOUNTER — Ambulatory Visit (INDEPENDENT_AMBULATORY_CARE_PROVIDER_SITE_OTHER): Payer: Medicaid Other | Admitting: "Endocrinology

## 2020-06-09 ENCOUNTER — Encounter: Payer: Self-pay | Admitting: "Endocrinology

## 2020-06-09 ENCOUNTER — Other Ambulatory Visit: Payer: Self-pay

## 2020-06-09 VITALS — BP 110/78 | HR 105 | Ht 63.5 in | Wt 207.0 lb

## 2020-06-09 DIAGNOSIS — E782 Mixed hyperlipidemia: Secondary | ICD-10-CM

## 2020-06-09 DIAGNOSIS — E119 Type 2 diabetes mellitus without complications: Secondary | ICD-10-CM | POA: Diagnosis not present

## 2020-06-09 MED ORDER — LANTUS SOLOSTAR 100 UNIT/ML ~~LOC~~ SOPN: 20.0000 [IU] | PEN_INJECTOR | Freq: Every day | SUBCUTANEOUS | 2 refills | Status: DC

## 2020-06-09 MED ORDER — BD PEN NEEDLE SHORT U/F 31G X 8 MM MISC: 1.0000 | 2 refills | Status: DC

## 2020-06-09 NOTE — Patient Instructions (Signed)

## 2020-06-09 NOTE — Progress Notes (Signed)
Endocrinology Consult Note       06/09/2020, 2:25 PM   Subjective:    Patient ID: Catherine Howard, female    DOB: Apr 08, 1976.  Catherine Howard is being seen in consultation for management of currently uncontrolled symptomatic diabetes requested by  Smith RobertKikel, Stephen, MD.   Past Medical History:  Diagnosis Date  . Elevated cholesterol   . GERD (gastroesophageal reflux disease)   . Thyroid disease   . Vitamin D deficiency     Past Surgical History:  Procedure Laterality Date  . CESAREAN SECTION    . DILATION AND CURETTAGE OF UTERUS      Social History   Socioeconomic History  . Marital status: Single    Spouse name: Not on file  . Number of children: Not on file  . Years of education: Not on file  . Highest education level: Not on file  Occupational History  . Not on file  Tobacco Use  . Smoking status: Current Every Day Smoker    Packs/day: 1.00    Years: 27.00    Pack years: 27.00    Types: Cigarettes  . Smokeless tobacco: Never Used  . Tobacco comment: Soon will be starting nicotine gum and patches by her PCP  Substance and Sexual Activity  . Alcohol use: No    Alcohol/week: 0.0 standard drinks  . Drug use: No  . Sexual activity: Yes    Partners: Male    Birth control/protection: None  Other Topics Concern  . Not on file  Social History Narrative  . Not on file   Social Determinants of Health   Financial Resource Strain:   . Difficulty of Paying Living Expenses:   Food Insecurity:   . Worried About Programme researcher, broadcasting/film/videounning Out of Food in the Last Year:   . Baristaan Out of Food in the Last Year:   Transportation Needs:   . Freight forwarderLack of Transportation (Medical):   Marland Kitchen. Lack of Transportation (Non-Medical):   Physical Activity:   . Days of Exercise per Week:   . Minutes of Exercise per Session:   Stress:   . Feeling of Stress :   Social Connections:   . Frequency of Communication with Friends and  Family:   . Frequency of Social Gatherings with Friends and Family:   . Attends Religious Services:   . Active Member of Clubs or Organizations:   . Attends BankerClub or Organization Meetings:   Marland Kitchen. Marital Status:     Family History  Problem Relation Age of Onset  . Schizophrenia Father   . Alcohol abuse Father   . Diabetes Father   . Hyperlipidemia Father   . Alcohol abuse Paternal Uncle     Outpatient Encounter Medications as of 06/09/2020  Medication Sig  . ALPRAZolam (XANAX) 1 MG tablet Take 1 tablet (1 mg total) by mouth 3 (three) times daily as needed for anxiety.  . Azelastine-Fluticasone (DYMISTA) 137-50 MCG/ACT SUSP Place 1 spray into the nose in the morning and at bedtime. One spray each nostril two times daily  . cariprazine (VRAYLAR) capsule Take 1 capsule (3 mg total) by mouth daily.  . Cholecalciferol (VITAMIN D) 2000  UNITS tablet Take 2,000 Units by mouth daily.  . diclofenac (VOLTAREN) 75 MG EC tablet   . DULoxetine (CYMBALTA) 60 MG capsule Take 1 capsule (60 mg total) by mouth 2 (two) times daily.  Marland Kitchen esomeprazole (NEXIUM) 20 MG capsule Take 20 mg by mouth daily at 12 noon.  Marland Kitchen glipiZIDE (GLUCOTROL) 5 MG tablet Take 1 tablet (5 mg total) by mouth daily before breakfast.  . insulin glargine (LANTUS SOLOSTAR) 100 UNIT/ML Solostar Pen Inject 20 Units into the skin daily.  . Insulin Pen Needle (B-D ULTRAFINE III SHORT PEN) 31G X 8 MM MISC 1 each by Does not apply route as directed.  . lisdexamfetamine (VYVANSE) 70 MG capsule Take 1 capsule (70 mg total) by mouth daily.  Marland Kitchen lisdexamfetamine (VYVANSE) 70 MG capsule Take 1 capsule (70 mg total) by mouth daily.  . methocarbamol (ROBAXIN) 500 MG tablet Take 500 mg by mouth every 6 (six) hours as needed for muscle spasms.  Marland Kitchen oxyCODONE-acetaminophen (PERCOCET) 10-325 MG tablet Take 1 tablet by mouth 2 (two) times daily as needed.  . rosuvastatin (CRESTOR) 20 MG tablet Take 20 mg by mouth daily.  . temazepam (RESTORIL) 30 MG capsule Take  1 capsule (30 mg total) by mouth at bedtime as needed for sleep.  . TRADJENTA 5 MG TABS tablet   . TRELEGY ELLIPTA 100-62.5-25 MCG/INH AEPB    No facility-administered encounter medications on file as of 06/09/2020.    ALLERGIES: No Known Allergies  VACCINATION STATUS:  There is no immunization history on file for this patient.  Diabetes She presents for her follow-up diabetic visit. She has type 2 diabetes mellitus. Onset time: She was diagnosed at approximate age of 44 years. Her disease course has been worsening. Pertinent negatives for hypoglycemia include no confusion, headaches, pallor or seizures. Associated symptoms include fatigue, polydipsia and polyuria. Pertinent negatives for diabetes include no chest pain and no polyphagia. There are no hypoglycemic complications. Symptoms are worsening. There are no diabetic complications. Risk factors for coronary artery disease include diabetes mellitus, dyslipidemia, obesity, sedentary lifestyle and tobacco exposure. Current diabetic treatments: She is currently on Tradjenta 5 mg daily .  She does not tolerate Metformin. Her weight is fluctuating minimally. She is following a generally unhealthy diet. When asked about meal planning, she reported none. She has not had a previous visit with a dietitian (She declined referral to a dietitian.). Her home blood glucose trend is increasing steadily. Her breakfast blood glucose range is generally 180-200 mg/dl. Her bedtime blood glucose range is generally 180-200 mg/dl. Her overall blood glucose range is 180-200 mg/dl. (She called prior to her scheduled appointment for hyperglycemia.  Review of her logs showing readings between 195-240 fasting blood glucose profile.  This is despite her recent point-of-care A1c of 7.2% )  Hyperlipidemia This is a chronic problem. The current episode started more than 1 year ago. Pertinent negatives include no chest pain, myalgias or shortness of breath.     Review of  Systems  Constitutional: Positive for fatigue. Negative for chills, fever and unexpected weight change.  HENT: Negative for trouble swallowing and voice change.   Eyes: Negative for visual disturbance.  Respiratory: Negative for cough, shortness of breath and wheezing.   Cardiovascular: Negative for chest pain, palpitations and leg swelling.  Gastrointestinal: Negative for diarrhea, nausea and vomiting.  Endocrine: Positive for polydipsia and polyuria. Negative for cold intolerance, heat intolerance and polyphagia.  Musculoskeletal: Negative for arthralgias and myalgias.  Skin: Negative for color change, pallor, rash and  wound.  Neurological: Negative for seizures and headaches.  Psychiatric/Behavioral: Negative for confusion and suicidal ideas.    Objective:    Vitals with BMI 06/09/2020 05/08/2020 09/23/2018  Height 5' 3.5" 5' 3.5" -  Weight 207 lbs 207 lbs -  BMI 36.09 36.09 -  Systolic 110 122 193  Diastolic 78 86 87  Pulse 105 109 85  Some encounter information is confidential and restricted. Go to Review Flowsheets activity to see all data.    BP 110/78   Pulse (!) 105   Ht 5' 3.5" (1.613 m)   Wt 207 lb (93.9 kg)   BMI 36.09 kg/m   Wt Readings from Last 3 Encounters:  06/09/20 207 lb (93.9 kg)  05/08/20 207 lb (93.9 kg)     Physical Exam Constitutional:      Appearance: She is well-developed.  HENT:     Head: Normocephalic and atraumatic.  Neck:     Thyroid: No thyromegaly.     Trachea: No tracheal deviation.  Cardiovascular:     Rate and Rhythm: Normal rate and regular rhythm.  Pulmonary:     Effort: Pulmonary effort is normal.  Abdominal:     Palpations: Abdomen is soft.     Tenderness: There is no abdominal tenderness. There is no guarding.  Musculoskeletal:        General: Normal range of motion.     Cervical back: Normal range of motion and neck supple.  Skin:    General: Skin is warm and dry.     Coloration: Skin is not pale.     Findings: No  erythema or rash.  Neurological:     Mental Status: She is alert and oriented to person, place, and time.     Cranial Nerves: No cranial nerve deficit.     Coordination: Coordination normal.     Deep Tendon Reflexes: Reflexes are normal and symmetric.  Psychiatric:        Judgment: Judgment normal.       CMP ( most recent) CMP     Component Value Date/Time   NA 139 07/14/2014 0958   K 4.5 07/14/2014 0958   CL 109 07/14/2014 0958   CO2 23 07/14/2014 0958   GLUCOSE 89 07/14/2014 0958   BUN 7 04/03/2020 0000   CREATININE 0.8 04/03/2020 0000   CREATININE 0.75 07/14/2014 0958   CALCIUM 9.5 07/14/2014 0958     Diabetic Labs (most recent): Lab Results  Component Value Date   HGBA1C 7.2 (A) 05/08/2020   HGBA1C 7.4 02/03/2020   HGBA1C 7.4 02/03/2020     Lipid Panel     Component Value Date/Time   CHOL 194 04/03/2020 0000   TRIG 276 (A) 04/03/2020 0000   HDL 50 04/03/2020 0000   LDLCALC 105 04/03/2020 0000      Assessment & Plan:   1. Type 2 diabetes mellitus without complication, without long-term current use of insulin (HCC)  - Alahni Varone has currently uncontrolled symptomatic type 2 DM since  44 years of age.  She called prior to her scheduled appointment for hyperglycemia.  Review of her logs showing readings between 195-240 fasting blood glucose profile.  This is despite her recent point-of-care A1c of 7.2%.  - I had a long discussion with her about the progressive nature of diabetes and the pathology behind its complications. -her diabetes is complicated by obesity/sedentary life, chronic  Smoking, sleep apnea and she remains at a high risk for more acute and chronic complications which include CAD,  CVA, CKD, retinopathy, and neuropathy. These are all discussed in detail with her.  - I have counseled her on diet  and weight management  by adopting a carbohydrate restricted/protein rich diet. Patient is encouraged to switch to  unprocessed or minimally  processed     complex starch and increased protein intake (animal or plant source), fruits, and vegetables. -  she is advised to stick to a routine mealtimes to eat 3 meals  a day and avoid unnecessary snacks ( to snack only to correct hypoglycemia).   - she  admits there is a room for improvement in her diet and drink choices. -  Suggestion is made for her to avoid simple carbohydrates  from her diet including Cakes, Sweet Desserts / Pastries, Ice Cream, Soda (diet and regular), Sweet Tea, Candies, Chips, Cookies, Sweet Pastries,  Store Bought Juices, Alcohol in Excess of  1-2 drinks a day, Artificial Sweeteners, Coffee Creamer, and "Sugar-free" Products. This will help patient to have stable blood glucose profile and potentially avoid unintended weight gain.   - she declined referral to CDE.   - I have approached her with the following individualized plan to manage  her diabetes and patient agrees:  -Based on her presentation with persistent hyperglycemia, she is approached for insulin treatment.  I discussed and initiated Lantus 20 units nightly associated with monitoring of blood glucose twice a day-daily before breakfast and at bedtime.    -Insulin use technique was demonstrated for her in the exam room.    - she is encouraged to call clinic for blood glucose levels less than 70 or above 200 mg /dl. -She is advised to continue Tradjenta 5 mg daily, and lower her glipizide to 5 mg p.o. daily at breakfast.  - Specific targets for  A1c;  LDL, HDL,  and Triglycerides were discussed with the patient.  2) Blood Pressure /Hypertension: Her blood pressure is controlled to target.  she is not on any blood pressure medications for now.  She will be considered for low-dose lisinopril for renal protection next visit.    3) Lipids/Hyperlipidemia:   Review of her recent lipid panel showed uncontrolled  LDL at 105 .  she  is advised to continue Crestor 20 mg p.o. daily at bedtime.   Side effects and  precautions discussed with her.  4)  Weight/Diet:  Body mass index is 36.09 kg/m.  -   clearly complicating her diabetes care.   she is  a candidate for weight loss. I discussed with her the fact that loss of 5 - 10% of her  current body weight will have the most impact on her diabetes management.  Exercise, and detailed carbohydrates information provided  -  detailed on discharge instructions.  The patient was counseled on the dangers of tobacco use, and was advised to quit.  Reviewed strategies to maximize success, including removing cigarettes and smoking materials from environment.   5) Chronic Care/Health Maintenance:  -she  is on Statin medications and  is encouraged to initiate and continue to follow up with Ophthalmology, Dentist,  Podiatrist at least yearly or according to recommendations, and advised to  Quit smoking. I have recommended yearly flu vaccine and pneumonia vaccine at least every 5 years; moderate intensity exercise for up to 150 minutes weekly; and  sleep for at least 7 hours a day.  - she is  advised to maintain close follow up with Vidal Schwalbe, MD for primary care needs, as well as her other providers for  optimal and coordinated care.   - Time spent on this patient care encounter:  35 min, of which > 50% was spent in  counseling and the rest reviewing her blood glucose logs , discussing her hypoglycemia and hyperglycemia episodes, reviewing her current and  previous labs / studies  ( including abstraction from other facilities) and medications  doses and developing a  long term treatment plan and documenting her care.   Please refer to Patient Instructions for Blood Glucose Monitoring and Insulin/Medications Dosing Guide"  in media tab for additional information. Please  also refer to " Patient Self Inventory" in the Media  tab for reviewed elements of pertinent patient history.  Catherine Ducking participated in the discussions, expressed understanding, and voiced  agreement with the above plans.  All questions were answered to her satisfaction. she is encouraged to contact clinic should she have any questions or concerns prior to her return visit.    Follow up plan: - Return in about 9 weeks (around 08/11/2020) for Bring Meter and Logs- A1c in Office.  Marquis Lunch, MD [facility] [address] [place], [place] [address] Phone: [phone]  Fax: [phone]    06/09/2020, 2:25 PM  This note was partially dictated with voice recognition software. Similar sounding words can be transcribed inadequately or may not  be corrected upon review.

## 2020-06-14 ENCOUNTER — Ambulatory Visit: Payer: Medicaid Other | Admitting: Gastroenterology

## 2020-06-15 ENCOUNTER — Telehealth (HOSPITAL_COMMUNITY): Payer: Self-pay | Admitting: *Deleted

## 2020-06-15 NOTE — Telephone Encounter (Signed)
Patient called stating that her temazepam is not working. Per pt she have been up all night long. Per pt she needs something that'll help her sleep.  [phone]

## 2020-06-15 NOTE — Telephone Encounter (Signed)
Ask her if she is interested in starting Trazodone. If not, will wait for Dr. Tenny Craw to return and address this issue.

## 2020-06-15 NOTE — Telephone Encounter (Signed)
Informed patient with what provider stated and she she tried Trazodone before and it did not work. Informed patient that message will be sent to Dr. Tenny Craw instead.

## 2020-06-16 ENCOUNTER — Telehealth: Payer: Self-pay | Admitting: Internal Medicine

## 2020-06-16 ENCOUNTER — Other Ambulatory Visit (HOSPITAL_COMMUNITY): Payer: Self-pay | Admitting: Psychiatry

## 2020-06-16 MED ORDER — ZOLPIDEM TARTRATE 10 MG PO TABS: 10.0000 mg | ORAL_TABLET | Freq: Every evening | ORAL | 2 refills | Status: DC | PRN

## 2020-06-16 NOTE — Telephone Encounter (Signed)
Informed patient with what Dr. Tenny Craw stated and she verbalized understanding.

## 2020-06-16 NOTE — Telephone Encounter (Signed)
Patient called the office stating she wanted to switch her DME company from [employer] to [employer] in [employer]. That they needed last OV note, sleep study and prescription for CPAP supplies. Informed patient that she has never been seen by our office and that at this time I could not do that for her. 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 Sherene Sires on 6/30 to establish care. Informed patient that she would have to wait until her appointment to get this done. Sleep study is in Epic but we will need OV note and order. Patient expressed understanding. Nothing further needed at this time.

## 2020-06-16 NOTE — Telephone Encounter (Signed)
Tell her ambien 10 mg sent in

## 2020-06-19 NOTE — Telephone Encounter (Signed)
6/22 239 morning 6/23 did not test 6/24 228 morning 6/25 272 morning 6/26 272 morning 6/27 248 morning 6/28 202

## 2020-06-19 NOTE — Telephone Encounter (Signed)
Discussed with pt, understanding voiced. 

## 2020-06-19 NOTE — Telephone Encounter (Signed)
Advise to increase Lantus to 40 units qhs.

## 2020-06-21 ENCOUNTER — Other Ambulatory Visit: Payer: Self-pay

## 2020-06-21 ENCOUNTER — Ambulatory Visit (INDEPENDENT_AMBULATORY_CARE_PROVIDER_SITE_OTHER): Payer: Medicaid Other | Admitting: Internal Medicine

## 2020-06-21 ENCOUNTER — Encounter: Payer: Self-pay | Admitting: Internal Medicine

## 2020-06-21 DIAGNOSIS — J449 Chronic obstructive pulmonary disease, unspecified: Secondary | ICD-10-CM | POA: Diagnosis not present

## 2020-06-21 DIAGNOSIS — R05 Cough: Secondary | ICD-10-CM | POA: Diagnosis not present

## 2020-06-21 DIAGNOSIS — F17210 Nicotine dependence, cigarettes, uncomplicated: Secondary | ICD-10-CM | POA: Diagnosis not present

## 2020-06-21 DIAGNOSIS — R058 Other specified cough: Secondary | ICD-10-CM | POA: Insufficient documentation

## 2020-06-21 MED ORDER — ATROVENT HFA 17 MCG/ACT IN AERS: 2.0000 | INHALATION_SPRAY | Freq: Four times a day (QID) | RESPIRATORY_TRACT | 11 refills | Status: DC | PRN

## 2020-06-21 NOTE — Assessment & Plan Note (Signed)
Try off dpi and on max gerd rx 06/21/2020 >>>   Upper airway cough syndrome (previously labeled PNDS),  is so named because it's frequently impossible to sort out how much is  CR/sinusitis with freq throat clearing (which can be related to primary GERD)   vs  causing  secondary (" extra esophageal")  GERD from wide swings in gastric pressure that occur with throat clearing, often  promoting self use of mint and menthol lozenges that reduce the lower esophageal sphincter tone and exacerbate the problem further in a cyclical fashion.   These are the same pts (now being labeled as having "irritable larynx syndrome" by some cough centers) who not infrequently have a history of having failed to tolerate ace inhibitors,  dry powder inhalers or biphosphonates or report having atypical/extraesophageal reflux symptoms that don't respond to standard doses of PPI  and are easily confused as having aecopd or asthma flares by even experienced allergists/ pulmonologists (myself included).   >>> f/u in 6 weeks with pfts          Each maintenance medication was reviewed in detail including emphasizing most importantly the difference between maintenance and prns and under what circumstances the prns are to be triggered using an action plan format where appropriate.  Total time for H and P, chart review, counseling, teaching device and generating customized AVS unique to this office visit / charting = 60 min

## 2020-06-21 NOTE — Assessment & Plan Note (Addendum)
Counseled re importance of smoking cessation but did not meet time criteria for separate billing      I reviewed the Fletcher curve with the patient that basically indicates  if you quit smoking when your best day FEV1 is still well preserved (as is clearly  the case here)  it is highly unlikely you will progress to severe disease and informed the patient there was  no medication on the market that has proven to alter the curve/ its downward trajectory  or the likelihood of progression of their disease(unlike other chronic medical conditions such as atheroclerosis where we do think we can change the natural hx with risk reducing meds)    Therefore stopping smoking and maintaining abstinence are  the most important aspects of care, not choice of inhalers or for that matter, doctors.   Treatment other than smoking cessation  is entirely directed by severity of symptoms and focused also on reducing exacerbations, not attempting to change the natural history of the disease.   

## 2020-06-21 NOTE — Progress Notes (Signed)
Jacqulyn DuckingMichelle Rosenwald, female    DOB: [birth year],    MRN: [id number]   Brief patient profile:  [age] yowf smoker with brother with Down Syndrome with last IUP > wt baseline then 196 in 2008 with onset around 2019 sob/cough with nl pfts 07/2018 x for low ERV rx chronically with Trelegy and prn atrovent and referred to pulmonary clinic in [place]  06/21/2020 by Dr  Bryna ColanderKikel     History of Present Illness  06/21/2020  Pulmonary/ 1st office eval/Chalee Hirota  No vacicination / no trelegy on day of ov Chief Complaint  Patient presents with  . Pulmonary Consult    Referred by Dr. Smith RobertStephen Kikel. Former Dr Juanetta GoslingHawkins pt. She states has hx of asthma and COPD. Breathing is "so so today". She c/o cough and feeling of mucus stuck in her throat. She is using atrovent schedued qid.    Dyspnea: does flat surface ok all days, it's the hills and steps = doe  then use atrovent, never prechallenges MMRC1 = can walk nl pace, flat grade, can't hurry or go uphills or steps s sob   Cough: sporadic but does not wake her up/ something stuck in throat daytime but no mucus Sleep: doesn't bother her sleep and doesn't wake her up x months SABA use: says atrovent helps   No obvious day to day or daytime variability or assoc excess/ purulent sputum or mucus plugs or hemoptysis or cp or chest tightness, subjective wheeze or overt sinus or hb symptoms.   Sleeping  without nocturnal  or early am exacerbation  of respiratory  c/o's or need for noct saba. Also denies any obvious fluctuation of symptoms with weather or environmental changes or other aggravating or alleviating factors except as outlined above   No unusual exposure hx or h/o childhood pna/ asthma or knowledge of premature birth.  Current Allergies, Complete Past Medical History, Past Surgical History, Family History, and Social History were reviewed in [facility] electronic medical record.  ROS  The following are not active complaints unless bolded Hoarseness, sore throat,  dysphagia, dental problems, itching, sneezing,  nasal congestion or discharge of excess mucus or purulent secretions, ear ache,   fever, chills, sweats, unintended wt loss or wt gain, classically pleuritic or exertional cp,  orthopnea pnd or arm/hand swelling  or leg swelling, presyncope, palpitations, abdominal pain, anorexia, nausea, vomiting, diarrhea  or change in bowel habits or change in bladder habits, change in stools or change in urine, dysuria, hematuria,  rash, arthralgias, visual complaints, headache, numbness, weakness or ataxia or problems with walking or coordination,  change in mood/ anxious or  memory.           Past Medical History:  Diagnosis Date  . Elevated cholesterol   . GERD (gastroesophageal reflux disease)   . Thyroid disease   . Vitamin D deficiency     Outpatient Medications Prior to Visit  Medication Sig Dispense Refill  . ALPRAZolam (XANAX) 1 MG tablet Take 1 tablet (1 mg total) by mouth 3 (three) times daily as needed for anxiety. 90 tablet 2  . Azelastine-Fluticasone (DYMISTA) 137-50 MCG/ACT SUSP Place 1 spray into the nose in the morning and at bedtime. One spray each nostril two times daily    . cariprazine (VRAYLAR) capsule Take 1 capsule (3 mg total) by mouth daily. 30 capsule 2  . Cholecalciferol (VITAMIN D) 2000 UNITS tablet Take 2,000 Units by mouth daily.    . DULoxetine (CYMBALTA) 60 MG capsule Take 1 capsule (60 mg  total) by mouth 2 (two) times daily. 180 capsule 2  . esomeprazole (NEXIUM) 20 MG capsule Take 20 mg by mouth daily at 12 noon.    Marland Kitchen glipiZIDE (GLUCOTROL) 5 MG tablet Take 1 tablet (5 mg total) by mouth daily before breakfast. 90 tablet 1  . insulin glargine (LANTUS SOLOSTAR) 100 UNIT/ML Solostar Pen Inject 20 Units into the skin daily. 5 pen 2  . Insulin Pen Needle (B-D ULTRAFINE III SHORT PEN) 31G X 8 MM MISC 1 each by Does not apply route as directed. 100 each 2  . lisdexamfetamine (VYVANSE) 70 MG capsule Take 1 capsule (70 mg total) by  mouth daily. 30 capsule 0  . methocarbamol (ROBAXIN) 500 MG tablet Take 500 mg by mouth every 6 (six) hours as needed for muscle spasms.    . nabumetone (RELAFEN) 500 MG tablet Take 500 mg by mouth 2 (two) times daily.    Marland Kitchen oxyCODONE-acetaminophen (PERCOCET) 10-325 MG tablet Take 1 tablet by mouth 2 (two) times daily as needed.    . rosuvastatin (CRESTOR) 20 MG tablet Take 20 mg by mouth daily.    . TRADJENTA 5 MG TABS tablet     . TRELEGY ELLIPTA 100-62.5-25 MCG/INH AEPB     . zolpidem (AMBIEN) 10 MG tablet Take 1 tablet (10 mg total) by mouth at bedtime as needed for sleep. 30 tablet 2  . diclofenac (VOLTAREN) 75 MG EC tablet   0  . lisdexamfetamine (VYVANSE) 70 MG capsule Take 1 capsule (70 mg total) by mouth daily. 30 capsule 0      Objective:     BP 126/78 (BP Location: Left Arm, Cuff Size: Normal)   Pulse (!) 111   Temp 97.9 F (36.6 C) (Oral)   Ht 5' 3.5" (1.613 m)   Wt 208 lb (94.3 kg)   SpO2 98% Comment: on RA  BMI 36.27 kg/m   SpO2: 98 % (on RA)  amb mod obese somber wf nad   HEENT : pt wearing mask not removed for exam due to covid -19 concerns.    NECK :  without JVD/Nodes/TM/ nl carotid upstrokes bilaterally   LUNGS: no acc muscle use,  Nl contour chest which is clear to A and P bilaterally without cough on insp or exp maneuvers   CV:  RRR  no s3 or murmur or increase in P2, and no edema   ABD:  Obese/ soft and nontender with nl inspiratory excursion in the supine position. No bruits or organomegaly appreciated, bowel sounds nl  MS:  Nl gait/ ext warm without deformities, calf tenderness, cyanosis or clubbing No obvious joint restrictions   SKIN: warm and dry without lesions    NEURO:  alert, approp, nl sensorium with  no motor or cerebellar deficits apparent.    CXR PA and Lateral:   06/21/2020 :    I personally reviewed images and agree with radiology impression as follows:     Did not go for cxr as requested      Assessment   COPD GOLD 0   Active smoker  - PFT's  08/18/18   FEV1 2.52 (88 % ) ratio 0.87  p no  % improvement from saba p ? prior to study with DLCO  15.25 (66%) corrects to 3.93 (84%)  for alv volume and FV curve nl with ERV 26%  - 06/21/2020  After extensive coaching inhaler device,  effectiveness =    25% at baseline (double triggers at end insp > d/c trelegy  She is gold 0 group A as fare as I can tell and prn saba or sama reasonable  I spent extra time with pt today reviewing appropriate use of albuterol for prn use on exertion with the following points: 1) sama  is for relief of sob that does not improve by walking a slower pace or resting but rather if the pt does not improve after trying this first. 2) If the pt is convinced, as many are, that sama helps recover from activity faster then it's easy to tell if this is the case by re-challenging : ie stop, take the inhaler, then p 10 minutes try the exact same activity (intensity of workload) that just caused the symptoms and see if they are substantially diminished or not after saba 3) if there is an activity that reproducibly causes the symptoms, try the sama 30  min before the activity on alternate days   If in fact the sama really does help, then fine to continue to use it prn but advised may need to look closer at the maintenance regimen being used to achieve better control of airways disease with exertion.   Pt informed of the seriousness of COVID 19 infection as a direct risk to lung health  and safey and to close contacts and should continue to wear a facemask in public and minimize exposure to public locations but especially avoid any area or activity where non-close contacts are not observing distancing or wearing an appropriate face mask.  I strongly recommended she take either of the vaccines available through local drugstores based on updated information on millions of Americans treated with the Moderna and [employer] products  which have proven both safe and   effective even against the new delta variant.      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   These are the same pts (now being labeled as having "irritable larynx syndrome" by some cough centers) who not infrequently have a history of having failed to tolerate ace inhibitors,  dry powder inhalers or biphosphonates or report having atypical/extraesophageal reflux symptoms that don't respond to standard doses of PPI  and are easily confused as having aecopd or asthma flares by even experienced allergists/ pulmonologists (myself included).      Cigarette smoker Counseled re importance of smoking cessation but did not meet time criteria for separate billing    I reviewed the Fletcher curve with the patient that basically indicates  if you quit smoking when your best day FEV1 is still well preserved (as is clearly  the case here)  it is highly unlikely you will progress to severe disease and informed the patient there was  no medication on the market that has proven to alter the curve/ its downward trajectory  or the likelihood of progression of their disease(unlike other chronic medical conditions such as atheroclerosis where we do think we can change the natural hx with risk reducing meds)  Therefore stopping smoking and maintaining abstinence are  the most important aspects of care, not choice of inhalers or for that matter, doctors.         >>> f/u in 6  weeks with pfts     Each maintenance medication was reviewed in detail including emphasizing most importantly the difference between maintenance and prns and under what circumstances the prns are to be triggered using an action plan format where appropriate.  Total time for H and P, chart review, counseling, teaching device and generating customized AVS unique to this office visit / charting = 60 min       Treatment other than smoking cessation  is entirely directed by severity of symptoms and focused also on reducing exacerbations, not attempting to change the natural history of the disease.       Sandrea Hughs, MD 06/21/2020

## 2020-06-21 NOTE — Patient Instructions (Addendum)
Stop the trelegy now   Try Atrovent 30  min before an activity that you know would make you short of breath and see if it makes any difference and if makes none then don't take it after activity unless you can't catch your breath.  Continue nexium Take 30- 60 min before your first and last meals of the day   GERD (REFLUX)  is an extremely common cause of respiratory symptoms just like yours , many times with no obvious heartburn at all.    It can be treated with medication, but also with lifestyle changes including elevation of the head of your bed (ideally with 6 -8inch blocks under the headboard of your bed),  Smoking cessation, avoidance of late meals, excessive alcohol, and avoid fatty foods, chocolate, peppermint, colas, red wine, and acidic juices such as orange juice.  NO MINT OR MENTHOL PRODUCTS SO NO COUGH DROPS  USE SUGARLESS CANDY INSTEAD (Jolley ranchers or Stover's or Life Savers) or even ice chips will also do - the key is to swallow to prevent all throat clearing. NO OIL BASED VITAMINS - use powdered substitutes.  Avoid fish oil when coughing.    I strongly recommend the moderna or pfizer as soon as you can based on proven safety and effectiveness in over 150 million Americans  The key is to stop smoking completely before smoking completely stops you!  Please remember to go to the  x-ray department  for your tests - we will call you with the results when they are available    Please schedule a follow up office visit in 6 weeks, call sooner if needed with pfts on return

## 2020-06-21 NOTE — Assessment & Plan Note (Addendum)
Active smoker  - PFT's  08/18/18   FEV1 2.52 (88 % ) ratio 0.87  p no  % improvement from saba p ? prior to study with DLCO  15.25 (66%) corrects to 3.93 (84%)  for alv volume and FV curve nl with ERV 26%  - 06/21/2020  After extensive coaching inhaler device,  effectiveness =    25% at baseline (double triggers at end insp > d/c trelegy (? Causing globus sensation, no change doe on vs off clinically)  She is gold 0 group A as fare as I can tell and prn saba or sama reasonable  I spent extra time with pt today reviewing appropriate use of albuterol for prn use on exertion with the following points: 1) sama  is for relief of sob that does not improve by walking a slower pace or resting but rather if the pt does not improve after trying this first. 2) If the pt is convinced, as many are, that sama helps recover from activity faster then it's easy to tell if this is the case by re-challenging : ie stop, take the inhaler, then p 10 minutes try the exact same activity (intensity of workload) that just caused the symptoms and see if they are substantially diminished or not after saba 3) if there is an activity that reproducibly causes the symptoms, try the sama 30  min before the activity on alternate days   If in fact the sama really does help, then fine to continue to use it prn but advised may need to look closer at the maintenance regimen being used to achieve better control of airways disease with exertion.   Pt informed of the seriousness of COVID 19 infection as a direct risk to lung health  and safey and to close contacts and should continue to wear a facemask in public and minimize exposure to public locations but especially avoid any area or activity where non-close contacts are not observing distancing or wearing an appropriate face mask.  I strongly recommended she take either of the vaccines available through local drugstores based on updated information on millions of Americans treated with the  Moderna and [employer] products  which have proven both safe and  effective even against the new delta variant.

## 2020-07-06 LAB — TSH: TSH: 6.4 — AB (ref <=5.90)

## 2020-07-17 ENCOUNTER — Telehealth: Payer: Self-pay | Admitting: "Endocrinology

## 2020-07-17 MED ORDER — LANTUS SOLOSTAR 100 UNIT/ML ~~LOC~~ SOPN: 50.0000 [IU] | PEN_INJECTOR | Freq: Every day | SUBCUTANEOUS | 2 refills | Status: DC

## 2020-07-17 NOTE — Telephone Encounter (Signed)
Patient is calling and states while Dr. Fransico Him was out of the office her sugar was over 200 and she seen her PCP and her lantus was upped from 20 units to 50 units and she states she is having a hard time getting it from her pharmacy now. Please contact pt.

## 2020-07-17 NOTE — Telephone Encounter (Signed)
Called pt requested she call us back with the past three days of blood glucose readings.

## 2020-07-18 NOTE — Telephone Encounter (Signed)
Discussed with pt, understanding voiced. 

## 2020-07-18 NOTE — Telephone Encounter (Signed)
Kim, I want her to increase Lantus to 60 units qhs.

## 2020-07-18 NOTE — Telephone Encounter (Signed)
7/19 253 AM   7/20 217 AM  7/22 254 AM   7/23 242 AM  7/24 264 AM   7/26 181 AM 90 PM  7/27 179 AM

## 2020-07-20 ENCOUNTER — Ambulatory Visit (HOSPITAL_COMMUNITY): Payer: Medicaid Other

## 2020-08-03 ENCOUNTER — Other Ambulatory Visit (HOSPITAL_COMMUNITY): Payer: Self-pay | Admitting: Psychiatry

## 2020-08-07 ENCOUNTER — Encounter (HOSPITAL_COMMUNITY): Payer: Self-pay | Admitting: Psychiatry

## 2020-08-07 ENCOUNTER — Other Ambulatory Visit: Payer: Self-pay

## 2020-08-07 ENCOUNTER — Telehealth (INDEPENDENT_AMBULATORY_CARE_PROVIDER_SITE_OTHER): Payer: Medicaid Other | Admitting: Psychiatry

## 2020-08-07 ENCOUNTER — Telehealth: Payer: Self-pay | Admitting: "Endocrinology

## 2020-08-07 DIAGNOSIS — F3162 Bipolar disorder, current episode mixed, moderate: Secondary | ICD-10-CM

## 2020-08-07 DIAGNOSIS — F902 Attention-deficit hyperactivity disorder, combined type: Secondary | ICD-10-CM

## 2020-08-07 MED ORDER — ZOLPIDEM TARTRATE 10 MG PO TABS: 10.0000 mg | ORAL_TABLET | Freq: Every evening | ORAL | 2 refills | Status: DC | PRN

## 2020-08-07 MED ORDER — CARIPRAZINE HCL 3 MG PO CAPS: 3.0000 mg | ORAL_CAPSULE | Freq: Every day | ORAL | 2 refills | Status: DC

## 2020-08-07 MED ORDER — ALPRAZOLAM 1 MG PO TABS: 1.0000 mg | ORAL_TABLET | Freq: Three times a day (TID) | ORAL | 2 refills | Status: DC | PRN

## 2020-08-07 MED ORDER — DULOXETINE HCL 60 MG PO CPEP: 60.0000 mg | ORAL_CAPSULE | Freq: Two times a day (BID) | ORAL | 2 refills | Status: DC

## 2020-08-07 MED ORDER — LISDEXAMFETAMINE DIMESYLATE 70 MG PO CAPS: 70.0000 mg | ORAL_CAPSULE | Freq: Every day | ORAL | 0 refills | Status: DC

## 2020-08-07 MED ORDER — LISDEXAMFETAMINE DIMESYLATE 70 MG PO CAPS: 70.0000 mg | ORAL_CAPSULE | Freq: Every morning | ORAL | 0 refills | Status: DC

## 2020-08-07 NOTE — Telephone Encounter (Signed)
Pt needs a return call regarding her sugar. She does not think the Tradjenta or the glipizide is working. She said that the last 2 weeks her sugar has been over 200.

## 2020-08-07 NOTE — Telephone Encounter (Signed)
Patient notified, verbalized understanding

## 2020-08-07 NOTE — Telephone Encounter (Signed)
She needs insulin more than she needs oral meds for now, advise her to increase Lantus to 80 units, keep oral meds same.

## 2020-08-07 NOTE — Telephone Encounter (Signed)
Per patient fasting glucose in morning are as follows, 8/4 326,8/5 221, 8/9 219, 8/10 229, 8/12 219, 8/14 208, 8/15 216, 8/16 207. Patient is taking medications as prescribed, wondering if she needs to increase her oral medications as she doesn't want to have to increase the Lantus, Please advise.
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
## 2020-08-08 ENCOUNTER — Ambulatory Visit: Payer: Medicaid Other | Admitting: Gastroenterology

## 2020-08-08 ENCOUNTER — Other Ambulatory Visit: Payer: Self-pay

## 2020-08-08 DIAGNOSIS — E119 Type 2 diabetes mellitus without complications: Secondary | ICD-10-CM

## 2020-08-08 MED ORDER — LANTUS SOLOSTAR 100 UNIT/ML ~~LOC~~ SOPN: 80.0000 [IU] | PEN_INJECTOR | Freq: Every day | SUBCUTANEOUS | 2 refills | Status: DC

## 2020-08-08 NOTE — Telephone Encounter (Signed)
Pt is calling and states she needs a new rx sent in the it stating 80 units.  [facility], [place]. - [place], [place] - [address] Phone:  [phone]  Fax:  [phone]

## 2020-08-08 NOTE — Telephone Encounter (Signed)
Sent in

## 2020-08-11 ENCOUNTER — Telehealth (INDEPENDENT_AMBULATORY_CARE_PROVIDER_SITE_OTHER): Payer: Medicaid Other | Admitting: Nurse Practitioner

## 2020-08-11 ENCOUNTER — Encounter: Payer: Self-pay | Admitting: Nurse Practitioner

## 2020-08-11 DIAGNOSIS — E119 Type 2 diabetes mellitus without complications: Secondary | ICD-10-CM | POA: Diagnosis not present

## 2020-08-11 DIAGNOSIS — E782 Mixed hyperlipidemia: Secondary | ICD-10-CM

## 2020-08-11 MED ORDER — SITAGLIPTIN PHOSPHATE 50 MG PO TABS: 50.0000 mg | ORAL_TABLET | Freq: Every day | ORAL | 3 refills | Status: DC

## 2020-08-11 MED ORDER — LANTUS SOLOSTAR 100 UNIT/ML ~~LOC~~ SOPN: 90.0000 [IU] | PEN_INJECTOR | Freq: Every day | SUBCUTANEOUS | 2 refills | Status: DC

## 2020-08-11 MED ORDER — FENOFIBRATE 145 MG PO TABS: 145.0000 mg | ORAL_TABLET | Freq: Every day | ORAL | 3 refills | Status: DC

## 2020-08-11 NOTE — Progress Notes (Signed)
08/11/2020, 11:07 AM Endocrinology Follow Up Visit  TELEHEALTH VISIT: The patient is being engaged in telehealth visit due to COVID-19.  This type of visit limits physical examination significantly, and thus is not preferable over face-to-face encounters.  I connected with  Catherine Howard on 08/11/20 by a video enabled telemedicine application and verified that I am speaking with the correct person using two identifiers.   I discussed the limitations of evaluation and management by telemedicine. The patient expressed understanding and agreed to proceed.    The participants involved in this visit include: Dani Gobble, NP located at [facility] and Mykenzie Ebanks located at their personal residence listed.    Subjective:    Patient ID: Catherine Howard, female    DOB: [birth year].  Catherine Howard is being seen in follow up for management of currently uncontrolled symptomatic diabetes requested by  Smith Robert, MD.   Past Medical History:  Diagnosis Date   Elevated cholesterol    GERD (gastroesophageal reflux disease)    Thyroid disease    Vitamin D deficiency     Past Surgical History:  Procedure Laterality Date   CESAREAN SECTION     DILATION AND CURETTAGE OF UTERUS      Social History   Socioeconomic History   Marital status: Single    Spouse name: Not on file   Number of children: Not on file   Years of education: Not on file   Highest education level: Not on file  Occupational History   Not on file  Tobacco Use   Smoking status: Current Every Day Smoker    Packs/day: 2.00    Years: 32.00    Pack years: 64.00    Types: Cigarettes   Smokeless tobacco: Never Used  Substance and Sexual Activity   Alcohol use: No    Alcohol/week: 0.0 standard drinks   Drug use: No   Sexual activity: Yes    Partners: Male    Birth  control/protection: None  Other Topics Concern   Not on file  Social History Narrative   Not on file   Social Determinants of Health   Financial Resource Strain:    Difficulty of Paying Living Expenses: Not on file  Food Insecurity:    Worried About Programme researcher, broadcasting/film/video in the Last Year: Not on file   [employer] of Food in the Last Year: Not on file  Transportation Needs:    Lack of Transportation (Medical): Not on file   Lack of Transportation (Non-Medical): Not on file  Physical Activity:    Days of Exercise per Week: Not on file   Minutes of Exercise per Session: Not on file  Stress:    Feeling of Stress : Not on file  Social Connections:    Frequency of Communication with Friends and Family: Not on file   Frequency of Social Gatherings with Friends and Family: Not on file   Attends Religious Services: Not on file   Active Member of Clubs or Organizations: Not on file   Attends Banker Meetings: Not on file   Marital Status:  Not on file    Family History  Problem Relation Age of Onset   Schizophrenia Father    Alcohol abuse Father    Diabetes Father    Hyperlipidemia Father    Alcohol abuse Paternal Uncle     Outpatient Encounter Medications as of 08/11/2020  Medication Sig   ACCU-CHEK AVIVA PLUS test strip SMARTSIG:Via Meter   Accu-Chek Softclix Lancets lancets SMARTSIG:Topical   ALPRAZolam (XANAX) 1 MG tablet Take 1 tablet (1 mg total) by mouth 3 (three) times daily as needed for anxiety.   Azelastine-Fluticasone (DYMISTA) 137-50 MCG/ACT SUSP Place 1 spray into the nose in the morning and at bedtime. One spray each nostril two times daily   butalbital-acetaminophen-caffeine (FIORICET) 50-325-40 MG tablet Take by mouth.   cariprazine (VRAYLAR) capsule Take 1 capsule (3 mg total) by mouth daily.   DULoxetine (CYMBALTA) 60 MG capsule Take 1 capsule (60 mg total) by mouth 2 (two) times daily.   esomeprazole (NEXIUM) 20 MG capsule  Take 20 mg by mouth daily at 12 noon.   glipiZIDE (GLUCOTROL) 5 MG tablet Take 1 tablet (5 mg total) by mouth daily before breakfast.   insulin glargine (LANTUS SOLOSTAR) 100 UNIT/ML Solostar Pen Inject 90 Units into the skin at bedtime.   Insulin Pen Needle (B-D ULTRAFINE III SHORT PEN) 31G X 8 MM MISC 1 each by Does not apply route as directed.   ipratropium (ATROVENT HFA) 17 MCG/ACT inhaler Inhale 2 puffs into the lungs every 6 (six) hours as needed for wheezing.   lisdexamfetamine (VYVANSE) 70 MG capsule Take 1 capsule (70 mg total) by mouth daily.   methocarbamol (ROBAXIN) 500 MG tablet Take 500 mg by mouth every 6 (six) hours as needed for muscle spasms.   nabumetone (RELAFEN) 500 MG tablet Take 500 mg by mouth 2 (two) times daily.   oxyCODONE-acetaminophen (PERCOCET) 10-325 MG tablet Take 1 tablet by mouth 2 (two) times daily as needed.   rosuvastatin (CRESTOR) 20 MG tablet Take 20 mg by mouth daily.   Vitamin D, Ergocalciferol, (DRISDOL) 1.25 MG (50000 UNIT) CAPS capsule Take by mouth.   zolpidem (AMBIEN) 10 MG tablet Take 1 tablet (10 mg total) by mouth at bedtime as needed for sleep.   [DISCONTINUED] insulin glargine (LANTUS SOLOSTAR) 100 UNIT/ML Solostar Pen Inject 80 Units into the skin daily.   [DISCONTINUED] TRADJENTA 5 MG TABS tablet    fenofibrate (TRICOR) 145 MG tablet Take 1 tablet (145 mg total) by mouth daily.   sitaGLIPtin (JANUVIA) 50 MG tablet Take 1 tablet (50 mg total) by mouth daily.   [DISCONTINUED] Cholecalciferol (VITAMIN D) 2000 UNITS tablet Take 2,000 Units by mouth daily.   [DISCONTINUED] lisdexamfetamine (VYVANSE) 70 MG capsule Take 1 capsule (70 mg total) by mouth in the morning.   No facility-administered encounter medications on file as of 08/11/2020.    ALLERGIES: No Known Allergies  VACCINATION STATUS:  There is no immunization history on file for this patient.  Diabetes She presents for her follow-up diabetic visit. She has type 2  diabetes mellitus. Onset time: She was diagnosed at approximate age of 44 years. Her disease course has been improving. Pertinent negatives for hypoglycemia include no confusion, headaches, nervousness/anxiousness, pallor, seizures or tremors. Associated symptoms include fatigue, polydipsia and polyuria. Pertinent negatives for diabetes include no chest pain, no foot ulcerations, no polyphagia and no visual change. There are no hypoglycemic complications. Symptoms are improving. There are no diabetic complications. Risk factors for coronary artery disease include diabetes mellitus, dyslipidemia, obesity,  sedentary lifestyle and tobacco exposure. Current diabetic treatment includes insulin injections and oral agent (dual therapy). She is compliant with treatment most of the time. Her weight is fluctuating minimally (Does not have a way to check her weight at home.). She is following a generally unhealthy diet. When asked about meal planning, she reported none. She has not had a previous visit with a dietitian (She declined referral to a dietitian.). She rarely participates in exercise. Her home blood glucose trend is decreasing steadily. Her breakfast blood glucose range is generally >200 mg/dl. Her bedtime blood glucose range is generally 130-140 mg/dl. (Patient is engaged in virtual visit today.  She read her logs to me via phone showing above target fasting glycemic profile and near target postprandial glycemic profile.  Her point-of-care A1c was not done prior to this visit due to it being changed to virtual.  Her previous A1c on 05/08/2020 was 7.2%.  She called the clinic in between visits for hyperglycemia in which her Lantus was increased to 80 units SQ nightly.  She reports that her insurance will no longer cover Tradjenta, but she still has some left.  She denies any episodes of hypoglycemia.) An ACE inhibitor/angiotensin II receptor blocker is not being taken. She does not see a podiatrist.Eye exam is  current.  Hyperlipidemia This is a chronic problem. The current episode started more than 1 year ago. The problem is uncontrolled. Recent lipid tests were reviewed and are high. Exacerbating diseases include diabetes and obesity. Factors aggravating her hyperlipidemia include fatty foods. Pertinent negatives include no chest pain, myalgias or shortness of breath. Current antihyperlipidemic treatment includes statins. The current treatment provides mild improvement of lipids. Compliance problems include adherence to diet.  Risk factors for coronary artery disease include diabetes mellitus, dyslipidemia, a sedentary lifestyle and obesity.     Review of Systems  Constitutional: Positive for fatigue. Negative for chills, fever and unexpected weight change.  HENT: Negative for trouble swallowing and voice change.   Eyes: Negative for visual disturbance.  Respiratory: Negative for cough, shortness of breath and wheezing.   Cardiovascular: Negative for chest pain, palpitations and leg swelling.  Gastrointestinal: Negative for diarrhea, nausea and vomiting.  Endocrine: Positive for polydipsia and polyuria. Negative for cold intolerance, heat intolerance and polyphagia.  Musculoskeletal: Negative for arthralgias and myalgias.  Skin: Negative for color change, pallor, rash and wound.  Neurological: Negative for tremors, seizures and headaches.  Psychiatric/Behavioral: Negative for confusion and suicidal ideas. The patient is not nervous/anxious.     Objective:     There were no vitals taken for this visit.  Wt Readings from Last 3 Encounters:  06/21/20 208 lb (94.3 kg)  06/09/20 207 lb (93.9 kg)  05/08/20 207 lb (93.9 kg)    BP Readings from Last 3 Encounters:  06/21/20 126/78  06/09/20 110/78  05/08/20 122/86     Physical Exam- Telehealth- significantly limited due to nature of visit  Constitutional: There is no height or weight on file to calculate BMI. , not in acute distress, normal  state of mind Respiratory: Adequate breathing efforts     CMP ( most recent) CMP     Component Value Date/Time   NA 139 07/14/2014 0958   K 4.5 07/14/2014 0958   CL 109 07/14/2014 0958   CO2 23 07/14/2014 0958   GLUCOSE 89 07/14/2014 0958   BUN 7 04/03/2020 0000   CREATININE 0.8 04/03/2020 0000   CREATININE 0.75 07/14/2014 0958   CALCIUM 9.5 07/14/2014 0958  Diabetic Labs (most recent): Lab Results  Component Value Date   HGBA1C 7.2 (A) 05/08/2020   HGBA1C 7.4 02/03/2020   HGBA1C 7.4 02/03/2020     Lipid Panel     Component Value Date/Time   CHOL 194 04/03/2020 0000   TRIG 276 (A) 04/03/2020 0000   HDL 50 04/03/2020 0000   LDLCALC 105 04/03/2020 0000      Assessment & Plan:   1. Type 2 diabetes mellitus without complication, without long-term current use of insulin (HCC)  - Merridith Dershem has currently uncontrolled symptomatic type 2 DM since  44 years of age.  Patient is engaged in virtual visit today.  She read her logs to me via phone showing above target fasting glycemic profile and near target postprandial glycemic profile.  Her point-of-care A1c was not done prior to this visit due to it being changed to virtual.  Her previous A1c on 05/08/2020 was 7.2%.  She called the clinic in between visits for hyperglycemia in which her Lantus was increased to 80 units SQ nightly.  She reports that her insurance will no longer cover Tradjenta, but she still has some left.  She denies any episodes of hypoglycemia.  - I had a long discussion with her about the progressive nature of diabetes and the pathology behind its complications. -her diabetes is complicated by obesity/sedentary life, chronic  Smoking, sleep apnea and she remains at a high risk for more acute and chronic complications which include CAD, CVA, CKD, retinopathy, and neuropathy. These are all discussed in detail with her.  - The patient admits there is a room for improvement in their diet and drink  choices. -  Suggestion is made for the patient to avoid simple carbohydrates from their diet including Cakes, Sweet Desserts / Pastries, Ice Cream, Soda (diet and regular), Sweet Tea, Candies, Chips, Cookies, Sweet Pastries,  Store Bought Juices, Alcohol in Excess of  1-2 drinks a day, Artificial Sweeteners, Coffee Creamer, and "Sugar-free" Products. This will help patient to have stable blood glucose profile and potentially avoid unintended weight gain.   - I encouraged the patient to switch to  unprocessed or minimally processed complex starch and increased protein intake (animal or plant source), fruits, and vegetables.   - Patient is advised to stick to a routine mealtimes to eat 3 meals  a day and avoid unnecessary snacks ( to snack only to correct hypoglycemia).  - she declined referral to CDE.   - I have approached her with the following individualized plan to manage  her diabetes and patient agrees:   Based on her presentation with persistent fasting hyperglycemia, she is advised to increase her dose of Lantus to 90 units SQ daily at bedtime.  She is advised to continue glipizide 5 mg p.o. daily with breakfast, finish her supply of Tradjenta and then start Januvia 50 mg p.o. daily if insurance will cover. -She is advised to monitor blood glucose at least twice a day, before breakfast and before bed.  She is advised to call the clinic if blood glucose levels are less than 70 or above 200 for 3 tests in a row.  - Specific targets for  A1c;  LDL, HDL,  and Triglycerides were discussed with the patient.  2) Blood Pressure /Hypertension: Her previous blood pressure readings in the clinic have been controlled to target.  She does have a way to check blood pressure at home, however has not checked her blood pressure in a few days.  She is  not currently on any antihypertensive medications at this time.  She has a follow-up with her PCP in October.  She will be considered for low-dose lisinopril for  renal protection next visit.    3) Lipids/Hyperlipidemia:  Review of her recent lipid panel from 04/03/2020 shows elevated LDL of 105, and elevated triglycerides of 276.  She is advised to continue Crestor 20 mg p.o. daily at bedtime.  Discussed and initiated fenofibrate 145 mg p.o. daily to better control triglyceride level.  Will recheck lipid profile on subsequent visits when appropriate.  4)  Weight/Diet:  Her previous BMI is 36.27-   clearly complicating her diabetes care.   she is  a candidate for weight loss. I discussed with her the fact that loss of 5 - 10% of her  current body weight will have the most impact on her diabetes management.  Exercise, and detailed carbohydrates information provided  -  detailed on discharge instructions.  The patient was counseled on the dangers of tobacco use, and was advised to quit.  Reviewed strategies to maximize success, including removing cigarettes and smoking materials from environment.   5) Chronic Care/Health Maintenance:  -she  is on Statin medications and  is encouraged to initiate and continue to follow up with Ophthalmology, Dentist,  Podiatrist at least yearly or according to recommendations, and advised to  Quit smoking. I have recommended yearly flu vaccine and pneumonia vaccine at least every 5 years; moderate intensity exercise for up to 150 minutes weekly; and  sleep for at least 7 hours a day.  - she is  advised to maintain close follow up with Smith Robert, MD for primary care needs, as well as her other providers for optimal and coordinated care.   - Time spent on this patient care encounter:  30 min, of which > 50% was spent in  counseling and the rest reviewing her blood glucose logs , discussing her hypoglycemia and hyperglycemia episodes, reviewing her current and  previous labs / studies  ( including abstraction from other facilities) and medications  doses and developing a  long term treatment plan and documenting her care.    Please refer to Patient Instructions for Blood Glucose Monitoring and Insulin/Medications Dosing Guide"  in media tab for additional information. Please  also refer to " Patient Self Inventory" in the Media  tab for reviewed elements of pertinent patient history.  Catherine Howard participated in the discussions, expressed understanding, and voiced agreement with the above plans.  All questions were answered to her satisfaction. she is encouraged to contact clinic should she have any questions or concerns prior to her return visit.   I spent 30 minutes dedicated to the care of this patient on the date of this encounter to include pre-visit review of records, face-to-face time with the patient, and post visit ordering of  testing.   Follow up plan: - Return in about 4 months (around 12/11/2020) for Virtual visit ok, Diabetes follow up, Previsit labs.  Ronny Bacon, FNP-BC [facility] Phone: [phone] Fax: [phone]   08/11/2020, 11:07 AM  This note was partially dictated with voice recognition software. Similar sounding words can be transcribed inadequately or may not  be corrected upon review.

## 2020-08-11 NOTE — Patient Instructions (Signed)

## 2020-08-14 ENCOUNTER — Telehealth: Payer: Self-pay | Admitting: "Endocrinology

## 2020-08-14 ENCOUNTER — Other Ambulatory Visit: Payer: Self-pay | Admitting: Nurse Practitioner

## 2020-08-14 MED ORDER — LINAGLIPTIN 5 MG PO TABS: 5.0000 mg | ORAL_TABLET | Freq: Every day | ORAL | 3 refills | Status: DC

## 2020-08-14 MED ORDER — SAXAGLIPTIN HCL 2.5 MG PO TABS
2.5000 mg | ORAL_TABLET | Freq: Every day | ORAL | 3 refills | Status: DC
Start: 2020-08-14 — End: 2020-08-14

## 2020-08-14 MED ORDER — SAXAGLIPTIN HCL 5 MG PO TABS: 2.5000 mg | ORAL_TABLET | Freq: Every day | ORAL | 3 refills | Status: DC

## 2020-08-14 MED ORDER — EMPAGLIFLOZIN 10 MG PO TABS: 10.0000 mg | ORAL_TABLET | Freq: Every day | ORAL | 3 refills | Status: DC

## 2020-08-14 NOTE — Telephone Encounter (Signed)
Patient said she is having to stick herself 2x of the lantus to get the correct dosage.  Please advise on if you have received anything on a PA for her Januvia.

## 2020-08-14 NOTE — Telephone Encounter (Signed)
So, unfortunately she will have to inject twice.  The Lantus pen will only dial up to 80 units as a safety mechanism.  I submitted an alternative to Januvia called Onglyza to the pharmacy.  I am waiting to hear back from them about it being covered or not.

## 2020-08-14 NOTE — Telephone Encounter (Signed)
Patient states that she is having to give herself 2 shots to equate to the 90 unit dosage of Lantus, please advise if there is something else she can use or it she will have to continue with 2 injections.

## 2020-08-14 NOTE — Telephone Encounter (Signed)
Patient made aware, verbalized understanding

## 2020-08-16 NOTE — Telephone Encounter (Signed)
Informed pt .

## 2020-08-16 NOTE — Telephone Encounter (Signed)
Patient would like to know if we have heard back about the Januvia. Also for the Lantus 90 units. The pharmacy states they have not received it yet at [facility] and looks like it says "no print" please advise.

## 2020-08-16 NOTE — Telephone Encounter (Signed)
So, her insurance will not cover any of the alternative medications.  They ALL require prior authorizations.  So I put her back on the Tradjenta since she has been on it before and put the request in for approval.  Lafonda Mosses worked on the prior [place] yesterday and we should be hearing from them anytime now.

## 2020-08-17 NOTE — Telephone Encounter (Signed)
Catherine Howard has been approved however Patient states that it is what she has been taking and is not helping with her blood sugars, stated that she was told by [facility] caseworker any of the oral medications for DM would need an approval.Please advise.

## 2020-08-18 NOTE — Telephone Encounter (Signed)
All of the alternative medications are in the same drug class and work similarly.  Its best to continue with the tradjenta for now.  We can discuss at next appt about changing to different class of meds.

## 2020-08-18 NOTE — Telephone Encounter (Signed)
Returned call to pt and left voicemail advising of this.

## 2020-08-23 ENCOUNTER — Ambulatory Visit: Payer: Medicaid Other | Admitting: Pulmonary Disease

## 2020-09-04 ENCOUNTER — Encounter: Payer: Self-pay | Admitting: Gastroenterology

## 2020-09-05 ENCOUNTER — Other Ambulatory Visit (HOSPITAL_COMMUNITY): Payer: Self-pay | Admitting: Emergency Medicine

## 2020-09-05 DIAGNOSIS — R519 Headache, unspecified: Secondary | ICD-10-CM

## 2020-09-11 ENCOUNTER — Ambulatory Visit: Payer: Medicaid Other | Admitting: "Endocrinology

## 2020-09-15 ENCOUNTER — Other Ambulatory Visit: Payer: Self-pay

## 2020-09-15 ENCOUNTER — Encounter (HOSPITAL_COMMUNITY): Payer: Self-pay

## 2020-09-15 ENCOUNTER — Ambulatory Visit (HOSPITAL_COMMUNITY)
Admission: RE | Admit: 2020-09-15 | Discharge: 2020-09-15 | Disposition: A | Discharge status: Home / Self Care | Payer: Medicaid Other | Source: Ambulatory Visit | Attending: Emergency Medicine | Admitting: Emergency Medicine

## 2020-09-15 DIAGNOSIS — R519 Headache, unspecified: Secondary | ICD-10-CM

## 2020-09-28 ENCOUNTER — Ambulatory Visit (HOSPITAL_COMMUNITY)
Admission: RE | Admit: 2020-09-28 | Discharge: 2020-09-28 | Disposition: A | Discharge status: Home / Self Care | Payer: Medicaid Other | Source: Ambulatory Visit | Attending: Emergency Medicine | Admitting: Emergency Medicine

## 2020-09-28 ENCOUNTER — Other Ambulatory Visit: Payer: Self-pay

## 2020-09-28 DIAGNOSIS — R519 Headache, unspecified: Secondary | ICD-10-CM | POA: Insufficient documentation

## 2020-09-28 MED ORDER — GADOBUTROL 1 MMOL/ML IV SOLN
10.0000 mL | Freq: Once | INTRAVENOUS | Status: AC | PRN
  Administered 2020-09-28: 10 mL via INTRAVENOUS

## 2020-10-02 ENCOUNTER — Ambulatory Visit (HOSPITAL_COMMUNITY): Payer: Medicaid Other

## 2020-10-05 ENCOUNTER — Telehealth (HOSPITAL_COMMUNITY): Payer: Medicaid Other | Admitting: Psychiatry

## 2020-10-09 ENCOUNTER — Other Ambulatory Visit: Payer: Self-pay

## 2020-10-09 ENCOUNTER — Encounter (HOSPITAL_COMMUNITY): Payer: Self-pay | Admitting: Psychiatry

## 2020-10-09 ENCOUNTER — Telehealth (INDEPENDENT_AMBULATORY_CARE_PROVIDER_SITE_OTHER): Payer: Medicaid Other | Admitting: Psychiatry

## 2020-10-09 DIAGNOSIS — F902 Attention-deficit hyperactivity disorder, combined type: Secondary | ICD-10-CM | POA: Diagnosis not present

## 2020-10-09 DIAGNOSIS — F3162 Bipolar disorder, current episode mixed, moderate: Secondary | ICD-10-CM

## 2020-10-09 MED ORDER — DULOXETINE HCL 60 MG PO CPEP: 60.0000 mg | ORAL_CAPSULE | Freq: Two times a day (BID) | ORAL | 2 refills | Status: DC

## 2020-10-09 MED ORDER — LISDEXAMFETAMINE DIMESYLATE 70 MG PO CAPS: 70.0000 mg | ORAL_CAPSULE | Freq: Every day | ORAL | 0 refills | Status: DC

## 2020-10-09 MED ORDER — ALPRAZOLAM 1 MG PO TABS
1.0000 mg | ORAL_TABLET | Freq: Three times a day (TID) | ORAL | 2 refills | Status: DC | PRN
Start: 2020-10-09 — End: 2020-11-01

## 2020-10-09 MED ORDER — LISDEXAMFETAMINE DIMESYLATE 70 MG PO CAPS
70.0000 mg | ORAL_CAPSULE | Freq: Every day | ORAL | 0 refills | Status: DC
Start: 2020-10-09 — End: 2020-11-01

## 2020-10-09 MED ORDER — CARIPRAZINE HCL 3 MG PO CAPS
3.0000 mg | ORAL_CAPSULE | Freq: Every day | ORAL | 2 refills | Status: DC
Start: 2020-10-09 — End: 2020-11-01
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
## 2020-10-10 LAB — LIPID PANEL
Cholesterol: 179 (ref 0–200)
HDL: 46 (ref 35–70)
LDL Cholesterol: 93
LDl/HDL Ratio: 3.9
Triglycerides: 278 — AB (ref 40–160)

## 2020-10-10 LAB — BASIC METABOLIC PANEL
BUN: 11 (ref 4–21)
CO2: 26 — AB (ref 13–22)
Chloride: 104 (ref 99–108)
Creatinine: 0.9 (ref 0.5–1.1)
Glucose: 192
Potassium: 4.4 (ref 3.4–5.3)
Sodium: 138 (ref 137–147)

## 2020-10-10 LAB — COMPREHENSIVE METABOLIC PANEL
Albumin: 4.2 (ref 3.5–5.0)
Calcium: 9.8 (ref 8.7–10.7)
GFR calc non Af Amer: 80
Globulin: 2.8

## 2020-10-10 LAB — TSH: TSH: 1.94 (ref 0.41–5.90)

## 2020-10-10 LAB — HEPATIC FUNCTION PANEL
ALT: 27 (ref 7–35)
AST: 32 (ref 13–35)
Alkaline Phosphatase: 104 (ref 25–125)
Bilirubin, Total: 0.4

## 2020-10-10 LAB — HEMOGLOBIN A1C: Hemoglobin A1C: 6.7

## 2020-10-17 ENCOUNTER — Telehealth (HOSPITAL_COMMUNITY): Payer: Self-pay | Admitting: *Deleted

## 2020-10-17 NOTE — Telephone Encounter (Signed)
Informed patient with what provider stated and she verbalized understanding.  Per pt she did not want to sch therapy with this office as of yet. Per pt she will try again with [facility] about her therapy first.

## 2020-10-17 NOTE — Telephone Encounter (Signed)
I wouldn't advise it, this dosage is appropriate for her diagnosis, the higher doses are for schizophrenia. I've asked her to get into therapy

## 2020-10-17 NOTE — Telephone Encounter (Signed)
Patient called stating she thinks she needs to up her Vraylar. Per pt it's working but she is still snapping at people and her mood is out there.

## 2020-10-20 ENCOUNTER — Telehealth: Payer: Self-pay | Admitting: "Endocrinology

## 2020-10-20 NOTE — Telephone Encounter (Signed)
Can we schedule an in-office appointment with her next week to discuss?  I tried to get her started on Januvia last time, but it required a prior auth and is in the same drug class as Tradjenta (which she had been on before and complained that it didn't help her).  Actos is a safe medication but I doubt we will achieve control of her diabetes without the use of insulin.  Even with Actos, Januvia, and Glipizide, she will most likely need some basal insulin.  We need to discuss face-face.

## 2020-10-20 NOTE — Telephone Encounter (Signed)
Patient stated that she went her her PCP and was placed on Actos 30mg  starting 08/29/2020 along with er Glipizide 5mg  tabs that she takes 2 of. Her bg readings that she gave are as follows, 9/4 256, 9/6 208, 9/14 227 am 217 eve, 9/15 204,9/30 213, 10/3 227, 10/14 224, 10/15 228, 10/19 216, 10/23 200, 10/29 208. States that PCP suggest we get her on Januvia (I believe it requires a prior auth?), also stated that she is not taking insulin and does not want to take insulin. Please advise

## 2020-10-20 NOTE — Telephone Encounter (Signed)
Pt is calling and requesting nurse to give her a call back in regards to [facility] putting her on Januvia

## 2020-10-20 NOTE — Telephone Encounter (Signed)
I returned call, she will be in on Monday

## 2020-10-23 ENCOUNTER — Encounter: Payer: Self-pay | Admitting: "Endocrinology

## 2020-10-23 ENCOUNTER — Ambulatory Visit (INDEPENDENT_AMBULATORY_CARE_PROVIDER_SITE_OTHER): Payer: Medicaid Other | Admitting: Nurse Practitioner

## 2020-10-23 ENCOUNTER — Telehealth (HOSPITAL_COMMUNITY): Payer: Self-pay | Admitting: *Deleted

## 2020-10-23 ENCOUNTER — Other Ambulatory Visit: Payer: Self-pay

## 2020-10-23 ENCOUNTER — Encounter: Payer: Self-pay | Admitting: Nurse Practitioner

## 2020-10-23 VITALS — BP 136/84 | HR 108 | Wt 220.0 lb

## 2020-10-23 DIAGNOSIS — E782 Mixed hyperlipidemia: Secondary | ICD-10-CM

## 2020-10-23 DIAGNOSIS — F17200 Nicotine dependence, unspecified, uncomplicated: Secondary | ICD-10-CM

## 2020-10-23 DIAGNOSIS — E119 Type 2 diabetes mellitus without complications: Secondary | ICD-10-CM

## 2020-10-23 MED ORDER — GLIPIZIDE 5 MG PO TABS: 10.0000 mg | ORAL_TABLET | Freq: Two times a day (BID) | ORAL | 3 refills | Status: DC

## 2020-10-23 MED ORDER — SITAGLIPTIN PHOSPHATE 50 MG PO TABS: 50.0000 mg | ORAL_TABLET | Freq: Every day | ORAL | 3 refills | Status: DC

## 2020-10-23 NOTE — Telephone Encounter (Signed)
She is already on maximum doses of all meds. I have advised her to see a therapist

## 2020-10-23 NOTE — Patient Instructions (Signed)

## 2020-10-23 NOTE — Telephone Encounter (Signed)
Per pt she just found out her dad has lung cancer and she would like to be put on something. Per pt she knows provider could not up her Vrylar. Per pt she is taking all her script as prescribed.

## 2020-10-23 NOTE — Telephone Encounter (Signed)
Informed patient with what provider stated and she verbalized understanding.  

## 2020-10-23 NOTE — Progress Notes (Signed)
10/23/2020, 11:07 AM Endocrinology Follow Up Visit   Subjective:    Patient ID: Catherine Howard, female    DOB: Jul 28, 1976.  Catherine Howard is being seen in follow up for management of currently uncontrolled symptomatic diabetes requested by  Smith Robert, MD.   Past Medical History:  Diagnosis Date  . Elevated cholesterol   . GERD (gastroesophageal reflux disease)   . Thyroid disease   . Vitamin D deficiency     Past Surgical History:  Procedure Laterality Date  . CESAREAN SECTION    . DILATION AND CURETTAGE OF UTERUS      Social History   Socioeconomic History  . Marital status: Single    Spouse name: Not on file  . Number of children: Not on file  . Years of education: Not on file  . Highest education level: Not on file  Occupational History  . Not on file  Tobacco Use  . Smoking status: Current Every Day Smoker    Packs/day: 2.00    Years: 32.00    Pack years: 64.00    Types: Cigarettes  . Smokeless tobacco: Never Used  Substance and Sexual Activity  . Alcohol use: No    Alcohol/week: 0.0 standard drinks  . Drug use: No  . Sexual activity: Yes    Partners: Male    Birth control/protection: None  Other Topics Concern  . Not on file  Social History Narrative  . Not on file   Social Determinants of Health   Financial Resource Strain:   . Difficulty of Paying Living Expenses: Not on file  Food Insecurity:   . Worried About Programme researcher, broadcasting/film/video in the Last Year: Not on file  . Ran Out of Food in the Last Year: Not on file  Transportation Needs:   . Lack of Transportation (Medical): Not on file  . Lack of Transportation (Non-Medical): Not on file  Physical Activity:   . Days of Exercise per Week: Not on file  . Minutes of Exercise per Session: Not on file  Stress:   . Feeling of Stress : Not on file  Social Connections:   . Frequency of Communication with Friends  and Family: Not on file  . Frequency of Social Gatherings with Friends and Family: Not on file  . Attends Religious Services: Not on file  . Active Member of Clubs or Organizations: Not on file  . Attends Banker Meetings: Not on file  . Marital Status: Not on file    Family History  Problem Relation Age of Onset  . Schizophrenia Father   . Alcohol abuse Father   . Diabetes Father   . Hyperlipidemia Father   . Alcohol abuse Paternal Uncle     Outpatient Encounter Medications as of 10/23/2020  Medication Sig  . ACCU-CHEK AVIVA PLUS test strip SMARTSIG:Via Meter  . Accu-Chek Softclix Lancets lancets SMARTSIG:Topical  . ALLZITAL 25-325 MG TABS   . ALPRAZolam (XANAX) 1 MG tablet Take 1 tablet (1 mg total) by mouth 3 (three) times daily as needed for anxiety.  . Azelastine-Fluticasone (DYMISTA) 137-50 MCG/ACT SUSP Place  1 spray into the nose in the morning and at bedtime. One spray each nostril two times daily  . cariprazine (VRAYLAR) capsule Take 1 capsule (3 mg total) by mouth daily.  . DULoxetine (CYMBALTA) 60 MG capsule Take 1 capsule (60 mg total) by mouth 2 (two) times daily.  Marland Kitchen esomeprazole (NEXIUM) 20 MG capsule Take 20 mg by mouth daily at 12 noon.  . fenofibrate (TRICOR) 145 MG tablet Take 1 tablet (145 mg total) by mouth daily.  Marland Kitchen gabapentin (NEURONTIN) 800 MG tablet Take by mouth.  Marland Kitchen glipiZIDE (GLUCOTROL) 5 MG tablet Take 2 tablets (10 mg total) by mouth 2 (two) times daily before a meal.  . HYDROcodone-acetaminophen (NORCO) 10-325 MG tablet hydrocodone 10 mg-acetaminophen 325 mg tablet  Take 1 tablet every 4 hours by oral route.  . Insulin Pen Needle (B-D ULTRAFINE III SHORT PEN) 31G X 8 MM MISC 1 each by Does not apply route as directed.  Marland Kitchen ipratropium (ATROVENT HFA) 17 MCG/ACT inhaler Inhale 2 puffs into the lungs every 6 (six) hours as needed for wheezing.  Marland Kitchen levocetirizine (XYZAL) 5 MG tablet Take 5 mg by mouth at bedtime.  Marland Kitchen levothyroxine (SYNTHROID) 75 MCG  tablet Take 75 mcg by mouth daily.  Marland Kitchen lisdexamfetamine (VYVANSE) 70 MG capsule Take 1 capsule (70 mg total) by mouth daily.  . methocarbamol (ROBAXIN) 500 MG tablet Take 500 mg by mouth every 6 (six) hours as needed for muscle spasms.  Marland Kitchen MOVANTIK 25 MG TABS tablet   . nabumetone (RELAFEN) 500 MG tablet Take 500 mg by mouth 2 (two) times daily.  . pioglitazone (ACTOS) 30 MG tablet Take 30 mg by mouth daily.  . Vitamin D, Ergocalciferol, (DRISDOL) 1.25 MG (50000 UNIT) CAPS capsule Take by mouth.  . [DISCONTINUED] butalbital-acetaminophen-caffeine (FIORICET) 50-325-40 MG tablet Take by mouth.  . [DISCONTINUED] glipiZIDE (GLUCOTROL) 5 MG tablet Take 1 tablet (5 mg total) by mouth daily before breakfast. (Patient taking differently: Take 10 mg by mouth daily before breakfast. )  . [DISCONTINUED] insulin glargine (LANTUS SOLOSTAR) 100 UNIT/ML Solostar Pen Inject 90 Units into the skin at bedtime.  . sitaGLIPtin (JANUVIA) 50 MG tablet Take 1 tablet (50 mg total) by mouth daily.  . [DISCONTINUED] linagliptin (TRADJENTA) 5 MG TABS tablet Take 1 tablet (5 mg total) by mouth daily.  . [DISCONTINUED] lisdexamfetamine (VYVANSE) 70 MG capsule Take 1 capsule (70 mg total) by mouth daily.  . [DISCONTINUED] oxyCODONE-acetaminophen (PERCOCET) 10-325 MG tablet Take 1 tablet by mouth 2 (two) times daily as needed.  . [DISCONTINUED] rosuvastatin (CRESTOR) 20 MG tablet Take 20 mg by mouth daily.   No facility-administered encounter medications on file as of 10/23/2020.    ALLERGIES: No Known Allergies  VACCINATION STATUS:  There is no immunization history on file for this patient.  Diabetes She presents for her follow-up diabetic visit. She has type 2 diabetes mellitus. Onset time: She was diagnosed at approximate age of 8 years. Her disease course has been improving. Pertinent negatives for hypoglycemia include no confusion, headaches, nervousness/anxiousness, pallor, seizures or tremors. Associated symptoms  include fatigue and polyuria. Pertinent negatives for diabetes include no chest pain, no foot ulcerations, no polydipsia, no polyphagia, no visual change and no weight loss. There are no hypoglycemic complications. Symptoms are improving. There are no diabetic complications. Risk factors for coronary artery disease include diabetes mellitus, dyslipidemia, obesity, sedentary lifestyle, tobacco exposure and hypertension. Current diabetic treatment includes insulin injections and oral agent (dual therapy). She is compliant with treatment most of  the time. Her weight is increasing steadily (.). She is following a generally unhealthy diet. When asked about meal planning, she reported none. She has not had a previous visit with a dietitian (She declined referral to a dietitian.). She rarely participates in exercise. Her home blood glucose trend is increasing steadily. Her breakfast blood glucose range is generally >200 mg/dl. Her overall blood glucose range is >200 mg/dl. (She presents today with no meter or logs to review.  Since last visit she has been to her PCP who increased her Glipizide to 10 mg po twice daily, added Actos 30 mg po daily, and stopped her Lantus (due to complaints of excessive bruising and pain at injection sites).  She has not had any Lantus since mid September.  She does monitor her glucose twice daily with averages in the 200's.  Her most recent A1C on 10/10/20 was 6.7%, improving from last visit of 7%.  She denies any episodes of hypoglycemia.  She wishes to avoid any injectable medications at this time.) An ACE inhibitor/angiotensin II receptor blocker is not being taken. She does not see a podiatrist.Eye exam is current.  Hyperlipidemia This is a chronic problem. The current episode started more than 1 year ago. The problem is uncontrolled (yet improving). Recent lipid tests were reviewed and are high. Exacerbating diseases include diabetes, hypothyroidism and obesity. Factors aggravating her  hyperlipidemia include fatty foods. Pertinent negatives include no chest pain, myalgias or shortness of breath. Current antihyperlipidemic treatment includes statins and fibric acid derivatives. The current treatment provides moderate improvement of lipids. Compliance problems include adherence to diet and adherence to exercise.  Risk factors for coronary artery disease include diabetes mellitus, dyslipidemia, a sedentary lifestyle, obesity and hypertension.     Review of systems  Constitutional: + steadily increasing body weight,  current Body mass index is 38.36 kg/m. , no fatigue, no subjective hyperthermia, no subjective hypothermia Eyes: no blurry vision, no xerophthalmia ENT: no sore throat, no nodules palpated in throat, no dysphagia/odynophagia, no hoarseness Cardiovascular: no chest pain, no shortness of breath, no palpitations, no leg swelling Respiratory: no cough, no shortness of breath Gastrointestinal: no nausea/vomiting/diarrhea Musculoskeletal: no muscle/joint aches Skin: no rashes, no hyperemia Neurological: no tremors, no numbness, no tingling, no dizziness Psychiatric: no depression, no anxiety, history of bipolar disorder- controlled  Objective:     BP 136/84 (BP Location: Right Arm, Patient Position: Sitting)   Pulse (!) 108   Wt 220 lb (99.8 kg)   BMI 38.36 kg/m   Wt Readings from Last 3 Encounters:  10/23/20 220 lb (99.8 kg)  06/21/20 208 lb (94.3 kg)  06/09/20 207 lb (93.9 kg)    BP Readings from Last 3 Encounters:  10/23/20 136/84  06/21/20 126/78  06/09/20 110/78    Physical Exam- Limited  Constitutional:  Body mass index is 38.36 kg/m. , not in acute distress, normal state of mind Eyes:  EOMI, no exophthalmos Neck: Supple Thyroid: No gross goiter Cardiovascular: tachycardic, no murmers, rubs, or gallops, + mild bilateral pitting edema Respiratory: Adequate breathing efforts, no crackles, rales, rhonchi, or wheezing Musculoskeletal: no gross  deformities, strength intact in all four extremities, no gross restriction of joint movements Skin:  no rashes, no hyperemia Neurological: no tremor with outstretched hands     CMP ( most recent) CMP     Component Value Date/Time   NA 138 10/10/2020 0000   K 4.4 10/10/2020 0000   CL 104 10/10/2020 0000   CO2 26 (A) 10/10/2020 0000  GLUCOSE 89 07/14/2014 0958   BUN 11 10/10/2020 0000   CREATININE 0.9 10/10/2020 0000   CREATININE 0.75 07/14/2014 0958   CALCIUM 9.8 10/10/2020 0000   ALBUMIN 4.2 10/10/2020 0000   AST 32 10/10/2020 0000   ALT 27 10/10/2020 0000   ALKPHOS 104 10/10/2020 0000   GFRNONAA 80 10/10/2020 0000     Diabetic Labs (most recent): Lab Results  Component Value Date   HGBA1C 6.7 10/10/2020   HGBA1C 7.2 (A) 05/08/2020   HGBA1C 7.4 02/03/2020   HGBA1C 7.4 02/03/2020     Lipid Panel     Component Value Date/Time   CHOL 179 10/10/2020 0000   TRIG 278 (A) 10/10/2020 0000   HDL 46 10/10/2020 0000   LDLCALC 93 10/10/2020 0000      Assessment & Plan:   1. Type 2 diabetes mellitus without complication, without long-term current use of insulin (HCC)  - Catherine Howard has currently uncontrolled symptomatic type 2 DM since  44 years of age.  She presents today with no meter or logs to review.  Since last visit she has been to her PCP who increased her Glipizide to 10 mg po twice daily, added Actos 30 mg po daily, and stopped her Lantus (due to complaints of excessive bruising and pain at injection sites).  She has not had any Lantus since mid September.  She does monitor her glucose twice daily with averages in the 200's.  Her most recent A1C on 10/10/20 was 6.7%, improving from last visit of 7%.  She denies any episodes of hypoglycemia.  She wishes to avoid any injectable medications at this time.  She also reports intermittent use of steroids for respiratory issues.  - I had a long discussion with her about the progressive nature of diabetes and the  pathology behind its complications. -her diabetes is complicated by obesity/sedentary life, chronic  Smoking, sleep apnea and she remains at a high risk for more acute and chronic complications which include CAD, CVA, CKD, retinopathy, and neuropathy. These are all discussed in detail with her.  - Nutritional counseling repeated at each appointment due to patients tendency to fall back in to old habits.  - The patient admits there is a room for improvement in their diet and drink choices. -  Suggestion is made for the patient to avoid simple carbohydrates from their diet including Cakes, Sweet Desserts / Pastries, Ice Cream, Soda (diet and regular), Sweet Tea, Candies, Chips, Cookies, Sweet Pastries,  Store Bought Juices, Alcohol in Excess of  1-2 drinks a day, Artificial Sweeteners, Coffee Creamer, and "Sugar-free" Products. This will help patient to have stable blood glucose profile and potentially avoid unintended weight gain.   - I encouraged the patient to switch to  unprocessed or minimally processed complex starch and increased protein intake (animal or plant source), fruits, and vegetables.   - Patient is advised to stick to a routine mealtimes to eat 3 meals  a day and avoid unnecessary snacks ( to snack only to correct hypoglycemia).  - she declined referral to CDE.   - I have approached her with the following individualized plan to manage  her diabetes and patient agrees:   - Based on her presentation with improving glycemic profile, she is advised to continue Glipizide 10 mg po twice daily with meals and continue Actos 30 mg po daily.  She will benefit from addition of DPP-4 inhibitor.  I discussed and initiated Januvia 50 mg po daily (she had previously been on  Tradjenta with good results but insurance would no longer cover it).  -Will discontinue Lantus given her desire to avoid injectables, for now.  She is aware that the addition of insulin may be necessary to achieve control of  diabetes to target.  -She is advised to continue monitoring glucose at least twice daily, before breakfast and before bed, and call the clinic if she has readings less than 70 or greater than 300 for 3 tests in a row.  - Specific targets for  A1c;  LDL, HDL,  and Triglycerides were discussed with the patient.  2) Blood Pressure /Hypertension: Her blood pressure is controlled to target.  She is not currently on any medications for hypertension at this time.  If BP is elevated on 3 separate occasions, she will be considered for low dose ACE/ARB for renal protection from DM.  3) Lipids/Hyperlipidemia:  Her most recent lipid panel from 08/03/20 shows improving triglycerides of 278 and LDL of 93.  She is advised to continue Crestor 20 mg po daily at bedtime and Fenofibrate 145 mg po daily.  Side effects and precautions discussed with her.  4)  Weight/Diet:  Her Body mass index is 38.36 kg/m.-   clearly complicating her diabetes care.   she is  a candidate for weight loss. I discussed with her the fact that loss of 5 - 10% of her  current body weight will have the most impact on her diabetes management.  Exercise, and detailed carbohydrates information provided  -  detailed on discharge instructions.  5) Chronic Care/Health Maintenance: -she  is on Statin medications and  is encouraged to initiate and continue to follow up with Ophthalmology, Dentist,  Podiatrist at least yearly or according to recommendations, and advised to  Quit smoking. I have recommended yearly flu vaccine and pneumonia vaccine at least every 5 years; moderate intensity exercise for up to 150 minutes weekly; and  sleep for at least 7 hours a day.  Smoking cessation instruction/counseling given:  counseled patient on the dangers of tobacco use, advised patient to stop smoking, and reviewed strategies to maximize success  - she is  advised to maintain close follow up with Smith Robert, MD for primary care needs, as well as her  other providers for optimal and coordinated care.  - Time spent on this patient care encounter:  35 min, of which > 50% was spent in  counseling and the rest reviewing her blood glucose logs , discussing her hypoglycemia and hyperglycemia episodes, reviewing her current and  previous labs / studies  ( including abstraction from other facilities) and medications  doses and developing a  long term treatment plan and documenting her care.   Please refer to Patient Instructions for Blood Glucose Monitoring and Insulin/Medications Dosing Guide"  in media tab for additional information. Please  also refer to " Patient Self Inventory" in the Media  tab for reviewed elements of pertinent patient history.  Catherine Howard participated in the discussions, expressed understanding, and voiced agreement with the above plans.  All questions were answered to her satisfaction. she is encouraged to contact clinic should she have any questions or concerns prior to her return visit.  Catherine Howard participated in the discussions, expressed understanding, and voiced agreement with the above plans.  All questions were answered to her satisfaction. she is encouraged to contact clinic should she have any questions or concerns prior to her return visit.    Follow up plan: - Return in about 3 months (around  01/23/2021) for Diabetes follow up with A1c in office, Bring glucometer and logs, No previsit labs.  Ronny Bacon, [facility] [address] [place], [place] [address] Phone: [phone] Fax: [phone]   10/23/2020, 11:07 AM

## 2020-10-24 ENCOUNTER — Ambulatory Visit: Payer: Medicaid Other | Admitting: Gastroenterology

## 2020-10-25 ENCOUNTER — Encounter: Payer: Self-pay | Admitting: Gastroenterology

## 2020-10-25 ENCOUNTER — Ambulatory Visit: Payer: Medicaid Other | Admitting: Gastroenterology

## 2020-10-25 ENCOUNTER — Telehealth (HOSPITAL_COMMUNITY): Payer: Self-pay | Admitting: *Deleted

## 2020-10-25 NOTE — Telephone Encounter (Signed)
Informed patient with what provider stated and she stated she will just stay on the Xanax because she didn't know.

## 2020-10-25 NOTE — Telephone Encounter (Signed)
Pt would like to know if provider could prescribe Ativan for her to sleep. Per pt she tried this med in the past and it worked and would like to know if Dr. Tenny Craw could prescribe it for her.

## 2020-10-25 NOTE — Telephone Encounter (Signed)
She would have to stop or cut down the xanax, they are both benzodiazepines

## 2020-10-27 ENCOUNTER — Telehealth: Payer: Self-pay

## 2020-10-27 NOTE — Telephone Encounter (Signed)
Patient made aware of Januvia approval [id number] good from 10/23/2020-04/21/2021

## 2020-10-27 NOTE — Telephone Encounter (Signed)
Pt requesting you to give her a call.

## 2020-10-31 ENCOUNTER — Telehealth: Payer: Self-pay

## 2020-10-31 ENCOUNTER — Telehealth (HOSPITAL_COMMUNITY): Payer: Self-pay | Admitting: *Deleted

## 2020-10-31 NOTE — Telephone Encounter (Signed)
She is calling a lot so she will need to make an appt to discuss

## 2020-10-31 NOTE — Telephone Encounter (Signed)
Yes, that is correct.  I want her to take the Glipizide, Actos, and Januvia.

## 2020-10-31 NOTE — Telephone Encounter (Signed)
Patient advised, verbalized understanding

## 2020-10-31 NOTE — Telephone Encounter (Signed)
Patient wants to verify that she should take the glipizide, actos and [place] daily. According to your office visit it looks like she was to continue with previous meds and take Januvia in addition, just verifying.

## 2020-10-31 NOTE — Telephone Encounter (Signed)
Per previous message, Patient called back to inform provider that she is wanting provider to put her Ativan. Per pt she is willing to stop taking her Xanax.

## 2020-10-31 NOTE — Telephone Encounter (Signed)
Informed patient and appt was made

## 2020-10-31 NOTE — Telephone Encounter (Signed)
Patient requesting you to call her, please

## 2020-11-01 ENCOUNTER — Telehealth (INDEPENDENT_AMBULATORY_CARE_PROVIDER_SITE_OTHER): Payer: Medicaid Other | Admitting: Psychiatry

## 2020-11-01 ENCOUNTER — Encounter (HOSPITAL_COMMUNITY): Payer: Self-pay | Admitting: Psychiatry

## 2020-11-01 ENCOUNTER — Other Ambulatory Visit: Payer: Self-pay

## 2020-11-01 DIAGNOSIS — F3162 Bipolar disorder, current episode mixed, moderate: Secondary | ICD-10-CM | POA: Diagnosis not present

## 2020-11-01 DIAGNOSIS — F902 Attention-deficit hyperactivity disorder, combined type: Secondary | ICD-10-CM | POA: Diagnosis not present

## 2020-11-01 MED ORDER — DULOXETINE HCL 60 MG PO CPEP: 60.0000 mg | ORAL_CAPSULE | Freq: Two times a day (BID) | ORAL | 2 refills | Status: DC

## 2020-11-01 MED ORDER — LISDEXAMFETAMINE DIMESYLATE 70 MG PO CAPS
70.0000 mg | ORAL_CAPSULE | Freq: Every day | ORAL | 0 refills | Status: DC
Start: 2020-11-01 — End: 2021-01-08

## 2020-11-01 MED ORDER — LISDEXAMFETAMINE DIMESYLATE 70 MG PO CAPS: 70.0000 mg | ORAL_CAPSULE | Freq: Every morning | ORAL | 0 refills | Status: DC

## 2020-11-01 MED ORDER — CARIPRAZINE HCL 3 MG PO CAPS
3.0000 mg | ORAL_CAPSULE | Freq: Every day | ORAL | 2 refills | Status: DC
Start: 2020-11-01 — End: 2021-01-08

## 2020-11-01 MED ORDER — LORAZEPAM 1 MG PO TABS: 1.0000 mg | ORAL_TABLET | Freq: Three times a day (TID) | ORAL | 2 refills | Status: DC | PRN
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
## 2020-11-09 MED ORDER — SITAGLIPTIN PHOSPHATE 100 MG PO TABS: 100.0000 mg | ORAL_TABLET | Freq: Every day | ORAL | 3 refills | Status: DC

## 2020-11-09 NOTE — Telephone Encounter (Signed)
Patient wants you to increase her Januvia to 100mg  daily, states that BG this morning was 200, can't find what she did with yesterdays readings, 11/16 am 147, 11/13 pm 191, 11/14 am 184, pm 119, 11/15 am 189, pm 122. Please advise.

## 2020-11-09 NOTE — Telephone Encounter (Signed)
Pt requesting a call back from Lafonda Mosses in regards to her Januvia dosage.

## 2020-11-09 NOTE — Telephone Encounter (Signed)
That's fine, we can increase dose.  I will send in Rx.  She can double up on her 50 mg tabs until she runs out.

## 2020-11-09 NOTE — Telephone Encounter (Signed)
Returned call to patient to advise, left a Engineer, technical sales

## 2020-11-22 ENCOUNTER — Other Ambulatory Visit (HOSPITAL_COMMUNITY): Payer: Self-pay | Admitting: Psychiatry

## 2020-11-22 ENCOUNTER — Telehealth (HOSPITAL_COMMUNITY): Payer: Self-pay | Admitting: Psychiatry

## 2020-11-22 ENCOUNTER — Telehealth (HOSPITAL_COMMUNITY): Payer: Self-pay | Admitting: *Deleted

## 2020-11-22 MED ORDER — ALPRAZOLAM 1 MG PO TABS: 1.0000 mg | ORAL_TABLET | Freq: Three times a day (TID) | ORAL | 1 refills | Status: DC | PRN

## 2020-11-22 NOTE — Telephone Encounter (Signed)
Spoke with patient and informed her with what provider stated and she verbalized understanding and agreed with what provider stated.

## 2020-11-22 NOTE — Telephone Encounter (Signed)
Patient calling stating she wants to go back on the Xanax and not the Ativan anymore. Per pt she feels like the Xanax makes her sleep a bit longer then the Ativan.

## 2020-11-22 NOTE — Telephone Encounter (Signed)
Open in error

## 2020-11-22 NOTE — Telephone Encounter (Signed)
According to PDMP she last filled Ativan on 11/10. I can send in the xanax but she will not be allowed to fill it until 12/10. She has asked for repeated changes in benzos, this reflects poorly on my prescribing pattern, I WILL NOT be able to change it again. Please let her know

## 2020-11-23 ENCOUNTER — Telehealth: Payer: Self-pay | Admitting: Internal Medicine

## 2020-11-23 NOTE — Telephone Encounter (Signed)
Fine with me

## 2020-11-23 NOTE — Telephone Encounter (Signed)
Called and spoke with patient who would like to switch providers from Dr. Sherene Sires. She is seen at [facility] office, I advised patient that there is only 2 other options at that office. She expressed understanding and said that she didn't have a preference.  Dr. Sherene Sires are you ok with this?  Dr. Craige Cotta or Dr. Vassie Loll would you see this patient?

## 2020-11-24 NOTE — Telephone Encounter (Signed)
I am okay with switching. 

## 2020-11-24 NOTE — Telephone Encounter (Signed)
I have called and left a VM for the pt to make her aware that providers are fine if she wants to change providers.  Advised her to call our office when she needs to schedule a visit.

## 2020-12-07 ENCOUNTER — Telehealth: Payer: Self-pay

## 2020-12-07 NOTE — Telephone Encounter (Signed)
Patient advised, verbalized understanding

## 2020-12-07 NOTE — Telephone Encounter (Signed)
Patient concerned with BG levels in AM, stated that she does not eat before going to bed and does not eat in the middle of the night, readings are as follows.. 12/10 21 am, 12/11 195 am, 12/12 200 am, 12/13 205 am, 12/14 am 201, hs 230, 12/15 198 am, 12/16 am 186. Patient has only been taking Januvia, once again I discussed the directions on taking actos and glipizide as well.

## 2020-12-07 NOTE — Telephone Encounter (Signed)
Patient is requesting a call back.

## 2020-12-07 NOTE — Telephone Encounter (Signed)
No changes.  Advise her to restart taking all meds as prescribed.

## 2020-12-08 ENCOUNTER — Encounter: Payer: Self-pay | Admitting: Internal Medicine

## 2020-12-13 ENCOUNTER — Ambulatory Visit: Payer: Medicaid Other | Admitting: Nurse Practitioner

## 2020-12-23 ENCOUNTER — Encounter: Payer: Self-pay | Admitting: Gastroenterology

## 2020-12-23 NOTE — Progress Notes (Deleted)
Referring Provider: Smith Robert, MD Primary Care Physician:  Smith Robert, MD Primary Gastroenterologist:  Dr. Marletta Lor  No chief complaint on file.   HPI:   Catherine Howard is a 45 y.o. female presenting today at the request of Smith Robert, MD for elevated LFTs.   Most recent LFTs on file in October 2021 wnl.  Previously, ALT slightly elevated at 37 in August 2021.  A1C improved from 7.4 in February 2021 to 7.2 in May and 6.7 in October 2021.   Past Medical History:  Diagnosis Date  . Elevated cholesterol   . GERD (gastroesophageal reflux disease)   . Thyroid disease   . Vitamin D deficiency     Past Surgical History:  Procedure Laterality Date  . CESAREAN SECTION    . DILATION AND CURETTAGE OF UTERUS      Current Outpatient Medications  Medication Sig Dispense Refill  . ACCU-CHEK AVIVA PLUS test strip SMARTSIG:Via Meter    . Accu-Chek Softclix Lancets lancets SMARTSIG:Topical    . ALLZITAL 25-325 MG TABS     . ALPRAZolam (XANAX) 1 MG tablet Take 1 tablet (1 mg total) by mouth 3 (three) times daily as needed for anxiety. 90 tablet 1  . Azelastine-Fluticasone (DYMISTA) 137-50 MCG/ACT SUSP Place 1 spray into the nose in the morning and at bedtime. One spray each nostril two times daily    . cariprazine (VRAYLAR) capsule Take 1 capsule (3 mg total) by mouth daily. 30 capsule 2  . DULoxetine (CYMBALTA) 60 MG capsule Take 1 capsule (60 mg total) by mouth 2 (two) times daily. 180 capsule 2  . esomeprazole (NEXIUM) 20 MG capsule Take 20 mg by mouth daily at 12 noon.    . fenofibrate (TRICOR) 145 MG tablet Take 1 tablet (145 mg total) by mouth daily. 90 tablet 3  . gabapentin (NEURONTIN) 800 MG tablet Take by mouth.    Marland Kitchen glipiZIDE (GLUCOTROL) 5 MG tablet Take 2 tablets (10 mg total) by mouth 2 (two) times daily before a meal. 180 tablet 3  . HYDROcodone-acetaminophen (NORCO) 10-325 MG tablet hydrocodone 10 mg-acetaminophen 325 mg tablet  Take 1 tablet every 4 hours by oral  route.    . Insulin Pen Needle (B-D ULTRAFINE III SHORT PEN) 31G X 8 MM MISC 1 each by Does not apply route as directed. 100 each 2  . ipratropium (ATROVENT HFA) 17 MCG/ACT inhaler Inhale 2 puffs into the lungs every 6 (six) hours as needed for wheezing. 1 Inhaler 11  . levocetirizine (XYZAL) 5 MG tablet Take 5 mg by mouth at bedtime.    Marland Kitchen levothyroxine (SYNTHROID) 75 MCG tablet Take 75 mcg by mouth daily.    Marland Kitchen lisdexamfetamine (VYVANSE) 70 MG capsule Take 1 capsule (70 mg total) by mouth daily. 30 capsule 0  . lisdexamfetamine (VYVANSE) 70 MG capsule Take 1 capsule (70 mg total) by mouth in the morning. 30 capsule 0  . methocarbamol (ROBAXIN) 500 MG tablet Take 500 mg by mouth every 6 (six) hours as needed for muscle spasms.    Marland Kitchen MOVANTIK 25 MG TABS tablet     . nabumetone (RELAFEN) 500 MG tablet Take 500 mg by mouth 2 (two) times daily.    . pioglitazone (ACTOS) 30 MG tablet Take 30 mg by mouth daily.    . sitaGLIPtin (JANUVIA) 100 MG tablet Take 1 tablet (100 mg total) by mouth daily. 90 tablet 3  . Vitamin D, Ergocalciferol, (DRISDOL) 1.25 MG (50000 UNIT) CAPS capsule Take by mouth.  No current facility-administered medications for this visit.    Allergies as of 12/25/2020  . (No Known Allergies)    Family History  Problem Relation Age of Onset  . Schizophrenia Father   . Alcohol abuse Father   . Diabetes Father   . Hyperlipidemia Father   . Alcohol abuse Paternal Uncle     Social History   Socioeconomic History  . Marital status: Single    Spouse name: Not on file  . Number of children: Not on file  . Years of education: Not on file  . Highest education level: Not on file  Occupational History  . Not on file  Tobacco Use  . Smoking status: Current Every Day Smoker    Packs/day: 2.00    Years: 32.00    Pack years: 64.00    Types: Cigarettes  . Smokeless tobacco: Never Used  Substance and Sexual Activity  . Alcohol use: No    Alcohol/week: 0.0 standard drinks   . Drug use: No  . Sexual activity: Yes    Partners: Male    Birth control/protection: None  Other Topics Concern  . Not on file  Social History Narrative  . Not on file   Social Determinants of Health   Financial Resource Strain: Not on file  Food Insecurity: Not on file  Transportation Needs: Not on file  Physical Activity: Not on file  Stress: Not on file  Social Connections: Not on file  Intimate Partner Violence: Not on file    Review of Systems: Gen: Denies any fever, chills, fatigue, weight loss, lack of appetite.  CV: Denies chest pain, heart palpitations, peripheral edema, syncope.  Resp: Denies shortness of breath at rest or with exertion. Denies wheezing or cough.  GI: Denies dysphagia or odynophagia. Denies jaundice, hematemesis, fecal incontinence. GU : Denies urinary burning, urinary frequency, urinary hesitancy MS: Denies joint pain, muscle weakness, cramps, or limitation of movement.  Derm: Denies rash, itching, dry skin Psych: Denies depression, anxiety, memory loss, and confusion Heme: Denies bruising, bleeding, and enlarged lymph nodes.  Physical Exam: There were no vitals taken for this visit. General:   Alert and oriented. Pleasant and cooperative. Well-nourished and well-developed.  Head:  Normocephalic and atraumatic. Eyes:  Without icterus, sclera clear and conjunctiva pink.  Ears:  Normal auditory acuity. Nose:  No deformity, discharge,  or lesions. Mouth:  No deformity or lesions, oral mucosa pink.  Neck:  Supple, without mass or thyromegaly. Lungs:  Clear to auscultation bilaterally. No wheezes, rales, or rhonchi. No distress.  Heart:  S1, S2 present without murmurs appreciated.  Abdomen:  +BS, soft, non-tender and non-distended. No HSM noted. No guarding or rebound. No masses appreciated.  Rectal:  Deferred  Msk:  Symmetrical without gross deformities. Normal posture. Pulses:  Normal pulses noted. Extremities:  Without clubbing or  edema. Neurologic:  Alert and  oriented x4;  grossly normal neurologically. Skin:  Intact without significant lesions or rashes. Cervical Nodes:  No significant cervical adenopathy. Psych:  Alert and cooperative. Normal mood and affect.

## 2020-12-25 ENCOUNTER — Ambulatory Visit: Payer: Medicaid Other | Admitting: Gastroenterology

## 2020-12-25 ENCOUNTER — Encounter: Payer: Self-pay | Admitting: Internal Medicine

## 2020-12-25 NOTE — Progress Notes (Incomplete)
Referring Provider: Smith Robert, MD Primary Care Physician:  Smith Robert, MD Primary Gastroenterologist:  Dr. Marletta Lor  No chief complaint on file.   HPI:   Catherine Howard is a 45 y.o. female presenting today at the request of Smith Robert, MD for elevated LFTs.   Most recent LFTs on file in October 2021 wnl.  Previously, ALT slightly elevated at 37 in August 2021.  A1C improved from 7.4 in February 2021 to 7.2 in May and 6.7 in October 2021.    Past Medical History:  Diagnosis Date  . ADHD   . Bipolar I disorder (HCC)   . Elevated cholesterol   . GERD (gastroesophageal reflux disease)   . Thyroid disease   . Type 2 diabetes mellitus (HCC)   . Vitamin D deficiency     Past Surgical History:  Procedure Laterality Date  . CESAREAN SECTION    . DILATION AND CURETTAGE OF UTERUS      Current Outpatient Medications  Medication Sig Dispense Refill  . ACCU-CHEK AVIVA PLUS test strip SMARTSIG:Via Meter    . Accu-Chek Softclix Lancets lancets SMARTSIG:Topical    . ALLZITAL 25-325 MG TABS     . ALPRAZolam (XANAX) 1 MG tablet Take 1 tablet (1 mg total) by mouth 3 (three) times daily as needed for anxiety. 90 tablet 1  . Azelastine-Fluticasone (DYMISTA) 137-50 MCG/ACT SUSP Place 1 spray into the nose in the morning and at bedtime. One spray each nostril two times daily    . cariprazine (VRAYLAR) capsule Take 1 capsule (3 mg total) by mouth daily. 30 capsule 2  . DULoxetine (CYMBALTA) 60 MG capsule Take 1 capsule (60 mg total) by mouth 2 (two) times daily. 180 capsule 2  . esomeprazole (NEXIUM) 20 MG capsule Take 20 mg by mouth daily at 12 noon.    . fenofibrate (TRICOR) 145 MG tablet Take 1 tablet (145 mg total) by mouth daily. 90 tablet 3  . gabapentin (NEURONTIN) 800 MG tablet Take by mouth.    Marland Kitchen glipiZIDE (GLUCOTROL) 5 MG tablet Take 2 tablets (10 mg total) by mouth 2 (two) times daily before a meal. 180 tablet 3  . HYDROcodone-acetaminophen (NORCO) 10-325 MG tablet  hydrocodone 10 mg-acetaminophen 325 mg tablet  Take 1 tablet every 4 hours by oral route.    . Insulin Pen Needle (B-D ULTRAFINE III SHORT PEN) 31G X 8 MM MISC 1 each by Does not apply route as directed. 100 each 2  . ipratropium (ATROVENT HFA) 17 MCG/ACT inhaler Inhale 2 puffs into the lungs every 6 (six) hours as needed for wheezing. 1 Inhaler 11  . levocetirizine (XYZAL) 5 MG tablet Take 5 mg by mouth at bedtime.    Marland Kitchen levothyroxine (SYNTHROID) 75 MCG tablet Take 75 mcg by mouth daily.    Marland Kitchen lisdexamfetamine (VYVANSE) 70 MG capsule Take 1 capsule (70 mg total) by mouth daily. 30 capsule 0  . lisdexamfetamine (VYVANSE) 70 MG capsule Take 1 capsule (70 mg total) by mouth in the morning. 30 capsule 0  . methocarbamol (ROBAXIN) 500 MG tablet Take 500 mg by mouth every 6 (six) hours as needed for muscle spasms.    Marland Kitchen MOVANTIK 25 MG TABS tablet     . nabumetone (RELAFEN) 500 MG tablet Take 500 mg by mouth 2 (two) times daily.    . pioglitazone (ACTOS) 30 MG tablet Take 30 mg by mouth daily.    . sitaGLIPtin (JANUVIA) 100 MG tablet Take 1 tablet (100 mg total) by mouth daily.  90 tablet 3  . Vitamin D, Ergocalciferol, (DRISDOL) 1.25 MG (50000 UNIT) CAPS capsule Take by mouth.     No current facility-administered medications for this visit.    Allergies as of 12/25/2020  . (No Known Allergies)    Family History  Problem Relation Age of Onset  . Schizophrenia Father   . Alcohol abuse Father   . Diabetes Father   . Hyperlipidemia Father   . Alcohol abuse Paternal Uncle     Social History   Socioeconomic History  . Marital status: Single    Spouse name: Not on file  . Number of children: Not on file  . Years of education: Not on file  . Highest education level: Not on file  Occupational History  . Not on file  Tobacco Use  . Smoking status: Current Every Day Smoker    Packs/day: 2.00    Years: 32.00    Pack years: 64.00    Types: Cigarettes  . Smokeless tobacco: Never Used   Substance and Sexual Activity  . Alcohol use: No    Alcohol/week: 0.0 standard drinks  . Drug use: No  . Sexual activity: Yes    Partners: Male    Birth control/protection: None  Other Topics Concern  . Not on file  Social History Narrative  . Not on file   Social Determinants of Health   Financial Resource Strain: Not on file  Food Insecurity: Not on file  Transportation Needs: Not on file  Physical Activity: Not on file  Stress: Not on file  Social Connections: Not on file  Intimate Partner Violence: Not on file    Review of Systems: Gen: Denies any fever, chills, fatigue, weight loss, lack of appetite.  CV: Denies chest pain, heart palpitations, peripheral edema, syncope.  Resp: Denies shortness of breath at rest or with exertion. Denies wheezing or cough.  GI: Denies dysphagia or odynophagia. Denies jaundice, hematemesis, fecal incontinence. GU : Denies urinary burning, urinary frequency, urinary hesitancy MS: Denies joint pain, muscle weakness, cramps, or limitation of movement.  Derm: Denies rash, itching, dry skin Psych: Denies depression, anxiety, memory loss, and confusion Heme: Denies bruising, bleeding, and enlarged lymph nodes.  Physical Exam: There were no vitals taken for this visit. General:   Alert and oriented. Pleasant and cooperative. Well-nourished and well-developed.  Head:  Normocephalic and atraumatic. Eyes:  Without icterus, sclera clear and conjunctiva pink.  Ears:  Normal auditory acuity. Nose:  No deformity, discharge,  or lesions. Mouth:  No deformity or lesions, oral mucosa pink.  Neck:  Supple, without mass or thyromegaly. Lungs:  Clear to auscultation bilaterally. No wheezes, rales, or rhonchi. No distress.  Heart:  S1, S2 present without murmurs appreciated.  Abdomen:  +BS, soft, non-tender and non-distended. No HSM noted. No guarding or rebound. No masses appreciated.  Rectal:  Deferred  Msk:  Symmetrical without gross deformities.  Normal posture. Pulses:  Normal pulses noted. Extremities:  Without clubbing or edema. Neurologic:  Alert and  oriented x4;  grossly normal neurologically. Skin:  Intact without significant lesions or rashes. Cervical Nodes:  No significant cervical adenopathy. Psych:  Alert and cooperative. Normal mood and affect.

## 2020-12-28 ENCOUNTER — Telehealth: Payer: Self-pay

## 2020-12-28 NOTE — Telephone Encounter (Signed)
Patient notified of recommendations, will call if any further concerns, verbalized understanding

## 2020-12-28 NOTE — Telephone Encounter (Signed)
Patient called with the following readings, 1/6 am 196, 1/5 pm 163, am 163, 1/4 pm 150, am 182, 1/3 pm 127, am 198, 1/2 pm 132, am 187, 1/1 pm 217, am 173. Stated that she is taking Januvia, actos and glipizide, saw a commercial on TV with Jardience advertisement, was wondering if that would be beneficial or what recommendations you would have.

## 2020-12-28 NOTE — Telephone Encounter (Signed)
Tell her London Pepper is not a medication I would prescribe due to its severe side effects (Fourniers gangrene).  I suggest she continue with current medication regimen for now, as I can see a drastic improvement in her numbers.  Continue working on diet and exercise and we should not need to add on any other medications.  The next option for her would be basal insulin.

## 2020-12-29 ENCOUNTER — Other Ambulatory Visit (HOSPITAL_COMMUNITY): Payer: Self-pay | Admitting: Psychiatry

## 2021-01-08 ENCOUNTER — Telehealth (INDEPENDENT_AMBULATORY_CARE_PROVIDER_SITE_OTHER): Payer: No Typology Code available for payment source | Admitting: Psychiatry

## 2021-01-08 ENCOUNTER — Encounter (HOSPITAL_COMMUNITY): Payer: Self-pay | Admitting: Psychiatry

## 2021-01-08 ENCOUNTER — Other Ambulatory Visit: Payer: Self-pay

## 2021-01-08 ENCOUNTER — Telehealth (HOSPITAL_COMMUNITY): Payer: Self-pay | Admitting: Psychiatry

## 2021-01-08 DIAGNOSIS — F3162 Bipolar disorder, current episode mixed, moderate: Secondary | ICD-10-CM

## 2021-01-08 DIAGNOSIS — F902 Attention-deficit hyperactivity disorder, combined type: Secondary | ICD-10-CM

## 2021-01-08 MED ORDER — LISDEXAMFETAMINE DIMESYLATE 70 MG PO CAPS: 70.0000 mg | ORAL_CAPSULE | Freq: Every day | ORAL | 0 refills | Status: DC

## 2021-01-08 MED ORDER — ALPRAZOLAM 1 MG PO TABS: 1.0000 mg | ORAL_TABLET | Freq: Three times a day (TID) | ORAL | 4 refills | Status: DC | PRN

## 2021-01-08 MED ORDER — LISDEXAMFETAMINE DIMESYLATE 70 MG PO CAPS
70.0000 mg | ORAL_CAPSULE | Freq: Every morning | ORAL | 0 refills | Status: DC
Start: 2021-01-08 — End: 2021-01-22

## 2021-01-08 MED ORDER — LISDEXAMFETAMINE DIMESYLATE 70 MG PO CAPS
70.0000 mg | ORAL_CAPSULE | Freq: Every day | ORAL | 0 refills | Status: DC
Start: 2021-01-08 — End: 2021-04-02

## 2021-01-08 MED ORDER — CARIPRAZINE HCL 3 MG PO CAPS: 3.0000 mg | ORAL_CAPSULE | Freq: Every day | ORAL | 2 refills | Status: DC

## 2021-01-08 MED ORDER — DULOXETINE HCL 60 MG PO CPEP: 60.0000 mg | ORAL_CAPSULE | Freq: Two times a day (BID) | ORAL | 2 refills | Status: DC

## 2021-01-08 NOTE — Telephone Encounter (Signed)
Called to schedule f/u appt, left vm 
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
## 2021-01-18 ENCOUNTER — Other Ambulatory Visit (HOSPITAL_COMMUNITY): Payer: Self-pay | Admitting: Emergency Medicine

## 2021-01-18 DIAGNOSIS — Z1231 Encounter for screening mammogram for malignant neoplasm of breast: Secondary | ICD-10-CM

## 2021-01-22 ENCOUNTER — Ambulatory Visit (INDEPENDENT_AMBULATORY_CARE_PROVIDER_SITE_OTHER): Payer: Medicaid Other | Admitting: Pulmonary Disease

## 2021-01-22 ENCOUNTER — Other Ambulatory Visit: Payer: Self-pay

## 2021-01-22 ENCOUNTER — Encounter: Payer: Self-pay | Admitting: Pulmonary Disease

## 2021-01-22 VITALS — BP 124/78 | HR 97 | Temp 97.4°F | Ht 63.5 in | Wt 220.6 lb

## 2021-01-22 DIAGNOSIS — R058 Other specified cough: Secondary | ICD-10-CM

## 2021-01-22 DIAGNOSIS — K219 Gastro-esophageal reflux disease without esophagitis: Secondary | ICD-10-CM

## 2021-01-22 DIAGNOSIS — G4730 Sleep apnea, unspecified: Secondary | ICD-10-CM

## 2021-01-22 DIAGNOSIS — R053 Chronic cough: Secondary | ICD-10-CM | POA: Diagnosis not present

## 2021-01-22 DIAGNOSIS — G4733 Obstructive sleep apnea (adult) (pediatric): Secondary | ICD-10-CM | POA: Diagnosis not present

## 2021-01-22 DIAGNOSIS — Z9989 Dependence on other enabling machines and devices: Secondary | ICD-10-CM

## 2021-01-22 DIAGNOSIS — E669 Obesity, unspecified: Secondary | ICD-10-CM

## 2021-01-22 DIAGNOSIS — Z720 Tobacco use: Secondary | ICD-10-CM | POA: Diagnosis not present

## 2021-01-22 NOTE — Progress Notes (Signed)
[facility]  Chief Complaint  Patient presents with  . Follow-up    Non productive cough for about 3 months, shortness of breath at rest and with activity for past 3 months    Constitutional:  BP 124/78 (BP Location: Left Arm, Cuff Size: Normal)   Pulse 97   Temp (!) 97.4 F (36.3 C) (Other (Comment)) Comment (Src): wrist  Ht 5' 3.5" (1.613 m)   Wt 220 lb 9.6 oz (100.1 kg)   SpO2 95% Comment: Room air  BMI 38.46 kg/m   Past Medical History:  ADHD, Bipolar, HLD, GERD, Hypothyroidism, DM type 2, Vit D deficiency  Past Surgical History:  She  has a past surgical history that includes Cesarean section and Dilation and curettage of uterus.  Brief Summary:  Catherine Howard is a 45 y.o. female smoker with chronic cough and obstructive sleep apnea.      Subjective:   Previously seen by Dr. Sherene Sires.  She smokes more than 1 pack per day.  Started smoking more after she found out that her father has cancer.  Her mother had cancer also.  She tried nicotine replacement before.  She uses xyzal and dymista for allergies.  She gets throat irritation and feels like she has phlegm in her throat.  She has cough and wheezing.  Doesn't usually get up phlegm.  She has reflux and takes nexium.  Has been using trelegy and this helps some.  Tried singulair, but didn't help.  Uses CPAP nightly.  No issues with pressure setting.  Using nasal pillow mask.  Physical Exam:   Appearance - well kempt   ENMT - no sinus tenderness, no oral exudate, no LAN, Mallampati 4 airway, no stridor, 3+ tonsils, elongated uvula  Respiratory - equal breath sounds bilaterally, no wheezing or rales  CV - s1s2 regular rate and rhythm, no murmurs  Ext - no clubbing, no edema  Skin - no rashes  Psych - normal mood and affect   Pulmonary testing:   PFT 08/18/18 >> FEV1 2.41 (84%), FEV1% 88, TLC 4.27 (87%), DLCO 66%  Chest Imaging:    Sleep Tests:   PSG 08/28/18 >>  AHI 16.6, SpO2 low 82%; CPAP 11 cm H2O  Social History:  She  reports that she has been smoking cigarettes. She has a 64.00 pack-year smoking history. She has never used smokeless tobacco. She reports that she does not drink alcohol and does not use drugs.  Family History:  Her family history includes Alcohol abuse in her father and paternal uncle; Diabetes in her father; Hyperlipidemia in her father; Schizophrenia in her father.     Assessment/Plan:   Chronic cough. - likely combination of continued tobacco abuse, possible COPD, post nasal drip and acid reflux - will arrange for chest xray and pulmonary function test  Possible COPD.  - continue trelegy for now  Upper airway cough with allergic rhinitis and post nasal drip. - continue dymista and xyzal - singulair was ineffective  Laryngopharyngeal reflux. - remains on nexium through her PCP  Obstructive sleep apnea. - she reports compliance with CPAP and benefit from therapy - she reports her DME is [facility] - previous sleep study showed she did well with CPAP 11 cm H2O - will get a copy of her CPAP download and call her with results  Tobacco abuse. - advised her to d/w behavioral health providers whether she could be tried on chantix or bupropion - advised her to try gradually reducing  how much she smokes, and that trying nicotine replacement again could help  Obesity. - discussed importance of weight loss  Time Spent Involved in Patient Care on Day of Examination:  33 minutes  Follow up:  Patient Instructions  Will arrange for chest xray and pulmonary function test  Will get copy of CPAP report from [facility]  Follow up in 6 weeks   Medication List:   Allergies as of 01/22/2021   No Known Allergies     Medication List       Accurate as of January 22, 2021 11:57 AM. If you have any questions, ask your nurse or doctor.        STOP taking these medications   Allzital 25-325 MG Tabs Generic drug:  Butalbital-Acetaminophen Stopped by: Coralyn Helling, MD   B-D ULTRAFINE III SHORT PEN 31G X 8 MM Misc Generic drug: Insulin Pen Needle Stopped by: Coralyn Helling, MD   Movantik 25 MG Tabs tablet Generic drug: naloxegol oxalate Stopped by: Coralyn Helling, MD     TAKE these medications   Accu-Chek Aviva Plus test strip Generic drug: glucose blood SMARTSIG:Via Meter   Accu-Chek Softclix Lancets lancets SMARTSIG:Topical   ALPRAZolam 1 MG tablet Commonly known as: XANAX Take 1 tablet (1 mg total) by mouth 3 (three) times daily as needed for anxiety.   Atrovent HFA 17 MCG/ACT inhaler Generic drug: ipratropium Inhale 2 puffs into the lungs every 6 (six) hours as needed for wheezing.   Azelastine-Fluticasone 137-50 MCG/ACT Susp Place 1 spray into the nose in the morning and at bedtime. One spray each nostril two times daily   cariprazine capsule Commonly known as: Vraylar Take 1 capsule (3 mg total) by mouth daily.   DULoxetine 60 MG capsule Commonly known as: Cymbalta Take 1 capsule (60 mg total) by mouth 2 (two) times daily.   esomeprazole 20 MG capsule Commonly known as: NEXIUM Take 20 mg by mouth daily at 12 noon.   fenofibrate 145 MG tablet Commonly known as: TRICOR Take 1 tablet (145 mg total) by mouth daily.   gabapentin 800 MG tablet Commonly known as: NEURONTIN Take by mouth.   glipiZIDE 5 MG tablet Commonly known as: GLUCOTROL Take 2 tablets (10 mg total) by mouth 2 (two) times daily before a meal.   HYDROcodone-acetaminophen 10-325 MG tablet Commonly known as: NORCO hydrocodone 10 mg-acetaminophen 325 mg tablet  Take 1 tablet every 4 hours by oral route.   levocetirizine 5 MG tablet Commonly known as: XYZAL Take 5 mg by mouth at bedtime.   levothyroxine 75 MCG tablet Commonly known as: SYNTHROID Take 75 mcg by mouth daily.   lisdexamfetamine 70 MG capsule Commonly known as: Vyvanse Take 1 capsule (70 mg total) by mouth daily.   methocarbamol 500 MG  tablet Commonly known as: ROBAXIN Take 500 mg by mouth every 6 (six) hours as needed for muscle spasms.   nabumetone 500 MG tablet Commonly known as: RELAFEN Take 500 mg by mouth 2 (two) times daily.   pioglitazone 30 MG tablet Commonly known as: ACTOS Take 30 mg by mouth daily.   sitaGLIPtin 100 MG tablet Commonly known as: Januvia Take 1 tablet (100 mg total) by mouth daily.   Trelegy Ellipta 100-62.5-25 MCG/INH Aepb Generic drug: Fluticasone-Umeclidin-Vilant Inhale 1 puff into the lungs daily in the afternoon.   Vitamin D (Ergocalciferol) 1.25 MG (50000 UNIT) Caps capsule Commonly known as: DRISDOL Take by mouth.       Signature:  Coralyn Helling, MD [facility]  Pager - [phone] 01/22/2021, 11:57 AM

## 2021-01-22 NOTE — Patient Instructions (Signed)
Will arrange for chest xray and pulmonary function test  Will get copy of CPAP report from [facility]  Follow up in 6 weeks

## 2021-01-24 ENCOUNTER — Encounter: Payer: Self-pay | Admitting: Nurse Practitioner

## 2021-01-24 ENCOUNTER — Other Ambulatory Visit: Payer: Self-pay

## 2021-01-24 ENCOUNTER — Ambulatory Visit (INDEPENDENT_AMBULATORY_CARE_PROVIDER_SITE_OTHER): Payer: Medicaid Other | Admitting: Nurse Practitioner

## 2021-01-24 VITALS — BP 146/90 | HR 97 | Ht 63.5 in | Wt 222.2 lb

## 2021-01-24 DIAGNOSIS — E119 Type 2 diabetes mellitus without complications: Secondary | ICD-10-CM | POA: Diagnosis not present

## 2021-01-24 DIAGNOSIS — F17200 Nicotine dependence, unspecified, uncomplicated: Secondary | ICD-10-CM | POA: Diagnosis not present

## 2021-01-24 DIAGNOSIS — E782 Mixed hyperlipidemia: Secondary | ICD-10-CM | POA: Diagnosis not present

## 2021-01-24 LAB — POCT UA - MICROALBUMIN
Creatinine, POC: 50 mg/dL
Microalbumin Ur, POC: 10 mg/L

## 2021-01-24 LAB — POCT GLYCOSYLATED HEMOGLOBIN (HGB A1C): HbA1c, POC (controlled diabetic range): 6.8 % (ref 0.0–7.0)

## 2021-01-24 MED ORDER — SITAGLIPTIN PHOSPHATE 100 MG PO TABS: 100.0000 mg | ORAL_TABLET | Freq: Every day | ORAL | 3 refills | Status: DC

## 2021-01-24 MED ORDER — PIOGLITAZONE HCL 30 MG PO TABS: 30.0000 mg | ORAL_TABLET | Freq: Every day | ORAL | 3 refills | Status: DC

## 2021-01-24 MED ORDER — GLIPIZIDE 5 MG PO TABS: 10.0000 mg | ORAL_TABLET | Freq: Two times a day (BID) | ORAL | 3 refills | Status: DC

## 2021-01-24 NOTE — Patient Instructions (Signed)

## 2021-01-24 NOTE — Progress Notes (Signed)
01/24/2021, 10:26 AM Endocrinology Follow Up Visit   Subjective:    Patient ID: Catherine Howard, female    DOB: June 16, 1976.  Catherine Howard is being seen in follow up for management of currently uncontrolled symptomatic diabetes requested by  Smith RobertKikel, Stephen, MD.   Past Medical History:  Diagnosis Date  . ADHD   . Bipolar I disorder (HCC)   . Elevated cholesterol   . GERD (gastroesophageal reflux disease)   . Thyroid disease   . Type 2 diabetes mellitus (HCC)   . Vitamin D deficiency     Past Surgical History:  Procedure Laterality Date  . CESAREAN SECTION    . DILATION AND CURETTAGE OF UTERUS      Social History   Socioeconomic History  . Marital status: Single    Spouse name: Not on file  . Number of children: Not on file  . Years of education: Not on file  . Highest education level: Not on file  Occupational History  . Not on file  Tobacco Use  . Smoking status: Current Every Day Smoker    Packs/day: 2.00    Years: 32.00    Pack years: 64.00    Types: Cigarettes  . Smokeless tobacco: Never Used  . Tobacco comment: smokes 3/4 of a pack a day--01/22/2021  Substance and Sexual Activity  . Alcohol use: No    Alcohol/week: 0.0 standard drinks  . Drug use: No  . Sexual activity: Yes    Partners: Male    Birth control/protection: None  Other Topics Concern  . Not on file  Social History Narrative  . Not on file   Social Determinants of Health   Financial Resource Strain: Not on file  Food Insecurity: Not on file  Transportation Needs: Not on file  Physical Activity: Not on file  Stress: Not on file  Social Connections: Not on file    Family History  Problem Relation Age of Onset  . Schizophrenia Father   . Alcohol abuse Father   . Diabetes Father   . Hyperlipidemia Father   . Alcohol abuse Paternal Uncle     Outpatient Encounter Medications as of 01/24/2021   Medication Sig  . ACCU-CHEK AVIVA PLUS test strip SMARTSIG:Via Meter  . Accu-Chek Softclix Lancets lancets SMARTSIG:Topical  . ALPRAZolam (XANAX) 1 MG tablet Take 1 tablet (1 mg total) by mouth 3 (three) times daily as needed for anxiety.  . Azelastine-Fluticasone 137-50 MCG/ACT SUSP Place 1 spray into the nose in the morning and at bedtime. One spray each nostril two times daily  . butalbital-acetaminophen-caffeine (FIORICET) 50-325-40 MG tablet butalbital-acetaminophen-caffeine 50 mg-325 mg-40 mg tablet  . cariprazine (VRAYLAR) capsule Take 1 capsule (3 mg total) by mouth daily.  . DULoxetine (CYMBALTA) 60 MG capsule Take 1 capsule (60 mg total) by mouth 2 (two) times daily.  Marland Kitchen. esomeprazole (NEXIUM) 20 MG capsule Take 20 mg by mouth daily at 12 noon.  . fenofibrate (TRICOR) 145 MG tablet Take 1 tablet (145 mg total) by mouth daily.  . Fluticasone-Umeclidin-Vilant (TRELEGY ELLIPTA) 100-62.5-25 MCG/INH AEPB Inhale 1 puff into the lungs daily in  the afternoon.  . gabapentin (NEURONTIN) 800 MG tablet Take 800 mg by mouth 2 (two) times daily.  Marland Kitchen HYDROcodone-acetaminophen (NORCO) 10-325 MG tablet hydrocodone 10 mg-acetaminophen 325 mg tablet  Take 1 tablet every 4 hours by oral route.  Marland Kitchen ipratropium (ATROVENT HFA) 17 MCG/ACT inhaler Inhale 2 puffs into the lungs every 6 (six) hours as needed for wheezing.  Marland Kitchen levocetirizine (XYZAL) 5 MG tablet Take 5 mg by mouth at bedtime.  Marland Kitchen levothyroxine (SYNTHROID) 75 MCG tablet Take 75 mcg by mouth daily.  Marland Kitchen lisdexamfetamine (VYVANSE) 70 MG capsule Take 1 capsule (70 mg total) by mouth daily.  . nabumetone (RELAFEN) 500 MG tablet Take 500 mg by mouth 2 (two) times daily.  . Vitamin D, Ergocalciferol, (DRISDOL) 1.25 MG (50000 UNIT) CAPS capsule Take by mouth.  . [DISCONTINUED] glipiZIDE (GLUCOTROL) 5 MG tablet Take 2 tablets (10 mg total) by mouth 2 (two) times daily before a meal.  . [DISCONTINUED] pioglitazone (ACTOS) 30 MG tablet Take 30 mg by mouth daily.   . [DISCONTINUED] sitaGLIPtin (JANUVIA) 100 MG tablet Take 1 tablet (100 mg total) by mouth daily.  Marland Kitchen glipiZIDE (GLUCOTROL) 5 MG tablet Take 2 tablets (10 mg total) by mouth 2 (two) times daily before a meal.  . pioglitazone (ACTOS) 30 MG tablet Take 1 tablet (30 mg total) by mouth daily.  . sitaGLIPtin (JANUVIA) 100 MG tablet Take 1 tablet (100 mg total) by mouth daily.  . [DISCONTINUED] methocarbamol (ROBAXIN) 500 MG tablet Take 500 mg by mouth every 6 (six) hours as needed for muscle spasms.   No facility-administered encounter medications on file as of 01/24/2021.    ALLERGIES: No Known Allergies  VACCINATION STATUS: Immunization History  Administered Date(s) Administered  . Tdap 01/28/2017    Diabetes She presents for her follow-up diabetic visit. She has type 2 diabetes mellitus. Onset time: She was diagnosed at approximate age of 21 years. Her disease course has been improving. Pertinent negatives for hypoglycemia include no confusion, headaches, nervousness/anxiousness, pallor, seizures or tremors. Associated symptoms include fatigue and polyuria. Pertinent negatives for diabetes include no chest pain, no foot ulcerations, no polydipsia, no polyphagia, no visual change and no weight loss. There are no hypoglycemic complications. Symptoms are improving. There are no diabetic complications. Risk factors for coronary artery disease include diabetes mellitus, dyslipidemia, obesity, sedentary lifestyle, tobacco exposure and hypertension. Current diabetic treatment includes oral agent (triple therapy). She is compliant with treatment most of the time. Her weight is stable. She is following a generally unhealthy diet. When asked about meal planning, she reported none. She has not had a previous visit with a dietitian (She declined referral to a dietitian.). She rarely participates in exercise. Her home blood glucose trend is fluctuating minimally. Her breakfast blood glucose range is generally  180-200 mg/dl. Her bedtime blood glucose range is generally 140-180 mg/dl. (She presents today with her logs, no meter, showing slightly above target fasting and at target postprandial glycemic profile.  Her POCT A1c today is 6.8%, essentially unchanged from previous visit.  She denies any s/s of hypoglycemia, none recorded on her logs either.) An ACE inhibitor/angiotensin II receptor blocker is not being taken. She does not see a podiatrist.Eye exam is current.  Hyperlipidemia This is a chronic problem. The current episode started more than 1 year ago. The problem is uncontrolled (yet improving). Recent lipid tests were reviewed and are high. Exacerbating diseases include diabetes, hypothyroidism and obesity. Factors aggravating her hyperlipidemia include fatty foods. Pertinent negatives include no chest  pain, myalgias or shortness of breath. Current antihyperlipidemic treatment includes statins and fibric acid derivatives. The current treatment provides moderate improvement of lipids. Compliance problems include adherence to diet and adherence to exercise.  Risk factors for coronary artery disease include diabetes mellitus, dyslipidemia, a sedentary lifestyle, obesity and hypertension.     Review of systems  Constitutional: + steadily increasing body weight,  current Body mass index is 38.74 kg/m. , no fatigue, no subjective hyperthermia, no subjective hypothermia Eyes: no blurry vision, no xerophthalmia ENT: no sore throat, no nodules palpated in throat, no dysphagia/odynophagia, no hoarseness Cardiovascular: no chest pain, no shortness of breath, no palpitations, no leg swelling Respiratory: no cough, no shortness of breath Gastrointestinal: no nausea/vomiting/diarrhea Musculoskeletal: no muscle/joint aches, complains of pain in arch of right foot (had previously broken this foot) Skin: no rashes, no hyperemia Neurological: no tremors, no numbness, no tingling, no dizziness Psychiatric: no  depression, no anxiety, history of bipolar disorder- controlled  Objective:     BP (!) 146/90 (BP Location: Left Arm)   Pulse 97   Ht 5' 3.5" (1.613 m)   Wt 222 lb 3.2 oz (100.8 kg)   BMI 38.74 kg/m   Wt Readings from Last 3 Encounters:  01/24/21 222 lb 3.2 oz (100.8 kg)  01/22/21 220 lb 9.6 oz (100.1 kg)  10/23/20 220 lb (99.8 kg)    BP Readings from Last 3 Encounters:  01/24/21 (!) 146/90  01/22/21 124/78  10/23/20 136/84    Physical Exam- Limited  Constitutional:  Body mass index is 38.74 kg/m. , not in acute distress, normal state of mind Eyes:  EOMI, no exophthalmos Neck: Supple Thyroid: No gross goiter Cardiovascular: RRR, no murmers, rubs, or gallops,  Respiratory: Adequate breathing efforts, no crackles, rales, rhonchi, or wheezing Musculoskeletal: no gross deformities, strength intact in all four extremities, no gross restriction of joint movements Skin:  no rashes, no hyperemia Neurological: no tremor with outstretched hands   Foot exam:   No rashes, ulcers, cuts, + calluses, onychodystrophy.   Good pulses bilat.  Good sensation to 10 g monofilament bilat.   POCT ABI Results 01/24/21   Right ABI:  1.07      Left ABI:  1.07  Right leg systolic / diastolic: 156/87 mmHg Left leg systolic / diastolic: 156/78 mmHg  Arm systolic / diastolic: 146/90 mmHG  Detailed report will be scanned into patient chart.    CMP ( most recent) CMP     Component Value Date/Time   NA 138 10/10/2020 0000   K 4.4 10/10/2020 0000   CL 104 10/10/2020 0000   CO2 26 (A) 10/10/2020 0000   GLUCOSE 89 07/14/2014 0958   BUN 11 10/10/2020 0000   CREATININE 0.9 10/10/2020 0000   CREATININE 0.75 07/14/2014 0958   CALCIUM 9.8 10/10/2020 0000   ALBUMIN 4.2 10/10/2020 0000   AST 32 10/10/2020 0000   ALT 27 10/10/2020 0000   ALKPHOS 104 10/10/2020 0000   GFRNONAA 80 10/10/2020 0000     Diabetic Labs (most recent): Lab Results  Component Value Date   HGBA1C 6.8  01/24/2021   HGBA1C 6.7 10/10/2020   HGBA1C 7.2 (A) 05/08/2020     Lipid Panel     Component Value Date/Time   CHOL 179 10/10/2020 0000   TRIG 278 (A) 10/10/2020 0000   HDL 46 10/10/2020 0000   LDLCALC 93 10/10/2020 0000      Assessment & Plan:   1) Type 2 diabetes mellitus without complication, without long-term current  use of insulin (HCC)  - Catherine Howard has currently uncontrolled symptomatic type 2 DM since  45 years of age.  She presents today with her logs, no meter, showing slightly above target fasting and at target postprandial glycemic profile.  Her POCT A1c today is 6.8%, essentially unchanged from previous visit.  She denies any s/s of hypoglycemia, none recorded on her logs either.  - I had a long discussion with her about the progressive nature of diabetes and the pathology behind its complications. -her diabetes is complicated by obesity/sedentary life, chronic  Smoking, sleep apnea and she remains at a high risk for more acute and chronic complications which include CAD, CVA, CKD, retinopathy, and neuropathy. These are all discussed in detail with her.  - Nutritional counseling repeated at each appointment due to patients tendency to fall back in to old habits.  - The patient admits there is a room for improvement in their diet and drink choices. -  Suggestion is made for the patient to avoid simple carbohydrates from their diet including Cakes, Sweet Desserts / Pastries, Ice Cream, Soda (diet and regular), Sweet Tea, Candies, Chips, Cookies, Sweet Pastries,  Store Bought Juices, Alcohol in Excess of  1-2 drinks a day, Artificial Sweeteners, Coffee Creamer, and "Sugar-free" Products. This will help patient to have stable blood glucose profile and potentially avoid unintended weight gain.   - I encouraged the patient to switch to unprocessed or minimally processed complex starch and increased protein intake (animal or plant source), fruits, and vegetables.   -  Patient is advised to stick to a routine mealtimes to eat 3 meals  a day and avoid unnecessary snacks ( to snack only to correct hypoglycemia).  - she declined referral to CDE.    - I have approached her with the following individualized plan to manage  her diabetes and patient agrees:   - Given her controlled glycemic profile, she is advised to continue Glipizide 10 mg po twice daily with meals and continue Actos 30 mg po daily.  She can continue Januvia 100 mg po daily.  -She is advised to continue monitoring glucose at least twice daily, before breakfast and before bed, and call the clinic if she has readings less than 70 or greater than 300 for 3 tests in a row.  - Specific targets for  A1c;  LDL, HDL,  and Triglycerides were discussed with the patient.  2) Blood Pressure /Hypertension: Her blood pressure is not controlled to target today..  She is not currently on any antihypertensive medications at this time.  If BP is elevated on 3 separate occasions, she will be considered for low dose ACE/ARB for renal protection from DM.  3) Lipids/Hyperlipidemia:  Her most recent lipid panel from 10/10/20 shows controlled LDL of 93 and improving triglycerides of 278.  She is advised to continue Crestor 20 mg po daily at bedtime and Fenofibrate 145 mg po daily.  Side effects and precautions discussed with her.  4)  Weight/Diet:  Her Body mass index is 38.74 kg/m.-   clearly complicating her diabetes care.   she is  a candidate for weight loss. I discussed with her the fact that loss of 5 - 10% of her  current body weight will have the most impact on her diabetes management.  Exercise, and detailed carbohydrates information provided  -  detailed on discharge instructions.  5) Chronic Care/Health Maintenance: -she is on Statin medications and  is encouraged to initiate and continue to follow up  with Ophthalmology, Dentist, Podiatrist at least yearly or according to recommendations, and advised to  Quit  smoking. I have recommended yearly flu vaccine and pneumonia vaccine at least every 5 years; moderate intensity exercise for up to 150 minutes weekly; and  sleep for at least 7 hours a day.  Smoking cessation instruction/counseling given:  counseled patient on the dangers of tobacco use, advised patient to stop smoking, and reviewed strategies to maximize success  - she is  advised to maintain close follow up with Smith Robert, MD for primary care needs, as well as her other providers for optimal and coordinated care.  - Time spent on this patient care encounter:  35 min, of which > 50% was spent in  counseling and the rest reviewing her blood glucose logs , discussing her hypoglycemia and hyperglycemia episodes, reviewing her current and  previous labs / studies  ( including abstraction from other facilities) and medications  doses and developing a  long term treatment plan and documenting her care.   Please refer to Patient Instructions for Blood Glucose Monitoring and Insulin/Medications Dosing Guide"  in media tab for additional information. Please  also refer to " Patient Self Inventory" in the Media  tab for reviewed elements of pertinent patient history.  Catherine Howard participated in the discussions, expressed understanding, and voiced agreement with the above plans.  All questions were answered to her satisfaction. she is encouraged to contact clinic should she have any questions or concerns prior to her return visit.    Follow up plan: - Return in about 4 months (around 05/24/2021) for Diabetes follow up with A1c in office, Previsit labs, Bring glucometer and logs.  Ronny Bacon, [facility] [address] [place], [place] [address] Phone: [phone] Fax: [phone]  01/24/2021, 10:26 AM

## 2021-01-26 ENCOUNTER — Ambulatory Visit (HOSPITAL_COMMUNITY): Payer: Medicaid Other

## 2021-02-17 NOTE — Progress Notes (Deleted)
Referring Provider: Smith Robert, MD Primary Care Physician:  Smith Robert, MD Primary Gastroenterologist:  Dr. Marletta Lor  No chief complaint on file.   HPI:   Catherine Howard is a 45 y.o. female presenting today at the request of Smith Robert, MD for elevated LFTs.   Past Medical History:  Diagnosis Date  . ADHD   . Bipolar I disorder (HCC)   . Elevated cholesterol   . GERD (gastroesophageal reflux disease)   . Thyroid disease   . Type 2 diabetes mellitus (HCC)   . Vitamin D deficiency     Past Surgical History:  Procedure Laterality Date  . CESAREAN SECTION    . DILATION AND CURETTAGE OF UTERUS      Current Outpatient Medications  Medication Sig Dispense Refill  . ACCU-CHEK AVIVA PLUS test strip SMARTSIG:Via Meter    . Accu-Chek Softclix Lancets lancets SMARTSIG:Topical    . ALPRAZolam (XANAX) 1 MG tablet Take 1 tablet (1 mg total) by mouth 3 (three) times daily as needed for anxiety. 90 tablet 4  . Azelastine-Fluticasone 137-50 MCG/ACT SUSP Place 1 spray into the nose in the morning and at bedtime. One spray each nostril two times daily    . butalbital-acetaminophen-caffeine (FIORICET) 50-325-40 MG tablet butalbital-acetaminophen-caffeine 50 mg-325 mg-40 mg tablet    . cariprazine (VRAYLAR) capsule Take 1 capsule (3 mg total) by mouth daily. 30 capsule 2  . DULoxetine (CYMBALTA) 60 MG capsule Take 1 capsule (60 mg total) by mouth 2 (two) times daily. 180 capsule 2  . esomeprazole (NEXIUM) 20 MG capsule Take 20 mg by mouth daily at 12 noon.    . fenofibrate (TRICOR) 145 MG tablet Take 1 tablet (145 mg total) by mouth daily. 90 tablet 3  . Fluticasone-Umeclidin-Vilant (TRELEGY ELLIPTA) 100-62.5-25 MCG/INH AEPB Inhale 1 puff into the lungs daily in the afternoon.    . gabapentin (NEURONTIN) 800 MG tablet Take 800 mg by mouth 2 (two) times daily.    Marland Kitchen glipiZIDE (GLUCOTROL) 5 MG tablet Take 2 tablets (10 mg total) by mouth 2 (two) times daily before a meal. 180 tablet 3   . HYDROcodone-acetaminophen (NORCO) 10-325 MG tablet hydrocodone 10 mg-acetaminophen 325 mg tablet  Take 1 tablet every 4 hours by oral route.    Marland Kitchen ipratropium (ATROVENT HFA) 17 MCG/ACT inhaler Inhale 2 puffs into the lungs every 6 (six) hours as needed for wheezing. 1 Inhaler 11  . levocetirizine (XYZAL) 5 MG tablet Take 5 mg by mouth at bedtime.    Marland Kitchen levothyroxine (SYNTHROID) 75 MCG tablet Take 75 mcg by mouth daily.    Marland Kitchen lisdexamfetamine (VYVANSE) 70 MG capsule Take 1 capsule (70 mg total) by mouth daily. 30 capsule 0  . nabumetone (RELAFEN) 500 MG tablet Take 500 mg by mouth 2 (two) times daily.    . pioglitazone (ACTOS) 30 MG tablet Take 1 tablet (30 mg total) by mouth daily. 90 tablet 3  . sitaGLIPtin (JANUVIA) 100 MG tablet Take 1 tablet (100 mg total) by mouth daily. 90 tablet 3  . Vitamin D, Ergocalciferol, (DRISDOL) 1.25 MG (50000 UNIT) CAPS capsule Take by mouth.     No current facility-administered medications for this visit.    Allergies as of 02/19/2021  . (No Known Allergies)    Family History  Problem Relation Age of Onset  . Schizophrenia Father   . Alcohol abuse Father   . Diabetes Father   . Hyperlipidemia Father   . Alcohol abuse Paternal Uncle     Social History  Socioeconomic History  . Marital status: Single    Spouse name: Not on file  . Number of children: Not on file  . Years of education: Not on file  . Highest education level: Not on file  Occupational History  . Not on file  Tobacco Use  . Smoking status: Current Every Day Smoker    Packs/day: 2.00    Years: 32.00    Pack years: 64.00    Types: Cigarettes  . Smokeless tobacco: Never Used  . Tobacco comment: smokes 3/4 of a pack a day--01/22/2021  Substance and Sexual Activity  . Alcohol use: No    Alcohol/week: 0.0 standard drinks  . Drug use: No  . Sexual activity: Yes    Partners: Male    Birth control/protection: None  Other Topics Concern  . Not on file  Social History Narrative   . Not on file   Social Determinants of Health   Financial Resource Strain: Not on file  Food Insecurity: Not on file  Transportation Needs: Not on file  Physical Activity: Not on file  Stress: Not on file  Social Connections: Not on file  Intimate Partner Violence: Not on file    Review of Systems: Gen: Denies any fever, chills, fatigue, weight loss, lack of appetite.  CV: Denies chest pain, heart palpitations, peripheral edema, syncope.  Resp: Denies shortness of breath at rest or with exertion. Denies wheezing or cough.  GI: Denies dysphagia or odynophagia. Denies jaundice, hematemesis, fecal incontinence. GU : Denies urinary burning, urinary frequency, urinary hesitancy MS: Denies joint pain, muscle weakness, cramps, or limitation of movement.  Derm: Denies rash, itching, dry skin Psych: Denies depression, anxiety, memory loss, and confusion Heme: Denies bruising, bleeding, and enlarged lymph nodes.  Physical Exam: There were no vitals taken for this visit. General:   Alert and oriented. Pleasant and cooperative. Well-nourished and well-developed.  Head:  Normocephalic and atraumatic. Eyes:  Without icterus, sclera clear and conjunctiva pink.  Ears:  Normal auditory acuity. Nose:  No deformity, discharge,  or lesions. Mouth:  No deformity or lesions, oral mucosa pink.  Neck:  Supple, without mass or thyromegaly. Lungs:  Clear to auscultation bilaterally. No wheezes, rales, or rhonchi. No distress.  Heart:  S1, S2 present without murmurs appreciated.  Abdomen:  +BS, soft, non-tender and non-distended. No HSM noted. No guarding or rebound. No masses appreciated.  Rectal:  Deferred  Msk:  Symmetrical without gross deformities. Normal posture. Pulses:  Normal pulses noted. Extremities:  Without clubbing or edema. Neurologic:  Alert and  oriented x4;  grossly normal neurologically. Skin:  Intact without significant lesions or rashes. Cervical Nodes:  No significant cervical  adenopathy. Psych:  Alert and cooperative. Normal mood and affect.

## 2021-02-19 ENCOUNTER — Ambulatory Visit: Payer: Medicaid Other | Admitting: Gastroenterology

## 2021-02-22 ENCOUNTER — Encounter: Payer: Self-pay | Admitting: Gastroenterology

## 2021-03-20 ENCOUNTER — Ambulatory Visit: Payer: Medicaid Other | Admitting: Pulmonary Disease

## 2021-03-23 ENCOUNTER — Ambulatory Visit: Payer: Medicaid Other | Admitting: Gastroenterology

## 2021-03-29 NOTE — Progress Notes (Deleted)
Referring Provider: Vidal Schwalbe, MD Primary Care Physician:  Vidal Schwalbe, MD Primary Gastroenterologist:  Dr. Abbey Chatters  No chief complaint on file.   HPI:   Catherine Howard is a 45 y.o. female presenting today at the request of Vidal Schwalbe, MD for elevated LFTs.  Reviewed labs in the system.  In July 2021, alk phos 144 (H), ALT 30 (H), AST 32.  In August 2021, alk phos 133 (H), ALT 37 (H), AST 33.  October 2021, alk phos 104, ALT 27, AST 32.  No abdominal imaging on file.    Past Medical History:  Diagnosis Date  . ADHD   . Bipolar I disorder ([place])   . Elevated cholesterol   . GERD (gastroesophageal reflux disease)   . Thyroid disease   . Type 2 diabetes mellitus ([place])   . Vitamin D deficiency     Past Surgical History:  Procedure Laterality Date  . CESAREAN SECTION    . DILATION AND CURETTAGE OF UTERUS      Current Outpatient Medications  Medication Sig Dispense Refill  . ACCU-CHEK AVIVA PLUS test strip SMARTSIG:Via Meter    . Accu-Chek Softclix Lancets lancets SMARTSIG:Topical    . ALPRAZolam (XANAX) 1 MG tablet Take 1 tablet (1 mg total) by mouth 3 (three) times daily as needed for anxiety. 90 tablet 4  . Azelastine-Fluticasone 137-50 MCG/ACT SUSP Place 1 spray into the nose in the morning and at bedtime. One spray each nostril two times daily    . butalbital-acetaminophen-caffeine (FIORICET) 50-325-40 MG tablet butalbital-acetaminophen-caffeine 50 mg-325 mg-40 mg tablet    . cariprazine (VRAYLAR) capsule Take 1 capsule (3 mg total) by mouth daily. 30 capsule 2  . DULoxetine (CYMBALTA) 60 MG capsule Take 1 capsule (60 mg total) by mouth 2 (two) times daily. 180 capsule 2  . esomeprazole (NEXIUM) 20 MG capsule Take 20 mg by mouth daily at 12 noon.    . fenofibrate (TRICOR) 145 MG tablet Take 1 tablet (145 mg total) by mouth daily. 90 tablet 3  . Fluticasone-Umeclidin-Vilant (TRELEGY ELLIPTA) 100-62.5-25 MCG/INH AEPB Inhale 1 puff into the lungs daily in the  afternoon.    . gabapentin (NEURONTIN) 800 MG tablet Take 800 mg by mouth 2 (two) times daily.    Marland Kitchen glipiZIDE (GLUCOTROL) 5 MG tablet Take 2 tablets (10 mg total) by mouth 2 (two) times daily before a meal. 180 tablet 3  . HYDROcodone-acetaminophen (NORCO) 10-325 MG tablet hydrocodone 10 mg-acetaminophen 325 mg tablet  Take 1 tablet every 4 hours by oral route.    Marland Kitchen ipratropium (ATROVENT HFA) 17 MCG/ACT inhaler Inhale 2 puffs into the lungs every 6 (six) hours as needed for wheezing. 1 Inhaler 11  . levocetirizine (XYZAL) 5 MG tablet Take 5 mg by mouth at bedtime.    Marland Kitchen levothyroxine (SYNTHROID) 75 MCG tablet Take 75 mcg by mouth daily.    Marland Kitchen lisdexamfetamine (VYVANSE) 70 MG capsule Take 1 capsule (70 mg total) by mouth daily. 30 capsule 0  . nabumetone (RELAFEN) 500 MG tablet Take 500 mg by mouth 2 (two) times daily.    . pioglitazone (ACTOS) 30 MG tablet Take 1 tablet (30 mg total) by mouth daily. 90 tablet 3  . sitaGLIPtin (JANUVIA) 100 MG tablet Take 1 tablet (100 mg total) by mouth daily. 90 tablet 3  . Vitamin D, Ergocalciferol, (DRISDOL) 1.25 MG (50000 UNIT) CAPS capsule Take by mouth.     No current facility-administered medications for this visit.    Allergies as of 03/30/2021  . (  No Known Allergies)    Family History  Problem Relation Age of Onset  . Schizophrenia Father   . Alcohol abuse Father   . Diabetes Father   . Hyperlipidemia Father   . Alcohol abuse Paternal Uncle     Social History   Socioeconomic History  . Marital status: Single    Spouse name: Not on file  . Number of children: Not on file  . Years of education: Not on file  . Highest education level: Not on file  Occupational History  . Not on file  Tobacco Use  . Smoking status: Current Every Day Smoker    Packs/day: 2.00    Years: 32.00    Pack years: 64.00    Types: Cigarettes  . Smokeless tobacco: Never Used  . Tobacco comment: smokes 3/4 of a pack a day--01/22/2021  Substance and Sexual  Activity  . Alcohol use: No    Alcohol/week: 0.0 standard drinks  . Drug use: No  . Sexual activity: Yes    Partners: Male    Birth control/protection: None  Other Topics Concern  . Not on file  Social History Narrative  . Not on file   Social Determinants of Health   Financial Resource Strain: Not on file  Food Insecurity: Not on file  Transportation Needs: Not on file  Physical Activity: Not on file  Stress: Not on file  Social Connections: Not on file  Intimate Partner Violence: Not on file    Review of Systems: Gen: Denies any fever, chills, fatigue, weight loss, lack of appetite.  CV: Denies chest pain, heart palpitations, peripheral edema, syncope.  Resp: Denies shortness of breath at rest or with exertion. Denies wheezing or cough.  GI: Denies dysphagia or odynophagia. Denies jaundice, hematemesis, fecal incontinence. GU : Denies urinary burning, urinary frequency, urinary hesitancy MS: Denies joint pain, muscle weakness, cramps, or limitation of movement.  Derm: Denies rash, itching, dry skin Psych: Denies depression, anxiety, memory loss, and confusion Heme: Denies bruising, bleeding, and enlarged lymph nodes.  Physical Exam: There were no vitals taken for this visit. General:   Alert and oriented. Pleasant and cooperative. Well-nourished and well-developed.  Head:  Normocephalic and atraumatic. Eyes:  Without icterus, sclera clear and conjunctiva pink.  Ears:  Normal auditory acuity. Nose:  No deformity, discharge,  or lesions. Mouth:  No deformity or lesions, oral mucosa pink.  Neck:  Supple, without mass or thyromegaly. Lungs:  Clear to auscultation bilaterally. No wheezes, rales, or rhonchi. No distress.  Heart:  S1, S2 present without murmurs appreciated.  Abdomen:  +BS, soft, non-tender and non-distended. No HSM noted. No guarding or rebound. No masses appreciated.  Rectal:  Deferred  Msk:  Symmetrical without gross deformities. Normal posture. Pulses:   Normal pulses noted. Extremities:  Without clubbing or edema. Neurologic:  Alert and  oriented x4;  grossly normal neurologically. Skin:  Intact without significant lesions or rashes. Cervical Nodes:  No significant cervical adenopathy. Psych:  Alert and cooperative. Normal mood and affect.

## 2021-03-30 ENCOUNTER — Other Ambulatory Visit (HOSPITAL_COMMUNITY): Payer: Self-pay | Admitting: Psychiatry

## 2021-03-30 ENCOUNTER — Ambulatory Visit: Payer: Medicaid Other | Admitting: Gastroenterology

## 2021-03-30 ENCOUNTER — Encounter: Payer: Self-pay | Admitting: Internal Medicine

## 2021-04-02 ENCOUNTER — Telehealth (INDEPENDENT_AMBULATORY_CARE_PROVIDER_SITE_OTHER): Payer: No Typology Code available for payment source | Admitting: Psychiatry

## 2021-04-02 ENCOUNTER — Other Ambulatory Visit: Payer: Self-pay

## 2021-04-02 ENCOUNTER — Encounter (HOSPITAL_COMMUNITY): Payer: Self-pay | Admitting: Psychiatry

## 2021-04-02 DIAGNOSIS — F902 Attention-deficit hyperactivity disorder, combined type: Secondary | ICD-10-CM

## 2021-04-02 DIAGNOSIS — F3162 Bipolar disorder, current episode mixed, moderate: Secondary | ICD-10-CM | POA: Diagnosis not present

## 2021-04-02 MED ORDER — ALPRAZOLAM 1 MG PO TABS: 1.0000 mg | ORAL_TABLET | Freq: Three times a day (TID) | ORAL | 4 refills | Status: DC | PRN

## 2021-04-02 MED ORDER — LISDEXAMFETAMINE DIMESYLATE 70 MG PO CAPS: 70.0000 mg | ORAL_CAPSULE | Freq: Every morning | ORAL | 0 refills | Status: DC

## 2021-04-02 MED ORDER — DULOXETINE HCL 60 MG PO CPEP: 60.0000 mg | ORAL_CAPSULE | Freq: Two times a day (BID) | ORAL | 2 refills | Status: DC

## 2021-04-02 MED ORDER — LISDEXAMFETAMINE DIMESYLATE 70 MG PO CAPS: 70.0000 mg | ORAL_CAPSULE | Freq: Every day | ORAL | 0 refills | Status: DC

## 2021-04-02 MED ORDER — CARIPRAZINE HCL 3 MG PO CAPS: ORAL_CAPSULE | ORAL | 3 refills | Status: DC
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
## 2021-04-05 ENCOUNTER — Encounter (HOSPITAL_COMMUNITY): Payer: Self-pay

## 2021-04-11 ENCOUNTER — Encounter (INDEPENDENT_AMBULATORY_CARE_PROVIDER_SITE_OTHER): Payer: Self-pay | Admitting: *Deleted

## 2021-04-23 ENCOUNTER — Other Ambulatory Visit: Payer: Self-pay | Admitting: Nurse Practitioner

## 2021-05-07 LAB — TSH: TSH: 2.48 (ref 0.41–5.90)

## 2021-05-24 ENCOUNTER — Ambulatory Visit: Payer: Medicaid Other | Admitting: Nurse Practitioner

## 2021-05-26 LAB — COMPREHENSIVE METABOLIC PANEL
ALT: 27 IU/L (ref 0–32)
AST: 30 IU/L (ref 0–40)
Albumin/Globulin Ratio: 2.1 (ref 1.2–2.2)
Albumin: 4.8 g/dL (ref 3.8–4.8)
Alkaline Phosphatase: 104 IU/L (ref 44–121)
BUN/Creatinine Ratio: 9 (ref 9–23)
BUN: 8 mg/dL (ref 6–24)
Bilirubin Total: <0.2 mg/dL (ref 0.0–1.2)
CO2: 24 mmol/L (ref 20–29)
Calcium: 10.1 mg/dL (ref 8.7–10.2)
Chloride: 102 mmol/L (ref 96–106)
Creatinine, Ser: 0.86 mg/dL (ref 0.57–1.00)
Globulin, Total: 2.3 g/dL (ref 1.5–4.5)
Glucose: 76 mg/dL (ref 65–99)
Potassium: 4.8 mmol/L (ref 3.5–5.2)
Sodium: 140 mmol/L (ref 134–144)
Total Protein: 7.1 g/dL (ref 6.0–8.5)
eGFR: 84 mL/min/{1.73_m2} (ref >59)

## 2021-05-28 ENCOUNTER — Telehealth: Payer: Self-pay | Admitting: Nurse Practitioner

## 2021-05-28 NOTE — Telephone Encounter (Signed)
FYI Pt called to let us know that at her next visit. 06/01/21. She wants to discuss weight loss medications.

## 2021-05-28 NOTE — Telephone Encounter (Signed)
Pt is calling and requesting a call back from the nurse in regards to 2 weight loss pills. [phone]

## 2021-05-28 NOTE — Telephone Encounter (Signed)
noted 

## 2021-06-01 ENCOUNTER — Other Ambulatory Visit: Payer: Self-pay

## 2021-06-01 ENCOUNTER — Encounter: Payer: Medicaid Other | Admitting: Nurse Practitioner

## 2021-06-01 VITALS — Ht 63.5 in

## 2021-06-01 NOTE — Patient Instructions (Signed)

## 2021-06-05 ENCOUNTER — Telehealth (HOSPITAL_COMMUNITY): Payer: Self-pay | Admitting: *Deleted

## 2021-06-05 ENCOUNTER — Other Ambulatory Visit: Payer: Self-pay | Admitting: "Endocrinology

## 2021-06-05 NOTE — Telephone Encounter (Signed)
Patient is needing refills for all of her medications provider prescribes for her. Patient appt was cancelled due to provider being on medical leave. Message was left to call office to resch appt.  

## 2021-06-07 ENCOUNTER — Other Ambulatory Visit (HOSPITAL_COMMUNITY): Payer: Self-pay | Admitting: Psychiatry

## 2021-06-07 MED ORDER — LISDEXAMFETAMINE DIMESYLATE 70 MG PO CAPS: 70.0000 mg | ORAL_CAPSULE | Freq: Every day | ORAL | 0 refills | Status: DC

## 2021-06-07 MED ORDER — DULOXETINE HCL 60 MG PO CPEP: 60.0000 mg | ORAL_CAPSULE | Freq: Two times a day (BID) | ORAL | 2 refills | Status: DC

## 2021-06-07 MED ORDER — ALPRAZOLAM 1 MG PO TABS: 1.0000 mg | ORAL_TABLET | Freq: Three times a day (TID) | ORAL | 4 refills | Status: DC | PRN

## 2021-06-07 MED ORDER — CARIPRAZINE HCL 3 MG PO CAPS: ORAL_CAPSULE | ORAL | 3 refills | Status: DC

## 2021-06-07 NOTE — Telephone Encounter (Signed)
sent 

## 2021-06-19 ENCOUNTER — Encounter: Payer: Self-pay | Admitting: Nurse Practitioner

## 2021-06-19 ENCOUNTER — Other Ambulatory Visit: Payer: Self-pay

## 2021-06-19 ENCOUNTER — Ambulatory Visit (INDEPENDENT_AMBULATORY_CARE_PROVIDER_SITE_OTHER): Payer: Medicaid Other | Admitting: Nurse Practitioner

## 2021-06-19 VITALS — BP 141/95 | HR 101 | Ht 63.5 in | Wt 227.0 lb

## 2021-06-19 DIAGNOSIS — F17200 Nicotine dependence, unspecified, uncomplicated: Secondary | ICD-10-CM | POA: Diagnosis not present

## 2021-06-19 DIAGNOSIS — E782 Mixed hyperlipidemia: Secondary | ICD-10-CM

## 2021-06-19 DIAGNOSIS — E119 Type 2 diabetes mellitus without complications: Secondary | ICD-10-CM | POA: Diagnosis not present

## 2021-06-19 LAB — POCT GLYCOSYLATED HEMOGLOBIN (HGB A1C): Hemoglobin A1C: 6.7 % — AB (ref 4.0–5.6)

## 2021-06-19 MED ORDER — SITAGLIPTIN PHOSPHATE 100 MG PO TABS: 100.0000 mg | ORAL_TABLET | Freq: Every day | ORAL | 3 refills | Status: DC

## 2021-06-19 MED ORDER — PIOGLITAZONE HCL 30 MG PO TABS: 30.0000 mg | ORAL_TABLET | Freq: Every day | ORAL | 3 refills | Status: DC

## 2021-06-19 MED ORDER — LEVOTHYROXINE SODIUM 75 MCG PO TABS: 75.0000 ug | ORAL_TABLET | Freq: Every day | ORAL | 3 refills | Status: DC

## 2021-06-19 MED ORDER — GLIPIZIDE 5 MG PO TABS: 10.0000 mg | ORAL_TABLET | Freq: Two times a day (BID) | ORAL | 3 refills | Status: DC

## 2021-06-19 NOTE — Progress Notes (Signed)
06/19/2021, 9:32 AM Endocrinology Follow Up Visit   Subjective:    Patient ID: Catherine Howard, female    DOB: [birth year].  Catherine Howard is being seen in follow up for management of currently uncontrolled symptomatic diabetes requested by  Smith Robert, MD.   Past Medical History:  Diagnosis Date   ADHD    Bipolar I disorder (HCC)    Elevated cholesterol    GERD (gastroesophageal reflux disease)    Thyroid disease    Type 2 diabetes mellitus (HCC)    Vitamin D deficiency     Past Surgical History:  Procedure Laterality Date   CESAREAN SECTION     DILATION AND CURETTAGE OF UTERUS      Social History   Socioeconomic History   Marital status: Single    Spouse name: Not on file   Number of children: Not on file   Years of education: Not on file   Highest education level: Not on file  Occupational History   Not on file  Tobacco Use   Smoking status: Every Day    Packs/day: 2.00    Years: 32.00    Pack years: 64.00    Types: Cigarettes   Smokeless tobacco: Never   Tobacco comments:    smokes 3/4 of a pack a day--01/22/2021  Substance and Sexual Activity   Alcohol use: No    Alcohol/week: 0.0 standard drinks   Drug use: No   Sexual activity: Yes    Partners: Male    Birth control/protection: None  Other Topics Concern   Not on file  Social History Narrative   Not on file   Social Determinants of Health   Financial Resource Strain: Not on file  Food Insecurity: Not on file  Transportation Needs: Not on file  Physical Activity: Not on file  Stress: Not on file  Social Connections: Not on file    Family History  Problem Relation Age of Onset   Schizophrenia Father    Alcohol abuse Father    Diabetes Father    Hyperlipidemia Father    Alcohol abuse Paternal Uncle     Outpatient Encounter Medications as of 06/19/2021  Medication Sig   ACCU-CHEK AVIVA PLUS test  strip SMARTSIG:Via Meter   Accu-Chek Softclix Lancets lancets SMARTSIG:Topical   ALPRAZolam (XANAX) 1 MG tablet Take 1 tablet (1 mg total) by mouth 3 (three) times daily as needed for anxiety.   Azelastine-Fluticasone 137-50 MCG/ACT SUSP Place 1 spray into the nose in the morning and at bedtime. One spray each nostril two times daily   cariprazine (VRAYLAR) 3 MG capsule TAKE (1) CAPSULE BY MOUTH ONCE DAILY.   DULoxetine (CYMBALTA) 60 MG capsule Take 1 capsule (60 mg total) by mouth 2 (two) times daily.   esomeprazole (NEXIUM) 20 MG capsule Take 20 mg by mouth daily at 12 noon.   fenofibrate (TRICOR) 145 MG tablet TAKE 1 TABLET BY MOUTH ONCE DAILY.   Fluticasone-Umeclidin-Vilant (TRELEGY ELLIPTA) 100-62.5-25 MCG/INH AEPB Inhale 1 puff into the lungs daily in the afternoon.   gabapentin (NEURONTIN) 800 MG tablet Take 800 mg by mouth 2 (  two) times daily.   glipiZIDE (GLUCOTROL) 5 MG tablet Take 2 tablets (10 mg total) by mouth 2 (two) times daily before a meal.   ipratropium (ATROVENT HFA) 17 MCG/ACT inhaler Inhale 2 puffs into the lungs every 6 (six) hours as needed for wheezing.   levocetirizine (XYZAL) 5 MG tablet Take 5 mg by mouth at bedtime.   levothyroxine (SYNTHROID) 75 MCG tablet Take 1 tablet (75 mcg total) by mouth daily.   lisdexamfetamine (VYVANSE) 70 MG capsule Take 1 capsule (70 mg total) by mouth daily.   nabumetone (RELAFEN) 500 MG tablet Take 500 mg by mouth 2 (two) times daily.   pioglitazone (ACTOS) 30 MG tablet Take 1 tablet (30 mg total) by mouth daily.   sitaGLIPtin (JANUVIA) 100 MG tablet Take 1 tablet (100 mg total) by mouth daily.   Vitamin D, Ergocalciferol, (DRISDOL) 1.25 MG (50000 UNIT) CAPS capsule Take by mouth.   [DISCONTINUED] butalbital-acetaminophen-caffeine (FIORICET) 50-325-40 MG tablet butalbital-acetaminophen-caffeine 50 mg-325 mg-40 mg tablet   [DISCONTINUED] glipiZIDE (GLUCOTROL) 5 MG tablet Take 2 tablets (10 mg total) by mouth 2 (two) times daily before a  meal.   [DISCONTINUED] HYDROcodone-acetaminophen (NORCO) 10-325 MG tablet hydrocodone 10 mg-acetaminophen 325 mg tablet  Take 1 tablet every 4 hours by oral route.   [DISCONTINUED] levothyroxine (SYNTHROID) 75 MCG tablet Take 75 mcg by mouth daily.   [DISCONTINUED] lisdexamfetamine (VYVANSE) 70 MG capsule Take 1 capsule (70 mg total) by mouth in the morning.   [DISCONTINUED] lisdexamfetamine (VYVANSE) 70 MG capsule Take 1 capsule (70 mg total) by mouth daily.   [DISCONTINUED] pioglitazone (ACTOS) 30 MG tablet Take 1 tablet (30 mg total) by mouth daily.   [DISCONTINUED] sitaGLIPtin (JANUVIA) 100 MG tablet Take 1 tablet (100 mg total) by mouth daily.   No facility-administered encounter medications on file as of 06/19/2021.    ALLERGIES: No Known Allergies  VACCINATION STATUS: Immunization History  Administered Date(s) Administered   Tdap 01/28/2017    Diabetes She presents for her follow-up diabetic visit. She has type 2 diabetes mellitus. Onset time: She was diagnosed at approximate age of 45 years. Her disease course has been improving. Pertinent negatives for hypoglycemia include no confusion, headaches, nervousness/anxiousness, pallor, seizures or tremors. Associated symptoms include fatigue and polyuria. Pertinent negatives for diabetes include no chest pain, no foot ulcerations, no polydipsia, no polyphagia, no visual change and no weight loss. There are no hypoglycemic complications. Symptoms are improving. There are no diabetic complications. Risk factors for coronary artery disease include diabetes mellitus, dyslipidemia, obesity, sedentary lifestyle, tobacco exposure and hypertension. Current diabetic treatment includes oral agent (triple therapy). She is compliant with treatment most of the time. Her weight is increasing steadily. She is following a generally unhealthy diet. When asked about meal planning, she reported none. She has not had a previous visit with a dietitian. She rarely  participates in exercise. Her home blood glucose trend is fluctuating minimally. Her breakfast blood glucose range is generally 110-130 mg/dl. Her bedtime blood glucose range is generally 130-140 mg/dl. (She presents today with her logs, no meter, showing at near target fasting and postprandial glycemic profile.  Her POCT A1c today is 6.7%, essentially unchanged from previous visit of 6.8%.  She is asking about weight loss medications today as her weight continues to steadily increase despite her attempts at eating healthier.  She does have some mild fasting hypoglycemia noted, on days she sleeps in.) An ACE inhibitor/angiotensin II receptor blocker is not being taken. She does not see a podiatrist.Eye  exam is current.  Hyperlipidemia This is a chronic problem. The current episode started more than 1 year ago. The problem is uncontrolled (yet improving). Recent lipid tests were reviewed and are high. Exacerbating diseases include diabetes, hypothyroidism and obesity. Factors aggravating her hyperlipidemia include fatty foods and smoking. Pertinent negatives include no chest pain, myalgias or shortness of breath. Current antihyperlipidemic treatment includes statins and fibric acid derivatives. The current treatment provides moderate improvement of lipids. Compliance problems include adherence to diet and adherence to exercise.  Risk factors for coronary artery disease include diabetes mellitus, dyslipidemia, a sedentary lifestyle, obesity and hypertension.    Review of systems  Constitutional: + steadily increasing body weight-despite trying to eat healthier,  current Body mass index is 39.58 kg/m. , no fatigue, no subjective hyperthermia, no subjective hypothermia Eyes: no blurry vision, no xerophthalmia ENT: no sore throat, no nodules palpated in throat, no dysphagia/odynophagia, no hoarseness Cardiovascular: no chest pain, no shortness of breath, no palpitations, no leg swelling Respiratory: no cough,  no shortness of breath Gastrointestinal: no nausea/vomiting/diarrhea Musculoskeletal: no muscle/joint aches Skin: no rashes, no hyperemia Neurological: no tremors, no numbness, no tingling, no dizziness Psychiatric: no depression, no anxiety, history of bipolar disorder- controlled  Objective:     BP (!) 141/95   Pulse (!) 101   Ht 5' 3.5" (1.613 m)   Wt 227 lb (103 kg)   BMI 39.58 kg/m   Wt Readings from Last 3 Encounters:  06/19/21 227 lb (103 kg)  01/24/21 222 lb 3.2 oz (100.8 kg)  01/22/21 220 lb 9.6 oz (100.1 kg)    BP Readings from Last 3 Encounters:  06/19/21 (!) 141/95  01/24/21 (!) 146/90  01/22/21 124/78     Physical Exam- Limited  Constitutional:  Body mass index is 39.58 kg/m. , not in acute distress, normal state of mind Eyes:  EOMI, no exophthalmos Neck: Supple Cardiovascular: RRR, no murmurs, rubs, or gallops, no edema Respiratory: Adequate breathing efforts, no crackles, rales, rhonchi, or wheezing Musculoskeletal: no gross deformities, strength intact in all four extremities, no gross restriction of joint movements Skin:  no rashes, no hyperemia Neurological: no tremor with outstretched hands      CMP ( most recent) CMP     Component Value Date/Time   NA 140 05/25/2021 1038   K 4.8 05/25/2021 1038   CL 102 05/25/2021 1038   CO2 24 05/25/2021 1038   GLUCOSE 76 05/25/2021 1038   GLUCOSE 89 07/14/2014 0958   BUN 8 05/25/2021 1038   CREATININE 0.86 05/25/2021 1038   CREATININE 0.75 07/14/2014 0958   CALCIUM 10.1 05/25/2021 1038   PROT 7.1 05/25/2021 1038   ALBUMIN 4.8 05/25/2021 1038   AST 30 05/25/2021 1038   ALT 27 05/25/2021 1038   ALKPHOS 104 05/25/2021 1038   BILITOT <0.2 05/25/2021 1038   GFRNONAA 80 10/10/2020 0000     Diabetic Labs (most recent): Lab Results  Component Value Date   HGBA1C 6.7 (A) 06/19/2021   HGBA1C 6.8 01/24/2021   HGBA1C 6.7 10/10/2020     Lipid Panel     Component Value Date/Time   CHOL 179  10/10/2020 0000   TRIG 278 (A) 10/10/2020 0000   HDL 46 10/10/2020 0000   LDLCALC 93 10/10/2020 0000      Assessment & Plan:   1) Type 2 diabetes mellitus without complication, without long-term current use of insulin (HCC)  - Jaeden Westbay has currently uncontrolled symptomatic type 2 DM since  45 years of age.  She presents today with her logs, no meter, showing at near target fasting and postprandial glycemic profile.  Her POCT A1c today is 6.7%, essentially unchanged from previous visit of 6.8%.  She is asking about weight loss medications today as her weight continues to steadily increase despite her attempts at eating healthier.  She does have some mild fasting hypoglycemia noted, on days she sleeps in.  - I had a long discussion with her about the progressive nature of diabetes and the pathology behind its complications. -her diabetes is complicated by obesity/sedentary life, chronic  Smoking, sleep apnea and she remains at a high risk for more acute and chronic complications which include CAD, CVA, CKD, retinopathy, and neuropathy. These are all discussed in detail with her.  - Nutritional counseling repeated at each appointment due to patients tendency to fall back in to old habits.  - The patient admits there is a room for improvement in their diet and drink choices. -  Suggestion is made for the patient to avoid simple carbohydrates from their diet including Cakes, Sweet Desserts / Pastries, Ice Cream, Soda (diet and regular), Sweet Tea, Candies, Chips, Cookies, Sweet Pastries, Store Bought Juices, Alcohol in Excess of 1-2 drinks a day, Artificial Sweeteners, Coffee Creamer, and "Sugar-free" Products. This will help patient to have stable blood glucose profile and potentially avoid unintended weight gain.   - I encouraged the patient to switch to unprocessed or minimally processed complex starch and increased protein intake (animal or plant source), fruits, and vegetables.   -  Patient is advised to stick to a routine mealtimes to eat 3 meals a day and avoid unnecessary snacks (to snack only to correct hypoglycemia).  -She agrees [facility] RDE for assistance with glucose management and weight loss.  - I have approached her with the following individualized plan to manage  her diabetes and patient agrees:   - Given her controlled glycemic profile, she is advised to continue Glipizide 10 mg po twice daily with meals and continue Actos 30 mg po daily.  She can continue Januvia 100 mg po daily.  Unfortunately Medicaid does not provide adequate coverage for incretin therapy such as Trulicity, Ozempic, or Rybelsus.  -She is advised to continue monitoring glucose at least twice daily, before breakfast and before bed, and call the clinic if she has readings less than 70 or greater than 300 for 3 tests in a row.  - Specific targets for  A1c;  LDL, HDL,  and Triglycerides were discussed with the patient.  2) Blood Pressure /Hypertension: Her blood pressure is not controlled to target today.  She is not currently on any antihypertensive medications at this time.  If BP is elevated on 3 separate occasions, she will be considered for low dose ACE/ARB for renal protection from DM.  3) Lipids/Hyperlipidemia:  Her most recent lipid panel from 10/10/20 shows controlled LDL of 93 and improving triglycerides of 278.  She is advised to continue Crestor 20 mg po daily at bedtime and Fenofibrate 145 mg po daily.  Side effects and precautions discussed with her.  Will recheck lipid panel prior to next visit.  4)  Weight/Diet:  Her Body mass index is 39.58 kg/m.-   clearly complicating her diabetes care.   she is  a candidate for weight loss. I discussed with her the fact that loss of 5 - 10% of her  current body weight will have the most impact on her diabetes management.  Exercise, and detailed  carbohydrates information provided  -  detailed on discharge instructions.   Have set up dietician appt to discuss weight loss.  5) Chronic Care/Health Maintenance: -she is on Statin medications and  is encouraged to initiate and continue to follow up with Ophthalmology, Dentist, Podiatrist at least yearly or according to recommendations, and advised to  Quit smoking. I have recommended yearly flu vaccine and pneumonia vaccine at least every 5 years; moderate intensity exercise for up to 150 minutes weekly; and  sleep for at least 7 hours a day.  Smoking cessation instruction/counseling given:  counseled patient on the dangers of tobacco use, advised patient to stop smoking, and reviewed strategies to maximize success  - she is  advised to maintain close follow up with Smith Robert, MD for primary care needs, as well as her other providers for optimal and coordinated care.    I spent 45 minutes in the care of the patient today including review of labs from CMP, Lipids, Thyroid Function, Hematology (current and previous including abstractions from other facilities); face-to-face time discussing  her blood glucose readings/logs, discussing hypoglycemia and hyperglycemia episodes and symptoms, medications doses, her options of short and long term treatment based on the latest standards of care / guidelines;  discussion about incorporating lifestyle medicine;  and documenting the encounter.    Please refer to Patient Instructions for Blood Glucose Monitoring and Insulin/Medications Dosing Guide"  in media tab for additional information. Please  also refer to " Patient Self Inventory" in the Media  tab for reviewed elements of pertinent patient history.  [name] participated in the discussions, expressed understanding, and voiced agreement with the above plans.  All questions were answered to her satisfaction. she is encouraged to contact clinic should she have any questions or concerns prior to her return visit.    Follow up plan: - Return in about 4 months (around  10/19/2021) for Diabetes F/U with A1c in office, Previsit labs, Bring meter and logs.  Ronny Bacon, [facility] [address] [place], [place] [address] Phone: [phone] Fax: [phone]  06/19/2021, 9:32 AM

## 2021-06-19 NOTE — Patient Instructions (Addendum)
Advice for Weight Management  -For most of [place] the best way to lose weight is by diet management. Generally speaking, diet management means consuming less calories intentionally which over time brings about progressive weight loss.  This can be achieved more effectively by restricting carbohydrate consumption to the minimum possible.  So, it is critically important to know your numbers: how much calorie you are consuming and how much calorie you need. More importantly, our carbohydrates sources should be unprocessed or minimally processed complex starch food items.   Sometimes, it is important to balance nutrition by increasing protein intake (animal or plant source), fruits, and vegetables.  -Sticking to a routine mealtime to eat 3 meals a day and avoiding unnecessary snacks is shown to have a big role in weight control. Under normal circumstances, the only time we lose real weight is when we are hungry, so allow hunger to take place- hunger means no food between meal times, only water.  It is not advisable to starve.   -It is better to avoid simple carbohydrates including: Cakes, Sweet Desserts, Ice Cream, Soda (diet and regular), Sweet Tea, Candies, Chips, Cookies, Store Bought Juices, Alcohol in Excess of  1-2 drinks a day, Artificial Sweeteners, Doughnuts, Coffee Creamers, "Sugar-free" Products, etc, etc.  This is not a complete list...Marland Kitchen.    -Consulting with certified diabetes educators is proven to provide you with the most accurate and current information on diet.  Also, you may be  interested in discussing diet options/exchanges , we can schedule a visit with Jearld Fenton, RDN, CDE for individualized nutrition education.  -Exercise: If you are able: 30 -60 minutes a day ,4 days a week, or 150 minutes a week.  The longer the better.  Combine stretch, strength, and aerobic activities.  If you were told in the past that you have high risk for cardiovascular diseases, you may seek evaluation by  your heart doctor prior to initiating moderate to intense exercise programs.      Exercises to do While Sitting  Exercises that you do while sitting (chair exercises) can give you many of the same benefits as full exercise. Benefits include strengthening your heart, burning calories, and keeping muscles and joints healthy. Exercise can also improve your mood and help with depression andanxiety. You may benefit from chair exercises if you are unable to do standing exercises because of: Diabetic foot pain. Obesity. Illness. Arthritis. Recovery from surgery or injury. Breathing problems. Balance problems. Another type of disability. Before starting chair exercises, check with your health care provider or a physical therapist to find out how much exercise you can tolerate and which exercises are safe for you. If your health care provider approves: Start out slowly and build up over time. Aim to work up to about 10-20 minutes for each exercise session. Make exercise part of your daily routine. Drink water when you exercise. Do not wait until you are thirsty. Drink every 10-15 minutes. Stop exercising right away if you have pain, nausea, shortness of breath, or dizziness. If you are exercising in a wheelchair, make sure to lock the wheels. Ask your health care provider whether you can do tai chi or yoga. Many positions in these mind-body exercises can be modified to do while seated. Warm-up Before starting other exercises: Sit up as straight as you can. Have your knees bent at 90 degrees, which is the shape of the capital letter "L." Keep your feet flat on the floor. Sit at the front edge of your chair,  if you can. Pull in (tighten) the muscles in your abdomen and stretch your spine and neck as straight as you can. Hold this position for a few minutes. Breathe in and out evenly. Try to concentrate on your breathing, and relax your mind. Stretching Exercise A: Arm stretch Hold your arms out  straight in front of your body. Bend your hands at the wrist with your fingers pointing up, as if signaling someone to stop. Notice the slight tension in your forearms as you hold the position. Keeping your arms out and your hands bent, rotate your hands outward as far as you can and hold this stretch. Aim to have your thumbs pointing up and your pinkie fingers pointing down. Slowly repeat arm stretches for one minute as tolerated. Exercise B: Leg stretch If you can move your legs, try to "draw" letters on the floor with the toes of your foot. Write your name with one foot. Write your name with the toes of your other foot. Slowly repeat the movements for one minute as tolerated. Exercise C: Reach for the sky Reach your hands as far over your head as you can to stretch your spine. Move your hands and arms as if you are climbing a rope. Slowly repeat the movements for one minute as tolerated. Range of motion exercises Exercise A: Shoulder roll Let your arms hang loosely at your sides. Lift just your shoulders up toward your ears, then let them relax back down. When your shoulders feel loose, rotate your shoulders in backward and forward circles. Do shoulder rolls slowly for one minute as tolerated. Exercise B: March in place As if you are marching, pump your arms and lift your legs up and down. Lift your knees as high as you can. If you are unable to lift your knees, just pump your arms and move your ankles and feet up and down. March in place for one minute as tolerated. Exercise C: Seated jumping jacks Let your arms hang down straight. Keeping your arms straight, lift them up over your head. Aim to point your fingers to the ceiling. While you lift your arms, straighten your legs and slide your heels along the floor to your sides, as wide as you can. As you bring your arms back down to your sides, slide your legs back together. If you are unable to use your legs, just move your  arms. Slowly repeat seated jumping jacks for one minute as tolerated. Strengthening exercises Exercise A: Shoulder squeeze Hold your arms straight out from your body to your sides, with your elbows bent and your fists pointed at the ceiling. Keeping your arms in the bent position, move them forward so your elbows and forearms meet in front of your face. Open your arms back out as wide as you can with your elbows still bent, until you feel your shoulder blades squeezing together. Hold for 5 seconds. Slowly repeat the movements forward and backward for one minute as tolerated. Contact a health care provider if you: Had to stop exercising due to any of the following: Pain. Nausea. Shortness of breath. Dizziness. Fatigue. Have significant pain or soreness after exercising. Get help right away if you have: Chest pain. Difficulty breathing. These symptoms may represent a serious problem that is an emergency. Do not wait to see if the symptoms will go away. Get medical help right away. Call your local emergency services (911 in the [place] Do not drive yourself to the hospital. This information is not intended to  replace advice given to you by your health care provider. Make sure you discuss any questions you have with your healthcare provider. Document Revised: 03/20/2020 Document Reviewed: 04/06/2020 Elsevier Patient Education  2022 [employer].

## 2021-07-02 ENCOUNTER — Telehealth (HOSPITAL_COMMUNITY): Payer: No Typology Code available for payment source | Admitting: Psychiatry

## 2021-07-04 ENCOUNTER — Telehealth: Payer: Medicaid Other | Admitting: Nurse Practitioner

## 2021-07-04 NOTE — Telephone Encounter (Signed)
At this practice, we do not like to prescribe that class of medications (SGLT2i) due to the risks and side effects that are associated with them.  The side effects include upper respiratory infections, urinary tract infections, vaginal yeast infections, and the worst one is Fourniers Gangrene (flesh eating bacteria of her perineal area).  We have seen this happen on several different occasions and therefore do not prescribe that class of medications.

## 2021-07-04 NOTE — Telephone Encounter (Signed)
Pt is calling and states that she was told to contact her insurance company to see what diet pills were recommended on her insurance list.  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    [employer], [place]. - [place], [place] - [address] Phone:  [phone]  Fax:  [phone]

## 2021-07-04 NOTE — Telephone Encounter (Signed)
Called pt. No answer °

## 2021-07-04 NOTE — Telephone Encounter (Signed)
Pt.notified

## 2021-07-09 ENCOUNTER — Telehealth: Payer: Self-pay

## 2021-07-09 MED ORDER — LIRAGLUTIDE 18 MG/3ML ~~LOC~~ SOPN: 1.2000 mg | PEN_INJECTOR | Freq: Every day | SUBCUTANEOUS | 3 refills | Status: DC

## 2021-07-09 NOTE — Telephone Encounter (Signed)
Pt requesting a call from you.

## 2021-07-24 ENCOUNTER — Other Ambulatory Visit (HOSPITAL_COMMUNITY): Payer: Self-pay | Admitting: Emergency Medicine

## 2021-07-24 DIAGNOSIS — N611 Abscess of the breast and nipple: Secondary | ICD-10-CM

## 2021-07-27 ENCOUNTER — Other Ambulatory Visit: Payer: Self-pay

## 2021-07-27 ENCOUNTER — Telehealth (INDEPENDENT_AMBULATORY_CARE_PROVIDER_SITE_OTHER): Payer: No Typology Code available for payment source | Admitting: Psychiatry

## 2021-07-27 ENCOUNTER — Other Ambulatory Visit (HOSPITAL_COMMUNITY): Payer: Self-pay | Admitting: Neurology

## 2021-07-27 ENCOUNTER — Encounter (HOSPITAL_COMMUNITY): Payer: Self-pay | Admitting: Psychiatry

## 2021-07-27 DIAGNOSIS — M545 Low back pain, unspecified: Secondary | ICD-10-CM

## 2021-07-27 DIAGNOSIS — F902 Attention-deficit hyperactivity disorder, combined type: Secondary | ICD-10-CM

## 2021-07-27 DIAGNOSIS — F3162 Bipolar disorder, current episode mixed, moderate: Secondary | ICD-10-CM

## 2021-07-27 MED ORDER — LISDEXAMFETAMINE DIMESYLATE 70 MG PO CAPS: 70.0000 mg | ORAL_CAPSULE | Freq: Every day | ORAL | 0 refills | Status: DC

## 2021-07-27 MED ORDER — ALPRAZOLAM 1 MG PO TABS: 1.0000 mg | ORAL_TABLET | Freq: Three times a day (TID) | ORAL | 4 refills | Status: DC | PRN

## 2021-07-27 MED ORDER — DULOXETINE HCL 60 MG PO CPEP: 60.0000 mg | ORAL_CAPSULE | Freq: Two times a day (BID) | ORAL | 2 refills | Status: DC

## 2021-07-27 MED ORDER — CARIPRAZINE HCL 3 MG PO CAPS: ORAL_CAPSULE | ORAL | 3 refills | Status: DC

## 2021-07-27 NOTE — Progress Notes (Signed)
Virtual Visit via Telephone Note  I connected with Catherine Howard on 07/27/21 at 11:40 AM EDT by telephone and verified that I am speaking with the correct person using two identifiers.  Location: Patient: home Provider: home office   I discussed the limitations, risks, security and privacy concerns of performing an evaluation and management service by telephone and the availability of in person appointments. I also discussed with the patient that there may be a patient responsible charge related to this service. The patient expressed understanding and agreed to proceed.      I discussed the assessment and treatment plan with the patient. The patient was provided an opportunity to ask questions and all were answered. The patient agreed with the plan and demonstrated an understanding of the instructions.   The patient was advised to call back or seek an in-person evaluation if the symptoms worsen or if the condition fails to improve as anticipated.  I provided 15 minutes of non-face-to-face time during this encounter.   [name], MD  [facility] MD/PA/NP OP Progress Note  07/27/2021 11:52 AM Catherine Howard  MRN:  [id number]  Chief Complaint:  Chief Complaint   Anxiety; Depression; Follow-up; Manic Behavior    HPI: This patient is a 45 year old divorced white female who lives with her fianc, 12 year old daughter and 44 year old son in [place]. Her 67 year old son died of a drug overdose in Apr 28, 2013. The patient does not work and is on disability.  Patient returns for follow-up regarding her bipolar disorder and ADHD.  Overall she has been doing pretty well.  She and her parents are not speaking right now but they seem to go in cycles of this.  She states overall her mood has been fairly good she denies significant depression anxiety racing thoughts anger irritability or other manic symptoms.  She denies suicidal ideation.  Her sleep is variable but she often has to get up to go the bathroom due  to her diabetes.  Her blood sugars fairly well controlled on her A1c is 6.7.  However she drinks sugary sodas and I urged her to stop this. Visit Diagnosis:    ICD-10-CM   1. Bipolar 1 disorder, mixed, moderate (HCC)  F31.62     2. Attention deficit hyperactivity disorder (ADHD), combined type  F90.2       Past Psychiatric History: Outpatient treatment for the last several years  Past Medical History:  Past Medical History:  Diagnosis Date   ADHD    Bipolar I disorder (HCC)    Elevated cholesterol    GERD (gastroesophageal reflux disease)    Thyroid disease    Type 2 diabetes mellitus (HCC)    Vitamin D deficiency     Past Surgical History:  Procedure Laterality Date   CESAREAN SECTION     DILATION AND CURETTAGE OF UTERUS      Family Psychiatric History: see below  Family History:  Family History  Problem Relation Age of Onset   Schizophrenia Father    Alcohol abuse Father    Diabetes Father    Hyperlipidemia Father    Alcohol abuse Paternal Uncle     Social History:  Social History   Socioeconomic History   Marital status: Single    Spouse name: Not on file   Number of children: Not on file   Years of education: Not on file   Highest education level: Not on file  Occupational History   Not on file  Tobacco Use   Smoking status: Every Day  Packs/day: 2.00    Years: 32.00    Pack years: 64.00    Types: Cigarettes   Smokeless tobacco: Never   Tobacco comments:    smokes 3/4 of a pack a day--01/22/2021  Substance and Sexual Activity   Alcohol use: No    Alcohol/week: 0.0 standard drinks   Drug use: No   Sexual activity: Yes    Partners: Male    Birth control/protection: None  Other Topics Concern   Not on file  Social History Narrative   Not on file   Social Determinants of Health   Financial Resource Strain: Not on file  Food Insecurity: Not on file  Transportation Needs: Not on file  Physical Activity: Not on file  Stress: Not on file   Social Connections: Not on file    Allergies: No Known Allergies  Metabolic Disorder Labs: Lab Results  Component Value Date   HGBA1C 6.7 (A) 06/19/2021   No results found for: PROLACTIN Lab Results  Component Value Date   CHOL 179 10/10/2020   TRIG 278 (A) 10/10/2020   HDL 46 10/10/2020   LDLCALC 93 10/10/2020   LDLCALC 105 04/03/2020   Lab Results  Component Value Date   TSH 1.94 10/10/2020   TSH 6.40 (A) 07/06/2020    Therapeutic Level Labs: Lab Results  Component Value Date   LITHIUM 0.40 (L) 07/14/2014   No results found for: VALPROATE No components found for:  CBMZ  Current Medications: Current Outpatient Medications  Medication Sig Dispense Refill   lisdexamfetamine (VYVANSE) 70 MG capsule Take 1 capsule (70 mg total) by mouth daily. 30 capsule 0   lisdexamfetamine (VYVANSE) 70 MG capsule Take 1 capsule (70 mg total) by mouth daily. 30 capsule 0   ACCU-CHEK AVIVA PLUS test strip SMARTSIG:Via Meter     Accu-Chek Softclix Lancets lancets SMARTSIG:Topical     ALPRAZolam (XANAX) 1 MG tablet Take 1 tablet (1 mg total) by mouth 3 (three) times daily as needed for anxiety. 90 tablet 4   Azelastine-Fluticasone 137-50 MCG/ACT SUSP Place 1 spray into the nose in the morning and at bedtime. One spray each nostril two times daily     cariprazine (VRAYLAR) 3 MG capsule TAKE (1) CAPSULE BY MOUTH ONCE DAILY. 30 capsule 3   DULoxetine (CYMBALTA) 60 MG capsule Take 1 capsule (60 mg total) by mouth 2 (two) times daily. 180 capsule 2   esomeprazole (NEXIUM) 20 MG capsule Take 20 mg by mouth daily at 12 noon.     fenofibrate (TRICOR) 145 MG tablet TAKE 1 TABLET BY MOUTH ONCE DAILY. 90 tablet 2   Fluticasone-Umeclidin-Vilant (TRELEGY ELLIPTA) 100-62.5-25 MCG/INH AEPB Inhale 1 puff into the lungs daily in the afternoon.     gabapentin (NEURONTIN) 800 MG tablet Take 800 mg by mouth 2 (two) times daily.     glipiZIDE (GLUCOTROL) 5 MG tablet Take 2 tablets (10 mg total) by mouth 2  (two) times daily before a meal. 180 tablet 3   ipratropium (ATROVENT HFA) 17 MCG/ACT inhaler Inhale 2 puffs into the lungs every 6 (six) hours as needed for wheezing. 1 Inhaler 11   levocetirizine (XYZAL) 5 MG tablet Take 5 mg by mouth at bedtime.     levothyroxine (SYNTHROID) 75 MCG tablet Take 1 tablet (75 mcg total) by mouth daily. 90 tablet 3   liraglutide (VICTOZA) 18 MG/3ML SOPN Inject 1.2 mg into the skin daily. 12 mL 3   lisdexamfetamine (VYVANSE) 70 MG capsule Take 1 capsule (70 mg total) by  mouth daily. 30 capsule 0   nabumetone (RELAFEN) 500 MG tablet Take 500 mg by mouth 2 (two) times daily.     pioglitazone (ACTOS) 30 MG tablet Take 1 tablet (30 mg total) by mouth daily. 90 tablet 3   Vitamin D, Ergocalciferol, (DRISDOL) 1.25 MG (50000 UNIT) CAPS capsule Take by mouth.     No current facility-administered medications for this visit.     Musculoskeletal: Strength & Muscle Tone: within normal limits Gait & Station: normal Patient leans: N/A  Psychiatric Specialty Exam: Review of Systems  Musculoskeletal:  Positive for arthralgias.  All other systems reviewed and are negative.  There were no vitals taken for this visit.There is no height or weight on file to calculate BMI.  General Appearance: NA  Eye Contact:  NA  Speech:  Clear and Coherent  Volume:  Normal  Mood:  Euthymic  Affect:  NA  Thought Process:  Goal Directed  Orientation:  Full (Time, Place, and Person)  Thought Content: WDL   Suicidal Thoughts:  No  Homicidal Thoughts:  No  Memory:  Immediate;   Good Recent;   Good Remote;   Good  Judgement:  Good  Insight:  Fair  Psychomotor Activity:  Normal  Concentration:  Concentration: Good and Attention Span: Good  Recall:  Good  Fund of Knowledge: Good  Language: Good  Akathisia:  No  Handed:  Right  AIMS (if indicated): not done  Assets:  Communication Skills Desire for Improvement Resilience Social Support Talents/Skills  ADL's:  Intact   Cognition: WNL  Sleep:  Fair   Screenings: PHQ2-9    Flowsheet Row [facility]27[facility]20[facility]  PHQ-2 Total Score 1      Flowsheet Row Video Visit from 07/27/2021 in [facility]  C-SSRS RISK CATEGORY No Risk        Assessment and Plan: Patient is a 45 year old female with a history of bipolar disorder anxiety and ADHD.  She continues to do well on her current regimen.  She will continue Cymbalta 60 mg twice daily for depression, Vyvanse 70 mg every morning for ADHD, Vraylar 3 mg daily for mood stabilization and Xanax 1 mg 3 times daily for anxiety.  She will return to see me in 3 months   [name], MD 07/27/2021, 11:52 AM

## 2021-08-06 ENCOUNTER — Ambulatory Visit (INDEPENDENT_AMBULATORY_CARE_PROVIDER_SITE_OTHER): Payer: Medicaid Other | Admitting: Gastroenterology

## 2021-08-07 ENCOUNTER — Ambulatory Visit (HOSPITAL_COMMUNITY): Payer: Medicaid Other

## 2021-08-07 ENCOUNTER — Encounter (HOSPITAL_COMMUNITY): Payer: Medicaid Other

## 2021-08-08 ENCOUNTER — Telehealth: Payer: Self-pay | Admitting: Internal Medicine

## 2021-08-08 LAB — BASIC METABOLIC PANEL
BUN: 10 (ref 4–21)
CO2: 29 — AB (ref 13–22)
Chloride: 100 (ref 99–108)
Creatinine: 0.9 (ref 0.5–1.1)
Glucose: 137
Potassium: 5.1 (ref 3.4–5.3)
Sodium: 138 (ref 137–147)

## 2021-08-08 LAB — LIPID PANEL
Cholesterol: 247 — AB (ref 0–200)
HDL: 50 (ref 35–70)
LDL Cholesterol: 154
Triglycerides: 264 — AB (ref 40–160)

## 2021-08-08 LAB — COMPREHENSIVE METABOLIC PANEL
Albumin: 4.5 (ref 3.5–5.0)
Calcium: 10.1 (ref 8.7–10.7)
Globulin: 2.8

## 2021-08-08 LAB — HEPATIC FUNCTION PANEL
ALT: 33 (ref 7–35)
AST: 31 (ref 13–35)
Alkaline Phosphatase: 101 (ref 25–125)

## 2021-08-08 LAB — VITAMIN D 25 HYDROXY (VIT D DEFICIENCY, FRACTURES): Vit D, 25-Hydroxy: 42

## 2021-08-08 LAB — HEMOGLOBIN A1C: Hemoglobin A1C: 6.5

## 2021-08-08 MED ORDER — ATROVENT HFA 17 MCG/ACT IN AERS: 2.0000 | INHALATION_SPRAY | Freq: Four times a day (QID) | RESPIRATORY_TRACT | 11 refills | Status: AC | PRN

## 2021-08-08 NOTE — Telephone Encounter (Signed)
Called and spoke with patient who is requesting a refill on her Atrovent inhaler. Patient has been seen within the year and has been advised to keep her appointment in October. Refill has been sent to preferred pharmacy. Nothing further needed at this time.

## 2021-08-09 ENCOUNTER — Ambulatory Visit (HOSPITAL_COMMUNITY): Payer: Medicaid Other

## 2021-08-09 ENCOUNTER — Other Ambulatory Visit: Payer: Self-pay

## 2021-08-09 ENCOUNTER — Ambulatory Visit (HOSPITAL_COMMUNITY)
Admission: RE | Admit: 2021-08-09 | Discharge: 2021-08-09 | Disposition: A | Discharge status: Home / Self Care | Payer: Medicaid Other | Source: Ambulatory Visit | Attending: Neurology | Admitting: Neurology

## 2021-08-09 DIAGNOSIS — M545 Low back pain, unspecified: Secondary | ICD-10-CM | POA: Diagnosis not present

## 2021-08-16 ENCOUNTER — Inpatient Hospital Stay
Admission: RE | Admit: 2021-08-16 | Discharge: 2021-08-16 | Disposition: A | Discharge status: Home / Self Care | Payer: Self-pay | Source: Ambulatory Visit | Attending: Emergency Medicine | Admitting: Emergency Medicine

## 2021-08-16 ENCOUNTER — Ambulatory Visit (HOSPITAL_COMMUNITY)
Admission: RE | Admit: 2021-08-16 | Discharge: 2021-08-16 | Disposition: A | Discharge status: Home / Self Care | Payer: Medicaid Other | Source: Ambulatory Visit | Attending: Emergency Medicine | Admitting: Emergency Medicine

## 2021-08-16 ENCOUNTER — Ambulatory Visit
Admission: RE | Admit: 2021-08-16 | Discharge: 2021-08-16 | Disposition: A | Discharge status: Home / Self Care | Payer: Self-pay | Source: Ambulatory Visit | Attending: Emergency Medicine | Admitting: Emergency Medicine

## 2021-08-16 ENCOUNTER — Other Ambulatory Visit (HOSPITAL_COMMUNITY): Payer: Self-pay | Admitting: Emergency Medicine

## 2021-08-16 ENCOUNTER — Other Ambulatory Visit: Payer: Self-pay

## 2021-08-16 DIAGNOSIS — N611 Abscess of the breast and nipple: Secondary | ICD-10-CM

## 2021-08-16 DIAGNOSIS — R928 Other abnormal and inconclusive findings on diagnostic imaging of breast: Secondary | ICD-10-CM

## 2021-08-23 ENCOUNTER — Other Ambulatory Visit (HOSPITAL_COMMUNITY): Payer: Self-pay | Admitting: Emergency Medicine

## 2021-08-23 ENCOUNTER — Encounter (HOSPITAL_COMMUNITY): Payer: Self-pay

## 2021-08-23 ENCOUNTER — Other Ambulatory Visit: Payer: Self-pay | Admitting: Emergency Medicine

## 2021-08-23 ENCOUNTER — Ambulatory Visit (HOSPITAL_COMMUNITY): Payer: Medicaid Other

## 2021-08-23 DIAGNOSIS — M79604 Pain in right leg: Secondary | ICD-10-CM

## 2021-08-23 DIAGNOSIS — R609 Edema, unspecified: Secondary | ICD-10-CM

## 2021-08-28 ENCOUNTER — Ambulatory Visit (HOSPITAL_COMMUNITY): Payer: Medicaid Other

## 2021-09-04 ENCOUNTER — Other Ambulatory Visit (HOSPITAL_COMMUNITY): Payer: Medicaid Other

## 2021-09-04 ENCOUNTER — Encounter (HOSPITAL_COMMUNITY): Payer: Medicaid Other

## 2021-09-06 ENCOUNTER — Ambulatory Visit (HOSPITAL_COMMUNITY): Admission: RE | Admit: 2021-09-06 | Payer: Medicaid Other | Source: Ambulatory Visit

## 2021-09-06 ENCOUNTER — Encounter (HOSPITAL_COMMUNITY): Payer: Self-pay

## 2021-09-26 ENCOUNTER — Other Ambulatory Visit: Payer: Self-pay | Admitting: Family Medicine

## 2021-09-26 DIAGNOSIS — N611 Abscess of the breast and nipple: Secondary | ICD-10-CM

## 2021-10-02 ENCOUNTER — Ambulatory Visit: Payer: Medicaid Other | Admitting: Internal Medicine

## 2021-10-02 NOTE — Progress Notes (Deleted)
Catherine Howard, female    DOB: 11-20-1976,    MRN: [id number]   Brief patient profile:  46 yowf smoker with brother with Down Syndrome with last IUP > wt baseline then 196 in 2008 with onset around 2019 sob/cough with nl pfts 07/2018 x for low ERV rx chronically with Trelegy and prn atrovent and referred to pulmonary clinic in [place]  06/21/2020 by Catherine Howard     History of Present Illness  06/21/2020  Pulmonary/ 1st office eval/Catherine Howard  No vacicination / no trelegy on day of ov Chief Complaint  Patient presents with   Pulmonary Consult    Referred by Catherine Howard. Former Catherine Catherine Howard pt. She states has hx of asthma and COPD. Breathing is "so so today". She c/o cough and feeling of mucus stuck in her throat. She is using atrovent schedued qid.    Dyspnea: does flat surface ok all days, it's the hills and steps = doe  then use atrovent, never prechallenges MMRC1 = can walk nl pace, flat grade, can't hurry or go uphills or steps s sob   Cough: sporadic but does not wake her up/ something stuck in throat daytime but no mucus Sleep: doesn't bother her sleep and doesn't wake her up x months SABA use: says atrovent helps  Rec Will arrange for chest xray and pulmonary function test Will get copy of CPAP report from [facility] Follow up in 6 weeks    10/02/2021  f/u ov/Catherine Howard office/Catherine Howard re: copd 0/ uacs maint on ***  No chief complaint on file.   Dyspnea:  *** Cough: *** Sleeping: *** SABA use: *** 02: *** Covid status: *** Lung cancer screening: ***   No obvious day to day or daytime variability or assoc excess/ purulent sputum or mucus plugs or hemoptysis or cp or chest tightness, subjective wheeze or overt sinus or hb symptoms.   *** without nocturnal  or early am exacerbation  of respiratory  c/o's or need for noct saba. Also denies any obvious fluctuation of symptoms with weather or environmental changes or other aggravating or alleviating factors except as outlined above    No unusual exposure hx or h/o childhood pna/ asthma or knowledge of premature birth.  Current Allergies, Complete Past Medical History, Past Surgical History, Family History, and Social History were reviewed in Catherine Howard record.  ROS  The following are not active complaints unless bolded Hoarseness, sore throat, dysphagia, dental problems, itching, sneezing,  nasal congestion or discharge of excess mucus or purulent secretions, ear ache,   fever, chills, sweats, unintended wt loss or wt gain, classically pleuritic or exertional cp,  orthopnea pnd or arm/hand swelling  or leg swelling, presyncope, palpitations, abdominal pain, anorexia, nausea, vomiting, diarrhea  or change in bowel habits or change in bladder habits, change in stools or change in urine, dysuria, hematuria,  rash, arthralgias, visual complaints, headache, numbness, weakness or ataxia or problems with walking or coordination,  change in mood or  memory.        No outpatient medications have been marked as taking for the 10/02/21 encounter (Appointment) with Catherine Cowden, MD.                   Past Medical History:  Diagnosis Date   Elevated cholesterol    GERD (gastroesophageal reflux disease)    Thyroid disease    Vitamin D deficiency           Objective:  Wt Readings from Last 3 Encounters:  06/19/21 227 lb (103 kg)  01/24/21 222 lb 3.2 oz (100.8 kg)  01/22/21 220 lb 9.6 oz (100.1 kg)      Vital signs reviewed  10/02/2021  - Note at rest 02 sats  ***% on ***   General appearance:    ***        CXR PA and Lateral:   06/21/2020 :    I personally reviewed images and agree with radiology impression as follows:     Did not go for cxr as requested      Assessment

## 2021-10-04 ENCOUNTER — Telehealth: Payer: Self-pay

## 2021-10-04 NOTE — Telephone Encounter (Signed)
Noted  

## 2021-10-04 NOTE — Telephone Encounter (Signed)
Patient is requesting you give her a call

## 2021-10-04 NOTE — Telephone Encounter (Signed)
Tried calling patient, no answer, no option to leave voicemail.  If she calls back, please have her leave message regarding what the call is in reference to, or have her sign up for mychart so she can message me directly.

## 2021-10-08 NOTE — Telephone Encounter (Signed)
I need her to get her recent labs sent to me so I can review them and determine if maybe her fatigue could be caused by her thyroid.

## 2021-10-08 NOTE — Telephone Encounter (Signed)
Pt called back and states her sugar has been running fine and when she had her blood work done at [facility] they told her all her levels were fine including her thyroid levels. She states in the last 3-4 weeks she has been more tired than usual and not wanting to do anything and feels like this is coming from her thyroid. Requesting cb. [phone]

## 2021-10-08 NOTE — Telephone Encounter (Signed)
Called patient and gave her the message. Patient verbalized an understanding and patient called [facility] and requested them to send [place] her lab results.

## 2021-10-08 NOTE — Telephone Encounter (Signed)
She can continue the Victoza along with the Spironolactone, there are no significant drug interactions between the two

## 2021-10-08 NOTE — Telephone Encounter (Signed)
Called patient and gave her the message. Patient stated she will have her labs sent to [place]. Also patient stated that she has not taken the Victoza because she saw her dermatologist and they put her on Spironolactone 50 mg 1 tab PO daily for 2 weeks then increase to 2 tabs PO daily. Patient stated that she was put on this to remove the hair on her face and also a diuretic and she did not know if it is okay to take with the Victoza as she thought this will help her loose weight also and wanted to check with you about taking the Victoza. Please advise.

## 2021-10-09 NOTE — Telephone Encounter (Signed)
Labs were given to Joy.

## 2021-10-17 LAB — TSH: TSH: 0.58 (ref 0.41–5.90)

## 2021-10-17 LAB — VITAMIN D 25 HYDROXY (VIT D DEFICIENCY, FRACTURES): Vit D, 25-Hydroxy: 23.4

## 2021-10-18 LAB — LIPID PANEL
Chol/HDL Ratio: 5.7 ratio — ABNORMAL HIGH (ref 0.0–4.4)
Cholesterol, Total: 221 mg/dL — ABNORMAL HIGH (ref 100–199)
Cholesterol: 221 — AB (ref 0–200)
HDL: 39 (ref 35–70)
HDL: 39 mg/dL — ABNORMAL LOW
LDL Chol Calc (NIH): 124 mg/dL — ABNORMAL HIGH (ref 0–99)
LDL Cholesterol: 124
Triglycerides: 329 mg/dL — ABNORMAL HIGH (ref 0–149)
Triglycerides: 329 — AB (ref 40–160)
VLDL Cholesterol Cal: 58 mg/dL — ABNORMAL HIGH (ref 5–40)

## 2021-10-18 LAB — COMPREHENSIVE METABOLIC PANEL
ALT: 26 IU/L (ref 0–32)
AST: 24 IU/L (ref 0–40)
Albumin/Globulin Ratio: 1.7 (ref 1.2–2.2)
Albumin: 4.3 g/dL (ref 3.8–4.8)
Alkaline Phosphatase: 114 IU/L (ref 44–121)
BUN/Creatinine Ratio: 14 (ref 9–23)
BUN: 11 mg/dL (ref 6–24)
Bilirubin Total: <0.2 mg/dL (ref 0.0–1.2)
CO2: 24 mmol/L (ref 20–29)
Calcium: 10.2 mg/dL (ref 8.7–10.2)
Chloride: 100 mmol/L (ref 96–106)
Creatinine, Ser: 0.79 mg/dL (ref 0.57–1.00)
Globulin, Total: 2.6 g/dL (ref 1.5–4.5)
Glucose: 89 mg/dL (ref 70–99)
Potassium: 5 mmol/L (ref 3.5–5.2)
Sodium: 139 mmol/L (ref 134–144)
Total Protein: 6.9 g/dL (ref 6.0–8.5)
eGFR: 93 mL/min/{1.73_m2} (ref >59)

## 2021-10-18 LAB — T4, FREE: Free T4: 1.35 ng/dL (ref 0.82–1.77)

## 2021-10-18 LAB — TSH: TSH: 0.575 u[IU]/mL (ref 0.450–4.500)

## 2021-10-18 LAB — VITAMIN D 25 HYDROXY (VIT D DEFICIENCY, FRACTURES): Vit D, 25-Hydroxy: 23.4 ng/mL — ABNORMAL LOW (ref 30.0–100.0)

## 2021-10-18 NOTE — Patient Instructions (Signed)

## 2021-10-19 ENCOUNTER — Other Ambulatory Visit: Payer: Self-pay

## 2021-10-19 ENCOUNTER — Encounter: Payer: Self-pay | Admitting: Nurse Practitioner

## 2021-10-19 ENCOUNTER — Telehealth: Payer: Self-pay

## 2021-10-19 ENCOUNTER — Ambulatory Visit (INDEPENDENT_AMBULATORY_CARE_PROVIDER_SITE_OTHER): Payer: Medicaid Other | Admitting: Nurse Practitioner

## 2021-10-19 VITALS — BP 135/77 | HR 114 | Ht 63.5 in | Wt 222.0 lb

## 2021-10-19 DIAGNOSIS — E782 Mixed hyperlipidemia: Secondary | ICD-10-CM

## 2021-10-19 DIAGNOSIS — F17200 Nicotine dependence, unspecified, uncomplicated: Secondary | ICD-10-CM | POA: Diagnosis not present

## 2021-10-19 DIAGNOSIS — E119 Type 2 diabetes mellitus without complications: Secondary | ICD-10-CM | POA: Diagnosis not present

## 2021-10-19 NOTE — Telephone Encounter (Signed)
Called patient back and she is requesting a prescription for diabetic shoes be sent to [facility]. She stated that she tried to get without a prescription and her insurance denied them.

## 2021-10-19 NOTE — Telephone Encounter (Signed)
Patient is requesting a call back.

## 2021-10-19 NOTE — Telephone Encounter (Signed)
She shouldn't need a referral to see a podiatrist.  She can just call whomever she prefers and tell them she needs diabetic foot care.

## 2021-10-19 NOTE — Progress Notes (Signed)
10/19/2021, 9:06 AM Endocrinology Follow Up Visit   Subjective:    Patient ID: Catherine Howard, female    DOB: [birth year].  Catherine Howard is being seen in follow up for management of currently uncontrolled symptomatic diabetes requested by  Smith Robert, MD.   Past Medical History:  Diagnosis Date   ADHD    Bipolar I disorder (HCC)    Elevated cholesterol    GERD (gastroesophageal reflux disease)    Thyroid disease    Type 2 diabetes mellitus (HCC)    Vitamin D deficiency     Past Surgical History:  Procedure Laterality Date   CESAREAN SECTION     DILATION AND CURETTAGE OF UTERUS      Social History   Socioeconomic History   Marital status: Single    Spouse name: Not on file   Number of children: Not on file   Years of education: Not on file   Highest education level: Not on file  Occupational History   Not on file  Tobacco Use   Smoking status: Every Day    Packs/day: 2.00    Years: 32.00    Pack years: 64.00    Types: Cigarettes   Smokeless tobacco: Never   Tobacco comments:    smokes 3/4 of a pack a day--01/22/2021  Vaping Use   Vaping Use: Former  Substance and Sexual Activity   Alcohol use: No    Alcohol/week: 0.0 standard drinks   Drug use: No   Sexual activity: Yes    Partners: Male    Birth control/protection: None  Other Topics Concern   Not on file  Social History Narrative   Not on file   Social Determinants of Health   Financial Resource Strain: Not on file  Food Insecurity: Not on file  Transportation Needs: Not on file  Physical Activity: Not on file  Stress: Not on file  Social Connections: Not on file    Family History  Problem Relation Age of Onset   Schizophrenia Father    Alcohol abuse Father    Diabetes Father    Hyperlipidemia Father    Alcohol abuse Paternal Uncle     Outpatient Encounter Medications as of 10/19/2021   Medication Sig   ACCU-CHEK AVIVA PLUS test strip SMARTSIG:Via Meter   Accu-Chek Softclix Lancets lancets SMARTSIG:Topical   albuterol (VENTOLIN HFA) 108 (90 Base) MCG/ACT inhaler 1 puff as needed   ALPRAZolam (XANAX) 1 MG tablet Take 1 tablet (1 mg total) by mouth 3 (three) times daily as needed for anxiety.   Azelastine-Fluticasone 137-50 MCG/ACT SUSP Place 1 spray into the nose in the morning and at bedtime. One spray each nostril two times daily   cariprazine (VRAYLAR) 3 MG capsule TAKE (1) CAPSULE BY MOUTH ONCE DAILY.   DULoxetine (CYMBALTA) 60 MG capsule Take 1 capsule (60 mg total) by mouth 2 (two) times daily.   esomeprazole (NEXIUM) 20 MG capsule Take 20 mg by mouth daily at 12 noon.   fenofibrate (TRICOR) 145 MG tablet TAKE 1 TABLET BY MOUTH ONCE DAILY.   Fluticasone-Umeclidin-Vilant (TRELEGY ELLIPTA) 100-62.5-25 MCG/INH AEPB Inhale  1 puff into the lungs daily in the afternoon.   gabapentin (NEURONTIN) 400 MG capsule Take by mouth.   gabapentin (NEURONTIN) 600 MG tablet Take by mouth.   gabapentin (NEURONTIN) 800 MG tablet Take 800 mg by mouth 2 (two) times daily.   glipiZIDE (GLUCOTROL) 5 MG tablet Take 2 tablets (10 mg total) by mouth 2 (two) times daily before a meal.   HYDROcodone-acetaminophen (NORCO) 10-325 MG tablet Take by mouth.   ipratropium (ATROVENT HFA) 17 MCG/ACT inhaler Inhale 2 puffs into the lungs every 6 (six) hours as needed for wheezing.   levocetirizine (XYZAL) 5 MG tablet Take 5 mg by mouth at bedtime.   levothyroxine (SYNTHROID) 75 MCG tablet Take 1 tablet (75 mcg total) by mouth daily.   liraglutide (VICTOZA) 18 MG/3ML SOPN Inject 1.2 mg into the skin daily.   lisdexamfetamine (VYVANSE) 70 MG capsule Take 1 capsule (70 mg total) by mouth daily.   lisdexamfetamine (VYVANSE) 70 MG capsule Take 1 capsule (70 mg total) by mouth daily.   lisdexamfetamine (VYVANSE) 70 MG capsule Take 1 capsule (70 mg total) by mouth daily.   NYSTATIN powder Apply topically.    pioglitazone (ACTOS) 30 MG tablet Take 1 tablet (30 mg total) by mouth daily.   PROAIR HFA 108 (90 Base) MCG/ACT inhaler Inhale into the lungs.   spironolactone (ALDACTONE) 50 MG tablet Take 50 mg by mouth 2 (two) times daily.   Suvorexant (BELSOMRA) 15 MG TABS Belsomra 15 mg tablet  Take 1 tablet as needed by oral route at bedtime.   tiZANidine (ZANAFLEX) 4 MG tablet Take 4 mg by mouth 2 (two) times daily.   ULTICARE SHORT PEN NEEDLES 31G X 8 MM MISC Inject into the skin.   Vitamin D, Ergocalciferol, (DRISDOL) 1.25 MG (50000 UNIT) CAPS capsule Take by mouth.   [DISCONTINUED] JANUVIA 100 MG tablet Take 100 mg by mouth daily.   [DISCONTINUED] nabumetone (RELAFEN) 500 MG tablet Take 500 mg by mouth 2 (two) times daily. (Patient not taking: Reported on 10/19/2021)   No facility-administered encounter medications on file as of 10/19/2021.    ALLERGIES: No Known Allergies  VACCINATION STATUS: Immunization History  Administered Date(s) Administered   Tdap 01/28/2017    Diabetes She presents for her follow-up diabetic visit. She has type 2 diabetes mellitus. Onset time: She was diagnosed at approximate age of 53 years. Her disease course has been improving. Pertinent negatives for hypoglycemia include no confusion, headaches, nervousness/anxiousness, pallor, seizures or tremors. Associated symptoms include fatigue. Pertinent negatives for diabetes include no chest pain, no foot ulcerations, no polydipsia, no polyphagia, no polyuria, no visual change and no weight loss. There are no hypoglycemic complications. Symptoms are stable. There are no diabetic complications. Risk factors for coronary artery disease include diabetes mellitus, dyslipidemia, obesity, sedentary lifestyle, tobacco exposure and hypertension. Current diabetic treatment includes oral agent (dual therapy) (and Victoza). She is compliant with treatment most of the time (sometimes forgets to take her Victoza). Her weight is fluctuating  minimally. She is following a generally unhealthy diet. When asked about meal planning, she reported none. She has not had a previous visit with a dietitian. She rarely participates in exercise. Her home blood glucose trend is fluctuating minimally. Her breakfast blood glucose range is generally 110-130 mg/dl. Her bedtime blood glucose range is generally 130-140 mg/dl. (She presents today with her logs, no meter, showing at near target fasting and postprandial glycemic profile.  Her previsit A1c on 8/17 was 6.5%, improved from previous visit of  6.7%.  She reports she is under a great amount of stress, is caring for her father.  She reports she sometimes forgets to take her Victoza, but had some Januvia left over and began taking that instead.  She denies any significant hypoglycemia.) An ACE inhibitor/angiotensin II receptor blocker is not being taken. She does not see a podiatrist.Eye exam is current.  Hyperlipidemia This is a chronic problem. The current episode started more than 1 year ago. The problem is uncontrolled (yet improving). Recent lipid tests were reviewed and are high. Exacerbating diseases include diabetes, hypothyroidism and obesity. Factors aggravating her hyperlipidemia include fatty foods and smoking. Pertinent negatives include no chest pain, myalgias or shortness of breath. Current antihyperlipidemic treatment includes statins and fibric acid derivatives. The current treatment provides moderate improvement of lipids. Compliance problems include adherence to diet and adherence to exercise.  Risk factors for coronary artery disease include diabetes mellitus, dyslipidemia, a sedentary lifestyle, obesity and hypertension.    Review of systems  Constitutional: + Minimally fluctuating body weight,  current Body mass index is 38.71 kg/m. , + fatigue, no subjective hyperthermia, no subjective hypothermia Eyes: no blurry vision, no xerophthalmia ENT: no sore throat, no nodules palpated in  throat, no dysphagia/odynophagia, no hoarseness Cardiovascular: no chest pain, no shortness of breath, no palpitations, no leg swelling Respiratory: no cough, no shortness of breath Gastrointestinal: no nausea/vomiting/diarrhea Musculoskeletal: no muscle/joint aches Skin: no rashes, no hyperemia Neurological: no tremors, no numbness, no tingling, no dizziness Psychiatric: no depression, no anxiety, hx bipolar- currently controlled on meds  Objective:     BP 135/77   Pulse (!) 114   Ht 5' 3.5" (1.613 m)   Wt 222 lb (100.7 kg)   BMI 38.71 kg/m   Wt Readings from Last 3 Encounters:  10/19/21 222 lb (100.7 kg)  06/19/21 227 lb (103 kg)  01/24/21 222 lb 3.2 oz (100.8 kg)    BP Readings from Last 3 Encounters:  10/19/21 135/77  06/19/21 (!) 141/95  01/24/21 (!) 146/90    Physical Exam- Limited  Constitutional:  Body mass index is 38.71 kg/m. , not in acute distress, normal state of mind Eyes:  EOMI, no exophthalmos Neck: Supple Cardiovascular: RRR, no murmurs, rubs, or gallops, no edema Respiratory: Adequate breathing efforts, no crackles, rales, rhonchi, or wheezing Musculoskeletal: no gross deformities, strength intact in all four extremities, no gross restriction of joint movements Skin:  no rashes, no hyperemia Neurological: no tremor with outstretched hands      CMP ( most recent) CMP     Component Value Date/Time   NA 139 10/17/2021 1008   K 5.0 10/17/2021 1008   CL 100 10/17/2021 1008   CO2 24 10/17/2021 1008   GLUCOSE 89 10/17/2021 1008   GLUCOSE 89 07/14/2014 0958   BUN 11 10/17/2021 1008   CREATININE 0.79 10/17/2021 1008   CREATININE 0.75 07/14/2014 0958   CALCIUM 10.2 10/17/2021 1008   PROT 6.9 10/17/2021 1008   ALBUMIN 4.3 10/17/2021 1008   AST 24 10/17/2021 1008   ALT 26 10/17/2021 1008   ALKPHOS 114 10/17/2021 1008   BILITOT <0.2 10/17/2021 1008   GFRNONAA 80 10/10/2020 0000     Diabetic Labs (most recent): Lab Results  Component Value  Date   HGBA1C 6.5 08/08/2021   HGBA1C 6.7 (A) 06/19/2021   HGBA1C 6.8 01/24/2021     Lipid Panel     Component Value Date/Time   CHOL 221 (H) 10/17/2021 1008   TRIG 329 (H) 10/17/2021  1008   HDL 39 (L) 10/17/2021 1008   CHOLHDL 5.7 (H) 10/17/2021 1008   LDLCALC 124 (H) 10/17/2021 1008   LABVLDL 58 (H) 10/17/2021 1008      Assessment & Plan:   1) Type 2 diabetes mellitus without complication, without long-term current use of insulin (HCC)  - Catherine Howard has currently uncontrolled symptomatic type 2 DM since  45 years of age.  She presents today with her logs, no meter, showing at near target fasting and postprandial glycemic profile.  Her previsit A1c on 8/17 was 6.5%, improved from previous visit of 6.7%.  She reports she is under a great amount of stress, is caring for her father.  She reports she sometimes forgets to take her Victoza, but had some Januvia left over and began taking that instead.  She denies any significant hypoglycemia.  - I had a long discussion with her about the progressive nature of diabetes and the pathology behind its complications. -her diabetes is complicated by obesity/sedentary life, chronic  Smoking, sleep apnea and she remains at a high risk for more acute and chronic complications which include CAD, CVA, CKD, retinopathy, and neuropathy. These are all discussed in detail with her.  - Nutritional counseling repeated at each appointment due to patients tendency to fall back in to old habits.  - The patient admits there is a room for improvement in their diet and drink choices. -  Suggestion is made for the patient to avoid simple carbohydrates from their diet including Cakes, Sweet Desserts / Pastries, Ice Cream, Soda (diet and regular), Sweet Tea, Candies, Chips, Cookies, Sweet Pastries, Store Bought Juices, Alcohol in Excess of 1-2 drinks a day, Artificial Sweeteners, Coffee Creamer, and "Sugar-free" Products. This will help patient to have stable  blood glucose profile and potentially avoid unintended weight gain.   - I encouraged the patient to switch to unprocessed or minimally processed complex starch and increased protein intake (animal or plant source), fruits, and vegetables.   - Patient is advised to stick to a routine mealtimes to eat 3 meals a day and avoid unnecessary snacks (to snack only to correct hypoglycemia).  -She agrees to referral to [facility] [place] for assistance with glucose management and weight loss.  - I have approached her with the following individualized plan to manage  her diabetes and patient agrees:   - Given her controlled glycemic profile, she is advised to continue Glipizide 10 mg po twice daily with meals and continue Actos 30 mg po daily.  She is advised to continue Victoza 1.2 mg SQ daily and stop the Januvia as she expressed her desire to have some weight loss.  -She is advised to continue monitoring glucose at least twice daily, before breakfast and before bed, and call the clinic if she has readings less than 70 or greater than 300 for 3 tests in a row.  - Specific targets for  A1c;  LDL, HDL,  and Triglycerides were discussed with the patient.  2) Blood Pressure /Hypertension: Her blood pressure is controlled to target today.  She is not currently on any antihypertensive medications at this time.  If BP is elevated on 3 separate occasions, she will be considered for low dose ACE/ARB for renal protection from DM.  3) Lipids/Hyperlipidemia:  Her most recent lipid panel from 10/17/21 shows uncontrolled LDL of 124 and elevated triglycerides of 329.  She is advised to continue Crestor 20 mg po daily at bedtime and Fenofibrate 145 mg po daily.  Side effects and precautions discussed with her.  She is also advised to avoid fried foods and butter.  4)  Weight/Diet:  Her Body mass index is 38.71 kg/m.-   clearly complicating her diabetes care.   she is  a candidate for weight loss. I discussed with  her the fact that loss of 5 - 10% of her  current body weight will have the most impact on her diabetes management.  Exercise, and detailed carbohydrates information provided  -  detailed on discharge instructions.  Have set up dietician appt to discuss weight loss.  5) Hypothyroidism- unspecified Her previsit thyroid function tests are consistent with appropriate hormone replacement.  She is advised to continue her Levothyroxine 75 mcg po daily before breakfast.  - The correct intake of thyroid hormone (Levothyroxine, Synthroid), is on empty stomach first thing in the morning, with water, separated by at least 30 minutes from breakfast and other medications,  and separated by more than 4 hours from calcium, iron, multivitamins, acid reflux medications (PPIs).  - This medication is a life-long medication and will be needed to correct thyroid hormone imbalances for the rest of your life.  The dose may change from time to time, based on thyroid blood work.  - It is extremely important to be consistent taking this medication, near the same time each morning.  -AVOID TAKING PRODUCTS CONTAINING BIOTIN (commonly found in Hair, Skin, Nails vitamins) AS IT INTERFERES WITH THE VALIDITY OF THYROID FUNCTION BLOOD TESTS.  6) Vitamin D Deficiency Her most recent vitamin D level was 23.4 on 10/17/21.  She is currently taking Ergocalciferol 50000 units weekly, she is advised to continue.  7) Chronic Care/Health Maintenance: -she is on Statin medications and  is encouraged to initiate and continue to follow up with Ophthalmology, Dentist, Podiatrist at least yearly or according to recommendations, and advised to  Quit smoking. I have recommended yearly flu vaccine and pneumonia vaccine at least every 5 years; moderate intensity exercise for up to 150 minutes weekly; and  sleep for at least 7 hours a day.  Smoking cessation instruction/counseling given:  counseled patient on the dangers of tobacco use, advised  patient to stop smoking, and reviewed strategies to maximize success  - she is advised to maintain close follow up with Smith Robert, MD for primary care needs, as well as her other providers for optimal and coordinated care.     I spent 30 minutes in the care of the patient today including review of labs from CMP, Lipids, Thyroid Function, Hematology (current and previous including abstractions from other facilities); face-to-face time discussing  her blood glucose readings/logs, discussing hypoglycemia and hyperglycemia episodes and symptoms, medications doses, her options of short and long term treatment based on the latest standards of care / guidelines;  discussion about incorporating lifestyle medicine;  and documenting the encounter.    Please refer to Patient Instructions for Blood Glucose Monitoring and Insulin/Medications Dosing Guide"  in media tab for additional information. Please  also refer to " Patient Self Inventory" in the Media  tab for reviewed elements of pertinent patient history.  Catherine Howard participated in the discussions, expressed understanding, and voiced agreement with the above plans.  All questions were answered to her satisfaction. she is encouraged to contact clinic should she have any questions or concerns prior to her return visit.    Follow up plan: - Return in about 4 months (around 02/19/2022) for Diabetes F/U with A1c in office, No previsit labs, Bring meter  and logs.  Ronny Bacon, [facility] [address] [place], [place] [address] Phone: [phone] Fax: [phone]  10/19/2021, 9:06 AM

## 2021-10-19 NOTE — Telephone Encounter (Signed)
Called patient and gave her the message. Patient is requesting a referral for a podiatrist and stated that she usually wears bedroom slippers because they are more comfortable. When she wears her regular shoes they start to hurt her feet within 2 hours after wearing them.

## 2021-10-19 NOTE — Telephone Encounter (Signed)
She will have to go to a podiatrist first.  They typically will process the request, send a form here for me to sign and fax back before she can get them.

## 2021-10-19 NOTE — Telephone Encounter (Signed)
Called patient and made her aware. Gave her the number to [facility]. Patient verbalized an understanding and thanked me.

## 2021-10-22 ENCOUNTER — Telehealth (HOSPITAL_COMMUNITY): Payer: Self-pay | Admitting: *Deleted

## 2021-10-22 NOTE — Telephone Encounter (Signed)
Patient called to see if provider could prescribe her something else other then the Xanax due to something that's going on with her dad.  Staff was able to resch patient from Friday 10-26-2021 to 10-23-2021 so patient can discuss her med change with provider during a visit encounter. Patient agreed with appt

## 2021-10-23 ENCOUNTER — Other Ambulatory Visit: Payer: Self-pay | Admitting: Nurse Practitioner

## 2021-10-23 ENCOUNTER — Encounter (HOSPITAL_COMMUNITY): Payer: Self-pay | Admitting: Psychiatry

## 2021-10-23 ENCOUNTER — Ambulatory Visit: Payer: Medicaid Other | Admitting: General Surgery

## 2021-10-23 ENCOUNTER — Telehealth (INDEPENDENT_AMBULATORY_CARE_PROVIDER_SITE_OTHER): Payer: Medicaid Other | Admitting: Psychiatry

## 2021-10-23 ENCOUNTER — Other Ambulatory Visit: Payer: Self-pay

## 2021-10-23 DIAGNOSIS — F3162 Bipolar disorder, current episode mixed, moderate: Secondary | ICD-10-CM

## 2021-10-23 DIAGNOSIS — F902 Attention-deficit hyperactivity disorder, combined type: Secondary | ICD-10-CM | POA: Diagnosis not present

## 2021-10-23 MED ORDER — LISDEXAMFETAMINE DIMESYLATE 70 MG PO CAPS: 70.0000 mg | ORAL_CAPSULE | Freq: Every day | ORAL | 0 refills | Status: DC

## 2021-10-23 MED ORDER — DULOXETINE HCL 60 MG PO CPEP: 60.0000 mg | ORAL_CAPSULE | Freq: Two times a day (BID) | ORAL | 2 refills | Status: DC

## 2021-10-23 MED ORDER — BELSOMRA 20 MG PO TABS: 20.0000 mg | ORAL_TABLET | Freq: Every day | ORAL | 2 refills | Status: DC

## 2021-10-23 MED ORDER — CARIPRAZINE HCL 3 MG PO CAPS: ORAL_CAPSULE | ORAL | 3 refills | Status: DC

## 2021-10-23 MED ORDER — CLONAZEPAM 1 MG PO TABS: 1.0000 mg | ORAL_TABLET | Freq: Three times a day (TID) | ORAL | 2 refills | Status: DC

## 2021-10-23 NOTE — Progress Notes (Signed)
Virtual Visit via Telephone Note  I connected with Catherine Howard on 10/23/21 at  9:20 AM EDT by telephone and verified that I am speaking with the correct person using two identifiers.  Location: Patient: home Provider: office   I discussed the limitations, risks, security and privacy concerns of performing an evaluation and management service by telephone and the availability of in person appointments. I also discussed with the patient that there may be a patient responsible charge related to this service. The patient expressed understanding and agreed to proceed.      I discussed the assessment and treatment plan with the patient. The patient was provided an opportunity to ask questions and all were answered. The patient agreed with the plan and demonstrated an understanding of the instructions.   The patient was advised to call back or seek an in-person evaluation if the symptoms worsen or if the condition fails to improve as anticipated.  I provided 13 minutes of non-face-to-face time during this encounter.   [name], MD  [facility] MD/PA/NP OP Progress Note  10/23/2021 9:36 AM Catherine Howard  MRN:  [id number]  Chief Complaint:  Chief Complaint   Anxiety; Manic Behavior; Depression; ADD; Follow-up    HPI: This patient is a 45 year old divorced white female who lives with her fianc, 15 year old daughter and 20 year old son in [place]. Her 62 year old son died of a drug overdose in Apr 10, 2013. The patient does not work and is on disability.  The patient returns for follow-up after 3 months.  She states that both her parents have gotten more sick.  Her father has lung cancer.  She worries about them a lot.  She has been more anxious and irritable lately.  She does not think that the Xanax is working anymore and would like to try Klonopin which I think is reasonable.  She does not have any overt manic symptoms such as racing thoughts or hyperactivity or impulsivity.  However she has been  more irritable lately.  She is not sleeping as well as she would like so I suggested we increase her Belsomra dosage.  In general her health has been okay but she still suffers from low back pain and has to get injections and takes Lortab.  She denies a thoughts of self-harm or suicidal ideation Visit Diagnosis:    ICD-10-CM   1. Bipolar 1 disorder, mixed, moderate (HCC)  F31.62     2. Attention deficit hyperactivity disorder (ADHD), combined type  F90.2       Past Psychiatric History: Outpatient treatment for the last several years  Past Medical History:  Past Medical History:  Diagnosis Date   ADHD    Bipolar I disorder (HCC)    Elevated cholesterol    GERD (gastroesophageal reflux disease)    Thyroid disease    Type 2 diabetes mellitus (HCC)    Vitamin D deficiency     Past Surgical History:  Procedure Laterality Date   CESAREAN SECTION     DILATION AND CURETTAGE OF UTERUS      Family Psychiatric History: see below  Family History:  Family History  Problem Relation Age of Onset   Schizophrenia Father    Alcohol abuse Father    Diabetes Father    Hyperlipidemia Father    Alcohol abuse Paternal Uncle     Social History:  Social History   Socioeconomic History   Marital status: Single    Spouse name: Not on file   Number of children: Not on file  Years of education: Not on file   Highest education level: Not on file  Occupational History   Not on file  Tobacco Use   Smoking status: Every Day    Packs/day: 2.00    Years: 32.00    Pack years: 64.00    Types: Cigarettes   Smokeless tobacco: Never   Tobacco comments:    smokes 3/4 of a pack a day--01/22/2021  Vaping Use   Vaping Use: Former  Substance and Sexual Activity   Alcohol use: No    Alcohol/week: 0.0 standard drinks   Drug use: No   Sexual activity: Yes    Partners: Male    Birth control/protection: None  Other Topics Concern   Not on file  Social History Narrative   Not on file   Social  Determinants of Health   Financial Resource Strain: Not on file  Food Insecurity: Not on file  Transportation Needs: Not on file  Physical Activity: Not on file  Stress: Not on file  Social Connections: Not on file    Allergies: No Known Allergies  Metabolic Disorder Labs: Lab Results  Component Value Date   HGBA1C 6.5 08/08/2021   No results found for: PROLACTIN Lab Results  Component Value Date   CHOL 221 (H) 10/17/2021   TRIG 329 (H) 10/17/2021   HDL 39 (L) 10/17/2021   CHOLHDL 5.7 (H) 10/17/2021   LDLCALC 124 (H) 10/17/2021   LDLCALC 124 10/17/2021   Lab Results  Component Value Date   TSH 0.575 10/17/2021   TSH 0.58 10/17/2021    Therapeutic Level Labs: Lab Results  Component Value Date   LITHIUM 0.40 (L) 07/14/2014   No results found for: VALPROATE No components found for:  CBMZ  Current Medications: Current Outpatient Medications  Medication Sig Dispense Refill   clonazePAM (KLONOPIN) 1 MG tablet Take 1 tablet (1 mg total) by mouth 3 (three) times daily. 90 tablet 2   Suvorexant (BELSOMRA) 20 MG TABS Take 20 mg by mouth at bedtime. 30 tablet 2   ACCU-CHEK AVIVA PLUS test strip SMARTSIG:Via Meter     Accu-Chek Softclix Lancets lancets SMARTSIG:Topical     albuterol (VENTOLIN HFA) 108 (90 Base) MCG/ACT inhaler 1 puff as needed     Azelastine-Fluticasone 137-50 MCG/ACT SUSP Place 1 spray into the nose in the morning and at bedtime. One spray each nostril two times daily     cariprazine (VRAYLAR) 3 MG capsule TAKE (1) CAPSULE BY MOUTH ONCE DAILY. 30 capsule 3   DULoxetine (CYMBALTA) 60 MG capsule Take 1 capsule (60 mg total) by mouth 2 (two) times daily. 180 capsule 2   esomeprazole (NEXIUM) 20 MG capsule Take 20 mg by mouth daily at 12 noon.     fenofibrate (TRICOR) 145 MG tablet TAKE 1 TABLET BY MOUTH ONCE DAILY. 90 tablet 2   Fluticasone-Umeclidin-Vilant (TRELEGY ELLIPTA) 100-62.5-25 MCG/INH AEPB Inhale 1 puff into the lungs daily in the afternoon.      gabapentin (NEURONTIN) 600 MG tablet Take by mouth.     glipiZIDE (GLUCOTROL) 5 MG tablet Take 2 tablets (10 mg total) by mouth 2 (two) times daily before a meal. 180 tablet 3   HYDROcodone-acetaminophen (NORCO) 10-325 MG tablet Take by mouth.     ipratropium (ATROVENT HFA) 17 MCG/ACT inhaler Inhale 2 puffs into the lungs every 6 (six) hours as needed for wheezing. 1 each 11   levocetirizine (XYZAL) 5 MG tablet Take 5 mg by mouth at bedtime.     levothyroxine (SYNTHROID)  75 MCG tablet Take 1 tablet (75 mcg total) by mouth daily. 90 tablet 3   liraglutide (VICTOZA) 18 MG/3ML SOPN Inject 1.2 mg into the skin daily. 12 mL 3   lisdexamfetamine (VYVANSE) 70 MG capsule Take 1 capsule (70 mg total) by mouth daily. 30 capsule 0   lisdexamfetamine (VYVANSE) 70 MG capsule Take 1 capsule (70 mg total) by mouth daily. 30 capsule 0   lisdexamfetamine (VYVANSE) 70 MG capsule Take 1 capsule (70 mg total) by mouth daily. 30 capsule 0   NYSTATIN powder Apply topically.     pioglitazone (ACTOS) 30 MG tablet Take 1 tablet (30 mg total) by mouth daily. 90 tablet 3   PROAIR HFA 108 (90 Base) MCG/ACT inhaler Inhale into the lungs.     spironolactone (ALDACTONE) 50 MG tablet Take 50 mg by mouth 2 (two) times daily.     tiZANidine (ZANAFLEX) 4 MG tablet Take 4 mg by mouth 2 (two) times daily.     ULTICARE SHORT PEN NEEDLES 31G X 8 MM MISC Inject into the skin.     Vitamin D, Ergocalciferol, (DRISDOL) 1.25 MG (50000 UNIT) CAPS capsule Take by mouth.     No current facility-administered medications for this visit.     Musculoskeletal: Strength & Muscle Tone: na Gait & Station: na Patient leans: N/A  Psychiatric Specialty Exam: Review of Systems  Musculoskeletal:  Positive for back pain.  Psychiatric/Behavioral:  Positive for sleep disturbance. The patient is nervous/anxious.   All other systems reviewed and are negative.  There were no vitals taken for this visit.There is no height or weight on file to  calculate BMI.  General Appearance: NA  Eye Contact:  NA  Speech:  Clear and Coherent  Volume:  Normal  Mood:  Anxious and Euthymic  Affect:  NA  Thought Process:  Goal Directed  Orientation:  Full (Time, Place, and Person)  Thought Content: Rumination   Suicidal Thoughts:  No  Homicidal Thoughts:  No  Memory:  Immediate;   Good Recent;   Good Remote;   Fair  Judgement:  Good  Insight:  Fair  Psychomotor Activity:  Normal  Concentration:  Concentration: Good and Attention Span: Good  Recall:  Good  Fund of Knowledge: Good  Language: Good  Akathisia:  No  Handed:  Right  AIMS (if indicated): not done  Assets:  Communication Skills Desire for Improvement Resilience Social Support Talents/Skills  ADL's:  Intact  Cognition: WNL  Sleep:  Poor   Screenings: PHQ2-9    Flowsheet Row [facility] Video Visit from 07/27/2021 in [facility]  PHQ-2 Total Score 1 1      Flowsheet Row Video Visit from 10/23/2021 in [facility] Video Visit from 07/27/2021 in [facility]  C-SSRS RISK CATEGORY No Risk No Risk        Assessment and Plan: This patient is a 45 year old female with a history of bipolar disorder anxiety and ADHD.  She has been more anxious lately and not sleeping as well.  She will increase Belsomra to 20 mg at bedtime for sleep.  She will discontinue Xanax in favor of clonazepam 1 mg 3 times daily for anxiety.  She will continue Cymbalta 60 mg twice daily for depression, Vyvanse 70 mg a morning for ADHD and Vraylar 3 mg daily for mood stabilization.  She will return to see me in 3 months   [name],  MD 10/23/2021, 9:36 AM

## 2021-10-26 ENCOUNTER — Telehealth (HOSPITAL_COMMUNITY): Payer: No Typology Code available for payment source | Admitting: Psychiatry

## 2021-10-29 ENCOUNTER — Telehealth: Payer: Self-pay | Admitting: Nurse Practitioner

## 2021-10-29 ENCOUNTER — Other Ambulatory Visit: Payer: Self-pay

## 2021-10-29 MED ORDER — ULTICARE SHORT PEN NEEDLES 31G X 8 MM MISC: 1 refills | Status: DC

## 2021-10-29 NOTE — Telephone Encounter (Signed)
Pt is calling to request Novofin 32 tip needle for her victoza to be sent in.   [facility] - [place], [place] - [address] Phone:  [phone]  Fax:  [phone]

## 2021-10-29 NOTE — Telephone Encounter (Signed)
Pen needles has been sent to the pharmacy requested.

## 2021-11-16 ENCOUNTER — Other Ambulatory Visit: Payer: Self-pay

## 2021-11-16 ENCOUNTER — Encounter: Payer: Self-pay | Admitting: Internal Medicine

## 2021-11-16 ENCOUNTER — Ambulatory Visit (INDEPENDENT_AMBULATORY_CARE_PROVIDER_SITE_OTHER): Payer: Medicaid Other | Admitting: Internal Medicine

## 2021-11-16 DIAGNOSIS — J449 Chronic obstructive pulmonary disease, unspecified: Secondary | ICD-10-CM | POA: Diagnosis not present

## 2021-11-16 DIAGNOSIS — R058 Other specified cough: Secondary | ICD-10-CM | POA: Diagnosis not present

## 2021-11-16 DIAGNOSIS — F17210 Nicotine dependence, cigarettes, uncomplicated: Secondary | ICD-10-CM

## 2021-11-16 MED ORDER — BUDESONIDE-FORMOTEROL FUMARATE 80-4.5 MCG/ACT IN AERO: INHALATION_SPRAY | RESPIRATORY_TRACT | 12 refills | Status: DC

## 2021-11-16 MED ORDER — ESOMEPRAZOLE MAGNESIUM 40 MG PO CPDR: DELAYED_RELEASE_CAPSULE | ORAL | 2 refills | Status: DC

## 2021-11-16 NOTE — Progress Notes (Signed)
Catherine Howard, female    DOB: July 22, 1976,    MRN: [id number]   Brief patient profile:  54  yowf smoker with brother with Down Syndrome with last IUP > wt baseline then 196 in 2008 with onset around 2019 sob/cough with nl pfts 07/2018 x for low ERV rx chronically with Trelegy and prn atrovent and referred to pulmonary clinic in [place]  06/21/2020 by Dr  Bartolo Darter     History of Present Illness  06/21/2020  Pulmonary/ 1st office eval/Catherine Howard  No vacicination / no trelegy on day of ov Chief Complaint  Patient presents with   Pulmonary Consult    Referred by Dr. Vidal Schwalbe. Former Dr Luan Pulling pt. She states has hx of asthma and COPD. Breathing is "so so today". She c/o cough and feeling of mucus stuck in her throat. She is using atrovent schedued qid.   Dyspnea: does flat surface ok all days, it's the hills and steps = doe  then use atrovent, never prechallenges MMRC1 = can walk nl pace, flat grade, can't hurry or go uphills or steps s sob   Cough: sporadic but does not wake her up/ something stuck in throat daytime but no mucus Sleep: doesn't bother her sleep and doesn't wake her up x months SABA use: says atrovent helps  Rec Stop the trelegy now  Try Atrovent 30  min before an activity that you know would make you short of breath  Continue nexium Take 30- 60 min before your first and last meals of the day  GERD diet reviewed, bed blocks rec  I strongly recommend the moderna or pfizer as soon as you can based on proven safety and effectiveness in over 150 million [place] The key is to stop smoking completely before smoking completely stops you!   11/16/2021  f/u ov/[place] office/Catherine Howard re: GOLD 0 copd maint on trelegy and atovent hfa prn but technique very poor   Chief Complaint  Patient presents with   Follow-up    Persistent dry cough. Feels she has some breathing struggles but thinks may be related to anxiety.   Dyspnea:  MMRC1 = can walk nl pace, flat grade, can't hurry or go  uphills or steps s sob   Walks to [place] and back can take up to 30 min flat grade  Cough: wakes in am with it/ feels sensation of globus / gagging but dry  Sleeping: most nights wakes her up bed is flat  SABA use: using atrovent once a day seem sto help  02: none  Covid status: never vax      No obvious day to day or daytime variability or assoc excess/ purulent sputum or mucus plugs or hemoptysis or cp or chest tightness, subjective wheeze or overt sinus or hb symptoms.   Sleeping  without nocturnal  or early am exacerbation  of respiratory  c/o's or need for noct saba. Also denies any obvious fluctuation of symptoms with weather or environmental changes or other aggravating or alleviating factors except as outlined above   No unusual exposure hx or h/o childhood pna/ asthma or knowledge of premature birth.  Current Allergies, Complete Past Medical History, Past Surgical History, Family History, and Social History were reviewed in [employer] record.  ROS  The following are not active complaints unless bolded Hoarseness, sore throat, dysphagia, dental problems, itching, sneezing,  nasal congestion or discharge of excess mucus or purulent secretions, ear ache,   fever, chills, sweats, unintended wt loss or wt gain,  classically pleuritic or exertional cp,  orthopnea pnd or arm/hand swelling  or leg swelling, presyncope, palpitations, abdominal pain, anorexia, nausea, vomiting, diarrhea  or change in bowel habits or change in bladder habits, change in stools or change in urine, dysuria, hematuria,  rash, arthralgias, visual complaints, headache, numbness, weakness or ataxia or problems with walking or coordination,  change in mood= anxious  or  memory.        Current Meds  Medication Sig   ACCU-CHEK AVIVA PLUS test strip SMARTSIG:Via Meter   Accu-Chek Softclix Lancets lancets SMARTSIG:Topical   Azelastine-Fluticasone 137-50 MCG/ACT SUSP Place 1 spray into the nose in the  morning and at bedtime. One spray each nostril two times daily   cariprazine (VRAYLAR) 3 MG capsule TAKE (1) CAPSULE BY MOUTH ONCE DAILY.   clonazePAM (KLONOPIN) 1 MG tablet Take 1 tablet (1 mg total) by mouth 3 (three) times daily.   DULoxetine (CYMBALTA) 60 MG capsule Take 1 capsule (60 mg total) by mouth 2 (two) times daily.   esomeprazole (NEXIUM) 20 MG capsule Take 20 mg by mouth daily at 12 noon.   fenofibrate (TRICOR) 145 MG tablet TAKE 1 TABLET BY MOUTH ONCE DAILY.   Fluticasone-Umeclidin-Vilant (TRELEGY ELLIPTA) 100-62.5-25 MCG/INH AEPB Inhale 1 puff into the lungs daily in the afternoon.   gabapentin (NEURONTIN) 600 MG tablet Take by mouth.   glipiZIDE (GLUCOTROL) 5 MG tablet Take 2 tablets (10 mg total) by mouth 2 (two) times daily before a meal.   HYDROcodone-acetaminophen (NORCO) 10-325 MG tablet Take by mouth.   ipratropium (ATROVENT HFA) 17 MCG/ACT inhaler Inhale 2 puffs into the lungs every 6 (six) hours as needed for wheezing.   levocetirizine (XYZAL) 5 MG tablet Take 5 mg by mouth at bedtime.   levothyroxine (SYNTHROID) 75 MCG tablet Take 1 tablet (75 mcg total) by mouth daily.   lisdexamfetamine (VYVANSE) 70 MG capsule Take 1 capsule (70 mg total) by mouth daily.   lisdexamfetamine (VYVANSE) 70 MG capsule Take 1 capsule (70 mg total) by mouth daily.   lisdexamfetamine (VYVANSE) 70 MG capsule Take 1 capsule (70 mg total) by mouth daily.   NYSTATIN powder Apply topically.   pioglitazone (ACTOS) 30 MG tablet Take 1 tablet (30 mg total) by mouth daily.   PROAIR HFA 108 (90 Base) MCG/ACT inhaler Inhale into the lungs.   spironolactone (ALDACTONE) 50 MG tablet Take 50 mg by mouth 2 (two) times daily.   Suvorexant (BELSOMRA) 20 MG TABS Take 20 mg by mouth at bedtime.   tiZANidine (ZANAFLEX) 4 MG tablet Take 4 mg by mouth 2 (two) times daily.   ULTICARE SHORT PEN NEEDLES 31G X 8 MM MISC Use to inject Victoza once daily.   VICTOZA 18 MG/3ML SOPN INJECT 1.2 MG INTO THE SKIN ONE DAILY.    Vitamin D, Ergocalciferol, (DRISDOL) 1.25 MG (50000 UNIT) CAPS capsule Take by mouth.          Past Medical History:  Diagnosis Date   Elevated cholesterol    GERD (gastroesophageal reflux disease)    Thyroid disease    Vitamin D deficiency           Objective:     Wt Readings from Last 3 Encounters:  11/16/21 221 lb 0.6 oz (100.3 kg)  10/19/21 222 lb (100.7 kg)  06/19/21 227 lb (103 kg)      Vital signs reviewed  11/16/2021  - Note at rest 02 sats  100% on RA   General appearance:    obese amb  wf nad   HEENT : pt wearing mask not removed for exam due to covid -19 concerns.    NECK :  without JVD/Nodes/TM/ nl carotid upstrokes bilaterally   LUNGS: no acc muscle use,  Nl contour chest which is clear to A and P bilaterally without cough on insp or exp maneuvers   CV:  RRR  no s3 or murmur or increase in P2, and no edema   ABD: obese soft and nontender with nl inspiratory excursion in the supine position. No bruits or organomegaly appreciated, bowel sounds nl  MS:  Nl gait/ ext warm without deformities, calf tenderness, cyanosis or clubbing No obvious joint restrictions   SKIN: warm and dry without lesions    NEURO:  alert, approp, nl sensorium with  no motor or cerebellar deficits apparent.          Assessment

## 2021-11-16 NOTE — Assessment & Plan Note (Signed)
Try off dpi and on max gerd rx 06/21/2020  - try again 11/16/2021 >>>   Not clear she understood prior rx so worth repeating see avs for instructions unique to this ov

## 2021-11-16 NOTE — Assessment & Plan Note (Addendum)
Active smoker  - PFT's  08/18/18   FEV1 2.52 (88 % ) ratio 0.87  p no  % improvement from saba p ? prior to study with DLCO  15.25 (66%) corrects to 3.93 (84%)  for alv volume and FV curve nl with ERV 26%  - 06/21/2020  After extensive coaching inhaler device,  effectiveness =    25% at baseline (double triggers at end insp > > d/c trelegy (? Causing globus sensation, no change doe on vs off clinically)  Still no evidence of copd or demonstable airflow obst so no need for trelegy and ? More of an AB picture anyway so rec try symb 80 2bid as less likely to cause cough and use atrovent prn  11/16/2021  After extensive coaching inhaler device,  effectiveness =    80% with atrovent so should be able to master symbicort easily

## 2021-11-16 NOTE — Assessment & Plan Note (Signed)
Counseled re importance of smoking cessation but did not meet time criteria for separate billing   ? ?    ?Each maintenance medication was reviewed in detail including emphasizing most importantly the difference between maintenance and prns and under what circumstances the prns are to be triggered using an action plan format where appropriate. ? ?Total time for H and P, chart review, counseling, reviewing hfa device(s) and generating customized AVS unique to this office visit / same day charting = 31 min  ?     ?

## 2021-11-16 NOTE — Patient Instructions (Signed)
Plan A = Automatic = Always=    symbicort 80 Take 2 puffs first thing in am and then another 2 puffs about 12 hours later.    Work on inhaler technique:  relax and gently blow all the way out then take a nice smooth full deep breath back in, triggering the inhaler at same time you start breathing in.  Hold for up to 5 seconds if you can. Blow out thru nose. Rinse and gargle with water when done.  If mouth or throat bother you at all,  try brushing teeth/gums/tongue with arm and hammer toothpaste/ make a slurry and gargle and spit out.      Changed nexium 40 mg Take 30- 60 min before your first and last meals of the day   Plan B = Backup (to supplement plan A, not to replace it) Only use your albuterol inhaler as a rescue medication to be used if you can't catch your breath by resting or doing a relaxed purse lip breathing pattern.  - The less you use it, the better it will work when you need it. - Ok to use the inhaler up to 2 puffs  every 4 hours if you must but call for appointment if use goes up over your usual need - Don't leave home without it !!  (think of it like the spare tire for your car)   GERD (REFLUX)  is an extremely common cause of respiratory symptoms just like yours , many times with no obvious heartburn at all.    It can be treated with medication, but also with lifestyle changes including elevation of the head of your bed (ideally with 6 -8inch blocks under the headboard of your bed),  Smoking cessation, avoidance of late meals, excessive alcohol, and avoid fatty foods, chocolate, peppermint, colas, red wine, and acidic juices such as orange juice.  NO MINT OR MENTHOL PRODUCTS SO NO COUGH DROPS  USE SUGARLESS CANDY INSTEAD (Jolley ranchers or Stover's or Life Savers) or even ice chips will also do - the key is to swallow to prevent all throat clearing. NO OIL BASED VITAMINS - use powdered substitutes.  Avoid fish oil when coughing.     Please schedule a follow up office visit  in 6 weeks, call sooner if needed with all medications /inhalers/ solutions in hand so we can verify exactly what you are taking. This includes all medications from all doctors and over the counters

## 2021-11-26 ENCOUNTER — Telehealth: Payer: Self-pay

## 2021-11-26 NOTE — Telephone Encounter (Signed)
Started PA for Victoza 18mg /50mL with [employer] at [phone].

## 2021-11-27 NOTE — Telephone Encounter (Signed)
Called [name] and spoke to [name] and she stated that the PA has been approved for Victoza. Reference ID# [id number]

## 2021-12-03 ENCOUNTER — Telehealth (HOSPITAL_COMMUNITY): Payer: Self-pay | Admitting: *Deleted

## 2021-12-03 NOTE — Telephone Encounter (Signed)
PA for Belsomra form started and waiting for provider

## 2021-12-05 ENCOUNTER — Telehealth (HOSPITAL_COMMUNITY): Payer: Self-pay | Admitting: *Deleted

## 2021-12-05 NOTE — Telephone Encounter (Signed)
Opened in Error.

## 2021-12-05 NOTE — Telephone Encounter (Signed)
LMOM to inform patient that her PA form was faxed to her insurance. Office number provided.

## 2021-12-06 ENCOUNTER — Encounter (INDEPENDENT_AMBULATORY_CARE_PROVIDER_SITE_OTHER): Payer: Self-pay

## 2021-12-06 ENCOUNTER — Other Ambulatory Visit (INDEPENDENT_AMBULATORY_CARE_PROVIDER_SITE_OTHER): Payer: Self-pay

## 2021-12-06 ENCOUNTER — Telehealth (INDEPENDENT_AMBULATORY_CARE_PROVIDER_SITE_OTHER): Payer: Self-pay

## 2021-12-06 ENCOUNTER — Other Ambulatory Visit: Payer: Self-pay

## 2021-12-06 ENCOUNTER — Encounter (INDEPENDENT_AMBULATORY_CARE_PROVIDER_SITE_OTHER): Payer: Self-pay | Admitting: Gastroenterology

## 2021-12-06 ENCOUNTER — Ambulatory Visit (INDEPENDENT_AMBULATORY_CARE_PROVIDER_SITE_OTHER): Payer: Medicaid Other | Admitting: Gastroenterology

## 2021-12-06 DIAGNOSIS — K219 Gastro-esophageal reflux disease without esophagitis: Secondary | ICD-10-CM

## 2021-12-06 DIAGNOSIS — R0989 Other specified symptoms and signs involving the circulatory and respiratory systems: Secondary | ICD-10-CM

## 2021-12-06 DIAGNOSIS — R7989 Other specified abnormal findings of blood chemistry: Secondary | ICD-10-CM

## 2021-12-06 DIAGNOSIS — R1319 Other dysphagia: Secondary | ICD-10-CM

## 2021-12-06 DIAGNOSIS — K589 Irritable bowel syndrome without diarrhea: Secondary | ICD-10-CM | POA: Insufficient documentation

## 2021-12-06 DIAGNOSIS — K581 Irritable bowel syndrome with constipation: Secondary | ICD-10-CM

## 2021-12-06 DIAGNOSIS — R1310 Dysphagia, unspecified: Secondary | ICD-10-CM | POA: Insufficient documentation

## 2021-12-06 MED ORDER — PEG 3350-KCL-NA BICARB-NACL 420 G PO SOLR: 4000.0000 mL | ORAL | 0 refills | Status: DC

## 2021-12-06 MED ORDER — LINACLOTIDE 72 MCG PO CAPS: 72.0000 ug | ORAL_CAPSULE | Freq: Every day | ORAL | 1 refills | Status: DC

## 2021-12-06 NOTE — Patient Instructions (Signed)
Schedule EGD and colonoscopy Schedule liver US Perform blood workup Start Linzess 72 mcg qday Continue working on weight loss Continue Nexium 40 mg qday

## 2021-12-06 NOTE — Telephone Encounter (Signed)
Catherine Howard, CMA  

## 2021-12-06 NOTE — Progress Notes (Signed)
Catherine Howard, M.D. Gastroenterology & Hepatology [facility] [address] [place], [place] [address] Primary Care Physician: Smith RobertKikel, Stephen, MD [address] [place] [id number]  Referring MD: PCP  Chief Complaint:  elevated LFTs, constipation and dysphagia  History of Present Illness: Catherine DuckingMichelle Howard is a 45 y.o. female with past medical history of bipolar disorder, ADHD, hyperlipidemia, GERD, hypothyroidism, diabetes, who presents for evaluation of elevation of her aminotransferases, dysphagia and constipation.  Patient reports that she has chronically felt "sticky phlegm" in esophagus for multiple years. This has not improved and she states she has to gag frequently. States she feels some dysphagia to pills and sometimes when eating steak. Does not vomit if she feels dysphagia but takes longer to go down. She has a history of GERD for a long time for which she takes Nexium 40 mg (has been taking it chronically), which she bleieves has controlled the heartburn as she does not have this symptom.  Patient reports having chronic headaches. She takes hydrocodone up to 3 times a day. Has been taking this medication for he last few months. She was previously on tramadol, Percocet. She reports that she is moving her bowels every day but states sometimes her stools are watery and sometimes they are hard.  She can have 2-4 Bms per day. She has had this chronically. She does not take anything for this. Sometiems can see some scant amount of blood in her stool when she has to strain. Has tried up to 2 capfuls of Miralax per day but did not feel any improvement.   Upon review of her medical record, patient has presented mild elevation of her aminotransferases since at least 2021 as she had mild elevation of the ALT up to 33 and AST up to 31 with most recent values on 10/17/2021 up to 24 AST and ALT of 26.  Also, her total bili was normal as it was  less than 0.2, alkaline phosphatase was normal at 114, she had normal renal function and electrolytes at that time.  The patient denies having any nausea, vomiting, fever, chills, hematochezia, melena, hematemesis, abdominal distention, jaundice, pruritus . Has lost 4 lb on purpose with diet and medications.Patient reports she has very infrequent episodes of pain in different sites of the abdomen.  Last EGD: never Last Colonoscopy: never  FHx: neg for any gastrointestinal/liver disease, father has metastatic cancer but does not know primary Social: smokes 2 packs a day, neg alcohol or illicit drug use Surgical: D&C in past, tubal ligation and c-section  Past Medical History: Past Medical History:  Diagnosis Date   ADHD    Bipolar I disorder (HCC)    Elevated cholesterol    GERD (gastroesophageal reflux disease)    Thyroid disease    Type 2 diabetes mellitus (HCC)    Vitamin D deficiency     Past Surgical History: Past Surgical History:  Procedure Laterality Date   CESAREAN SECTION     DILATION AND CURETTAGE OF UTERUS      Family History: Family History  Problem Relation Age of Onset   Schizophrenia Father    Alcohol abuse Father    Diabetes Father    Hyperlipidemia Father    Alcohol abuse Paternal Uncle     Social History: Social History   Tobacco Use  Smoking Status Every Day   Packs/day: 2.00   Years: 32.00   Pack years: 64.00   Types: Cigarettes  Smokeless Tobacco Never  Tobacco Comments   smokes 3/4 of a pack a day--01/22/2021   Social History   Substance and Sexual Activity  Alcohol Use No   Alcohol/week: 0.0 standard drinks   Social History   Substance and Sexual Activity  Drug Use No    Allergies: No Known Allergies  Medications: Current Outpatient Medications  Medication Sig Dispense Refill   ACCU-CHEK AVIVA PLUS test strip SMARTSIG:Via Meter     Accu-Chek Softclix Lancets lancets SMARTSIG:Topical     Azelastine-Fluticasone 137-50  MCG/ACT SUSP Place 1 spray into the nose in the morning and at bedtime. One spray each nostril two times daily     budesonide-formoterol (SYMBICORT) 80-4.5 MCG/ACT inhaler Take 2 puffs first thing in am and then another 2 puffs about 12 hours later. 1 each 12   cariprazine (VRAYLAR) 3 MG capsule TAKE (1) CAPSULE BY MOUTH ONCE DAILY. 30 capsule 3   clonazePAM (KLONOPIN) 1 MG tablet Take 1 tablet (1 mg total) by mouth 3 (three) times daily. 90 tablet 2   DULoxetine (CYMBALTA) 60 MG capsule Take 1 capsule (60 mg total) by mouth 2 (two) times daily. 180 capsule 2   esomeprazole (NEXIUM) 40 MG capsule Take 30- 60 min before your first and last meals of the day 60 capsule 2   fenofibrate (TRICOR) 145 MG tablet TAKE 1 TABLET BY MOUTH ONCE DAILY. 90 tablet 2   gabapentin (NEURONTIN) 600 MG tablet Take by mouth.     glipiZIDE (GLUCOTROL) 5 MG tablet Take 2 tablets (10 mg total) by mouth 2 (two) times daily before a meal. 180 tablet 3   HYDROcodone-acetaminophen (NORCO) 10-325 MG tablet Take 1 tablet by mouth. TID     ipratropium (ATROVENT HFA) 17 MCG/ACT inhaler Inhale 2 puffs into the lungs every 6 (six) hours as needed for wheezing. 1 each 11   levocetirizine (XYZAL) 5 MG tablet Take 5 mg by mouth at bedtime.     levothyroxine (SYNTHROID) 75 MCG tablet Take 1 tablet (75 mcg total) by mouth daily. 90 tablet 3   lisdexamfetamine (VYVANSE) 70 MG capsule Take 1 capsule (70 mg total) by mouth daily. 30 capsule 0   NYSTATIN powder Apply topically.     pioglitazone (ACTOS) 30 MG tablet Take 1 tablet (30 mg total) by mouth daily. 90 tablet 3   Suvorexant (BELSOMRA) 20 MG TABS Take 20 mg by mouth at bedtime. 30 tablet 2   tiZANidine (ZANAFLEX) 4 MG tablet Take 4 mg by mouth 2 (two) times daily.     ULTICARE SHORT PEN NEEDLES 31G X 8 MM MISC Use to inject Victoza once daily. 100 each 1   VICTOZA 18 MG/3ML SOPN INJECT 1.2 MG INTO THE SKIN ONE DAILY. 12 mL 3   Vitamin D, Ergocalciferol, (DRISDOL) 1.25 MG (50000  UNIT) CAPS capsule Take 50,000 Units by mouth every 7 (seven) days.     No current facility-administered medications for this visit.    Review of Systems: GENERAL: negative for malaise, night sweats HEENT: No changes in hearing or vision, no nose bleeds or other nasal problems. NECK: Negative for lumps, goiter, pain and significant neck swelling RESPIRATORY: Negative for cough, wheezing CARDIOVASCULAR: Negative for chest pain, leg swelling, palpitations, orthopnea GI: SEE HPI MUSCULOSKELETAL: Negative for joint pain or swelling, back pain, and muscle pain. SKIN: Negative for lesions, rash PSYCH: Negative for sleep disturbance, mood disorder and recent psychosocial stressors. HEMATOLOGY Negative for prolonged bleeding, bruising easily, and swollen nodes. ENDOCRINE: Negative for cold or heat intolerance, polyuria, polydipsia  and goiter. NEURO: negative for tremor, gait imbalance, syncope and seizures. The remainder of the review of systems is noncontributory.   Physical Exam: BP 107/77 (BP Location: Left Arm, Patient Position: Sitting, Cuff Size: Large)    Pulse 76    Temp 98.1 F (36.7 C) (Oral)    Ht 5' 3.5" (1.613 m)    Wt 220 lb 14.4 oz (100.2 kg)    BMI 38.52 kg/m  GENERAL: The patient is AO x3, in no acute distress. Obese. HEENT: Head is normocephalic and atraumatic. EOMI are intact. Mouth is well hydrated and without lesions. NECK: Supple. No masses LUNGS: Clear to auscultation. No presence of rhonchi/wheezing/rales. Adequate chest expansion HEART: RRR, normal s1 and s2. ABDOMEN: Soft, nontender, no guarding, no peritoneal signs, and nondistended. BS +. No masses. EXTREMITIES: Without any cyanosis, clubbing, rash, lesions or edema. NEUROLOGIC: AOx3, no focal motor deficit. SKIN: no jaundice, no rashes   Imaging/Labs: as above  I personally reviewed and interpreted the available labs, imaging and endoscopic files.  Impression and Plan: Catherine Howard is a 45 y.o. female  with past medical history of bipolar disorder, ADHD, hyperlipidemia, GERD, hypothyroidism, diabetes, who presents for evaluation of elevation of her aminotransferases, dysphagia and constipation.  In terms of her elevated aminotransferases, these values have been minimally elevated.  There are multiple causes that could explain these such as the intake of different medications that can cause this mild elevation, such as Cariprazide, suvorexant or duloxetine.  Also, she will have a component of fatty liver given her comorbidities and BMI.  I encouraged her to keep losing weight as this will help with further normalization of her liver enzymes.  We will order liver ultrasound at this moment and check an acute hepatitis panel to rule out viral etiologies.  Nevertheless, I told the patient this elevation was very mild and it would not warrant drastic changes in her current medication as they have been more beneficial for her psychiatric management done harmful for her liver.  Patient understood and agreed with this.  In terms of her constipation, she has had to strain recently more often to move her bowels but has not presented any red flag signs.  As she did not improve with intake of MiraLAX we will start her on Linzess 72 mcg every day for possible IBS-C.  She should continue taking Nexium 40 mg every day as it is controlling her GERD adequately, however she is still presenting chronic episodes of dysphagia.  This will warrant further investigation with an EGD and possible dilation.  Finally, the patient is due for colorectal cancer screening, will schedule a colonoscopy.  -Schedule EGD and colonoscopy -Schedule liver US -Check acute hepatitis panel -Start Linzess 72 mcg qday -Continue working on weight loss -Continue Nexium 40 mg qday  All questions were answered.      Maylon Peppers, MD Gastroenterology and Hepatology [facility]

## 2021-12-19 ENCOUNTER — Ambulatory Visit (HOSPITAL_COMMUNITY): Payer: Medicaid Other

## 2021-12-24 ENCOUNTER — Other Ambulatory Visit (HOSPITAL_COMMUNITY): Payer: Self-pay | Admitting: Psychiatry

## 2021-12-24 ENCOUNTER — Other Ambulatory Visit: Payer: Self-pay | Admitting: Nurse Practitioner

## 2021-12-27 ENCOUNTER — Ambulatory Visit (HOSPITAL_COMMUNITY): Payer: Medicaid Other

## 2022-01-01 ENCOUNTER — Ambulatory Visit: Payer: Medicaid Other | Admitting: Internal Medicine

## 2022-01-02 ENCOUNTER — Ambulatory Visit (HOSPITAL_COMMUNITY): Admit: 2022-01-02 | Payer: Medicaid Other | Admitting: Gastroenterology

## 2022-01-02 ENCOUNTER — Encounter (HOSPITAL_COMMUNITY): Payer: Self-pay

## 2022-01-02 SURGERY — COLONOSCOPY WITH PROPOFOL
Anesthesia: Monitor Anesthesia Care

## 2022-01-23 ENCOUNTER — Other Ambulatory Visit: Payer: Self-pay | Admitting: Nurse Practitioner

## 2022-01-23 ENCOUNTER — Telehealth (INDEPENDENT_AMBULATORY_CARE_PROVIDER_SITE_OTHER): Payer: No Typology Code available for payment source | Admitting: Psychiatry

## 2022-01-23 ENCOUNTER — Other Ambulatory Visit: Payer: Self-pay

## 2022-01-23 ENCOUNTER — Encounter (HOSPITAL_COMMUNITY): Payer: Self-pay

## 2022-01-23 ENCOUNTER — Encounter (HOSPITAL_COMMUNITY): Payer: Self-pay | Admitting: Psychiatry

## 2022-01-23 DIAGNOSIS — F3162 Bipolar disorder, current episode mixed, moderate: Secondary | ICD-10-CM

## 2022-01-23 DIAGNOSIS — F902 Attention-deficit hyperactivity disorder, combined type: Secondary | ICD-10-CM

## 2022-01-23 MED ORDER — DULOXETINE HCL 60 MG PO CPEP: 60.0000 mg | ORAL_CAPSULE | Freq: Two times a day (BID) | ORAL | 2 refills | Status: DC

## 2022-01-23 MED ORDER — CARIPRAZINE HCL 3 MG PO CAPS: ORAL_CAPSULE | ORAL | 3 refills | Status: DC

## 2022-01-23 MED ORDER — LISDEXAMFETAMINE DIMESYLATE 70 MG PO CAPS: 70.0000 mg | ORAL_CAPSULE | Freq: Every day | ORAL | 0 refills | Status: DC

## 2022-01-23 MED ORDER — CLONAZEPAM 1 MG PO TABS: 1.0000 mg | ORAL_TABLET | Freq: Three times a day (TID) | ORAL | 2 refills | Status: DC

## 2022-01-23 MED ORDER — BELSOMRA 20 MG PO TABS: 20.0000 mg | ORAL_TABLET | Freq: Every day | ORAL | 2 refills | Status: DC

## 2022-01-23 NOTE — Progress Notes (Signed)
Virtual Visit via Telephone Note  I connected with Catherine Howard on 01/23/22 at  9:20 AM EST by telephone and verified that I am speaking with the correct person using two identifiers.  Location: Patient: home Provider: office   I discussed the limitations, risks, security and privacy concerns of performing an evaluation and management service by telephone and the availability of in person appointments. I also discussed with the patient that there may be a patient responsible charge related to this service. The patient expressed understanding and agreed to proceed.      I discussed the assessment and treatment plan with the patient. The patient was provided an opportunity to ask questions and all were answered. The patient agreed with the plan and demonstrated an understanding of the instructions.   The patient was advised to call back or seek an in-person evaluation if the symptoms worsen or if the condition fails to improve as anticipated.  I provided 15 minutes of non-face-to-face time during this encounter.   [name], MD  [facility] MD/PA/NP OP Progress Note  01/23/2022 9:38 AM Catherine Howard  MRN:  [id number]  Chief Complaint:  Chief Complaint   Depression; Anxiety; Manic Behavior; ADHD; Follow-up    HPI: This patient is a 46 year old divorced white female who lives with her fianc, 45 year old daughter and 28 year old son in [place]. Her 13 year old son died of a drug overdose in Apr 06, 2013. The patient does not work and is on disability  The patient returns for follow-up after 3 months regarding her depression anxiety bipolar disorder and ADHD.  The patient states overall she has been stable.  She has bronchitis right now but other than that her health has been pretty good.  She is still very worried about her father who is dealing with lung cancer.  She is not very directly involved in her his care right now.  She states that her mood has been stable she denies significant depression  or manic symptoms.  She is sleeping well at night.  The Vyvanse continues to help with her focus.  We had changed recently to clonazepam rather than Xanax and this seems to be helping her anxiety. Visit Diagnosis:    ICD-10-CM   1. Bipolar 1 disorder, mixed, moderate (HCC)  F31.62     2. Attention deficit hyperactivity disorder (ADHD), combined type  F90.2       Past Psychiatric History: Outpatient treatment for the last several years  Past Medical History:  Past Medical History:  Diagnosis Date   ADHD    Bipolar I disorder ([place])    Elevated cholesterol    GERD (gastroesophageal reflux disease)    Thyroid disease    Type 2 diabetes mellitus ([place])    Vitamin D deficiency     Past Surgical History:  Procedure Laterality Date   CESAREAN SECTION     DILATION AND CURETTAGE OF UTERUS      Family Psychiatric History: see below  Family History:  Family History  Problem Relation Age of Onset   Schizophrenia Father    Alcohol abuse Father    Diabetes Father    Hyperlipidemia Father    Alcohol abuse Paternal Uncle     Social History:  Social History   Socioeconomic History   Marital status: Single    Spouse name: Not on file   Number of children: Not on file   Years of education: Not on file   Highest education level: Not on file  Occupational History   Not on file  Tobacco Use   Smoking status: Every Day    Packs/day: 2.00    Years: 32.00    Pack years: 64.00    Types: Cigarettes   Smokeless tobacco: Never   Tobacco comments:    smokes 3/4 of a pack a day--01/22/2021  Vaping Use   Vaping Use: Former  Substance and Sexual Activity   Alcohol use: No    Alcohol/week: 0.0 standard drinks   Drug use: No   Sexual activity: Yes    Partners: Male    Birth control/protection: None  Other Topics Concern   Not on file  Social History Narrative   Not on file   Social Determinants of Health   Financial Resource Strain: Not on file  Food Insecurity: Not on file   Transportation Needs: Not on file  Physical Activity: Not on file  Stress: Not on file  Social Connections: Not on file    Allergies: No Known Allergies  Metabolic Disorder Labs: Lab Results  Component Value Date   HGBA1C 6.5 08/08/2021   No results found for: PROLACTIN Lab Results  Component Value Date   CHOL 221 (H) 10/17/2021   TRIG 329 (H) 10/17/2021   HDL 39 (L) 10/17/2021   CHOLHDL 5.7 (H) 10/17/2021   Howard 124 (H) 10/17/2021   [place] 124 10/17/2021   Lab Results  Component Value Date   TSH 0.575 10/17/2021   TSH 0.58 10/17/2021    Therapeutic Level Labs: Lab Results  Component Value Date   LITHIUM 0.40 (L) 07/14/2014   No results found for: VALPROATE No components found for:  CBMZ  Current Medications: Current Outpatient Medications  Medication Sig Dispense Refill   lisdexamfetamine (VYVANSE) 70 MG capsule Take 1 capsule (70 mg total) by mouth daily. 30 capsule 0   lisdexamfetamine (VYVANSE) 70 MG capsule Take 1 capsule (70 mg total) by mouth daily. 30 capsule 0   ACCU-CHEK AVIVA PLUS test strip SMARTSIG:Via Meter     Accu-Chek Softclix Lancets lancets SMARTSIG:Topical     Azelastine-Fluticasone 137-50 MCG/ACT SUSP Place 1 spray into the nose in the morning and at bedtime. One spray each nostril two times daily     budesonide-formoterol (SYMBICORT) 80-4.5 MCG/ACT inhaler Take 2 puffs first thing in am and then another 2 puffs about 12 hours later. 1 each 12   cariprazine (VRAYLAR) 3 MG capsule TAKE (1) CAPSULE BY MOUTH ONCE DAILY. 30 capsule 3   clonazePAM (KLONOPIN) 1 MG tablet Take 1 tablet (1 mg total) by mouth 3 (three) times daily. 90 tablet 2   DULoxetine (CYMBALTA) 60 MG capsule Take 1 capsule (60 mg total) by mouth 2 (two) times daily. 180 capsule 2   esomeprazole (NEXIUM) 40 MG capsule Take 30- 60 min before your first and last meals of the day 60 capsule 2   fenofibrate (TRICOR) 145 MG tablet TAKE 1 TABLET BY MOUTH ONCE DAILY. 90 tablet 2    gabapentin (NEURONTIN) 600 MG tablet Take by mouth.     glipiZIDE (GLUCOTROL) 5 MG tablet Take 2 tablets (10 mg total) by mouth 2 (two) times daily before a meal. 180 tablet 3   HYDROcodone-acetaminophen (NORCO) 10-325 MG tablet Take 1 tablet by mouth. TID     ipratropium (ATROVENT HFA) 17 MCG/ACT inhaler Inhale 2 puffs into the lungs every 6 (six) hours as needed for wheezing. 1 each 11   levocetirizine (XYZAL) 5 MG tablet Take 5 mg by mouth at bedtime.     levothyroxine (SYNTHROID) 75 MCG tablet Take  1 tablet (75 mcg total) by mouth daily. 90 tablet 3   linaclotide (LINZESS) 72 MCG capsule Take 1 capsule (72 mcg total) by mouth daily before breakfast. 90 capsule 1   lisdexamfetamine (VYVANSE) 70 MG capsule Take 1 capsule (70 mg total) by mouth daily. 30 capsule 0   NOVOFINE PEN NEEDLE 32G X 6 MM MISC USE AS DIRECTED 100 each 0   NYSTATIN powder Apply topically.     pioglitazone (ACTOS) 30 MG tablet Take 1 tablet (30 mg total) by mouth daily. 90 tablet 3   polyethylene glycol-electrolytes (TRILYTE) 420 g solution Take 4,000 mLs by mouth as directed. 4000 mL 0   Suvorexant (BELSOMRA) 20 MG TABS Take 20 mg by mouth at bedtime. 30 tablet 2   tiZANidine (ZANAFLEX) 4 MG tablet Take 4 mg by mouth 2 (two) times daily.     VICTOZA 18 MG/3ML SOPN INJECT 1.2 MG INTO THE SKIN ONE DAILY. 12 mL 3   Vitamin D, Ergocalciferol, (DRISDOL) 1.25 MG (50000 UNIT) CAPS capsule Take 50,000 Units by mouth every 7 (seven) days.     No current facility-administered medications for this visit.     Musculoskeletal: Strength & Muscle Tone: na Gait & Station: na Patient leans: N/A  Psychiatric Specialty Exam: Review of Systems  HENT:  Positive for congestion.   Respiratory:  Positive for cough.   All other systems reviewed and are negative.  There were no vitals taken for this visit.There is no height or weight on file to calculate BMI.  General Appearance: NA  Eye Contact:  NA  Speech:  Clear and Coherent   Volume:  Normal  Mood:  Euthymic  Affect:  Appropriate and Congruent  Thought Process:  Goal Directed  Orientation:  Full (Time, Place, and Person)  Thought Content: WDL   Suicidal Thoughts:  No  Homicidal Thoughts:  No  Memory:  Immediate;   Good Recent;   Good Remote;   Good  Judgement:  Good  Insight:  Fair  Psychomotor Activity:  Normal  Concentration:  Concentration: Good and Attention Span: Good  Recall:  Good  Fund of Knowledge: Good  Language: Good  Akathisia:  No  Handed:  Right  AIMS (if indicated): not done  Assets:  Communication Skills Desire for Improvement Physical Health Resilience Social Support Talents/Skills  ADL's:  Intact  Cognition: WNL  Sleep:  Good   Screenings: PHQ2-9    Flowsheet Row Video Visit from 01/23/2022 in [facility] Video Visit from 10/23/2021 in [facility] Video Visit from 07/27/2021 in [facility]  PHQ-2 Total Score 1 1 1       Flowsheet Row Video Visit from 01/23/2022 in [facility] Video Visit from 10/23/2021 in [facility] Video Visit from 07/27/2021 in [facility] No Risk No Risk No Risk        Assessment and Plan: This patient is a 46 year old female with a history of bipolar disorder anxiety and ADHD.  She is doing well on her current regimen.  She will continue Belsomra 20 mg at bedtime for sleep, clonazepam 1 mg 3 times daily for anxiety, Cymbalta 60 mg twice daily for depression, Vraylar 3 mg daily for mood stabilization and Vyvanse 70 mg each morning for ADHD.  She will return to see me in 36-months   [name], MD 01/23/2022, 9:38 AM

## 2022-01-24 LAB — HEPATITIS PANEL, ACUTE
Hep A IgM: NONREACTIVE
Hep B C IgM: NONREACTIVE
Hepatitis B Surface Ag: NONREACTIVE
Hepatitis C Ab: NONREACTIVE
SIGNAL TO CUT-OFF: 0.04 (ref <1.00)

## 2022-01-30 ENCOUNTER — Telehealth: Payer: Self-pay | Admitting: Nurse Practitioner

## 2022-01-30 DIAGNOSIS — E039 Hypothyroidism, unspecified: Secondary | ICD-10-CM

## 2022-01-30 NOTE — Telephone Encounter (Signed)
Patient said she still does not have any energy. She has been taking the 50,000 units of the Vitamin D and tried OTC .Marland Kitchen

## 2022-01-30 NOTE — Telephone Encounter (Signed)
Lets go ahead and recheck her thyroid function.  I will order some labs for her to have done.

## 2022-01-30 NOTE — Telephone Encounter (Signed)
Patient made aware and mailed lab orders

## 2022-02-05 ENCOUNTER — Other Ambulatory Visit (HOSPITAL_COMMUNITY): Payer: Self-pay | Admitting: Psychiatry

## 2022-02-05 ENCOUNTER — Other Ambulatory Visit: Payer: Self-pay | Admitting: Nurse Practitioner

## 2022-02-05 ENCOUNTER — Telehealth (HOSPITAL_COMMUNITY): Payer: Self-pay | Admitting: *Deleted

## 2022-02-05 DIAGNOSIS — E119 Type 2 diabetes mellitus without complications: Secondary | ICD-10-CM

## 2022-02-05 DIAGNOSIS — E039 Hypothyroidism, unspecified: Secondary | ICD-10-CM

## 2022-02-05 MED ORDER — TRAZODONE HCL 100 MG PO TABS: 100.0000 mg | ORAL_TABLET | Freq: Every day | ORAL | 2 refills | Status: DC

## 2022-02-05 NOTE — Telephone Encounter (Signed)
Tell her trazodone 100 mg sent in

## 2022-02-05 NOTE — Telephone Encounter (Signed)
Patient called and stated the Belsomra is not working. Per pt she started this medication Dec 2022. Per pt she's been staying awake. Per pt she would like to try something else.

## 2022-02-05 NOTE — Telephone Encounter (Signed)
Informed patient and she verbalized understanding.  

## 2022-02-05 NOTE — Telephone Encounter (Signed)
Pt called and updated her insurance information and wanted her lab order replaced. Mailed new lab orders.

## 2022-02-15 ENCOUNTER — Telehealth: Payer: Self-pay | Admitting: Nurse Practitioner

## 2022-02-15 MED ORDER — VICTOZA 18 MG/3ML ~~LOC~~ SOPN: 1.8000 mg | PEN_INJECTOR | Freq: Every day | SUBCUTANEOUS | 3 refills | Status: DC

## 2022-02-15 NOTE — Telephone Encounter (Signed)
Called patient and gave her the message. Patient stated that she is eating better but still not eating as well as she should be. Patient will work on her diet and exercise. Patient verbalized an understanding.

## 2022-02-15 NOTE — Telephone Encounter (Signed)
Pt is taking her victoza. She said she has gained back some weight & she needs to know what you would like to do about increasing her Victoza. Please call pt

## 2022-02-15 NOTE — Telephone Encounter (Signed)
I increased her Victoza to 1.8 mg SQ daily.  I am hesitant to go much higher due to her heavy smoking history which would increase her risk of pancreatitis.  Reinforce healthy diet, limiting sugary beverages and processed foods and incorporating more exercise into her daily routine which will help with weight loss.

## 2022-02-18 ENCOUNTER — Telehealth (HOSPITAL_COMMUNITY): Payer: Self-pay | Admitting: *Deleted

## 2022-02-18 NOTE — Telephone Encounter (Signed)
noted 

## 2022-02-18 NOTE — Telephone Encounter (Signed)
Patient called and stated that she never stopped taking the [place]. Per pt she did not pick up the Trazodone due to cost. So she kept taking the Belsomra. Per pt she thinks she just needed to take the [place] a bit longer because it's actually working. Per pt she just wanted to let provider know and get an update about her sleeping medication

## 2022-02-19 ENCOUNTER — Ambulatory Visit: Payer: Medicaid Other | Admitting: Nurse Practitioner

## 2022-02-21 ENCOUNTER — Other Ambulatory Visit: Payer: Self-pay | Admitting: Nurse Practitioner

## 2022-02-21 DIAGNOSIS — E119 Type 2 diabetes mellitus without complications: Secondary | ICD-10-CM

## 2022-02-27 ENCOUNTER — Other Ambulatory Visit: Payer: Self-pay | Admitting: Nurse Practitioner

## 2022-02-27 DIAGNOSIS — E559 Vitamin D deficiency, unspecified: Secondary | ICD-10-CM

## 2022-02-28 ENCOUNTER — Other Ambulatory Visit: Payer: Self-pay

## 2022-02-28 ENCOUNTER — Encounter: Payer: Self-pay | Admitting: Nurse Practitioner

## 2022-02-28 ENCOUNTER — Ambulatory Visit (INDEPENDENT_AMBULATORY_CARE_PROVIDER_SITE_OTHER): Payer: Medicaid Other | Admitting: Nurse Practitioner

## 2022-02-28 VITALS — BP 122/79 | HR 106 | Ht 63.5 in | Wt 220.0 lb

## 2022-02-28 DIAGNOSIS — E119 Type 2 diabetes mellitus without complications: Secondary | ICD-10-CM | POA: Diagnosis not present

## 2022-02-28 DIAGNOSIS — E782 Mixed hyperlipidemia: Secondary | ICD-10-CM | POA: Diagnosis not present

## 2022-02-28 DIAGNOSIS — E559 Vitamin D deficiency, unspecified: Secondary | ICD-10-CM

## 2022-02-28 DIAGNOSIS — E039 Hypothyroidism, unspecified: Secondary | ICD-10-CM | POA: Diagnosis not present

## 2022-02-28 DIAGNOSIS — F17200 Nicotine dependence, unspecified, uncomplicated: Secondary | ICD-10-CM

## 2022-02-28 LAB — VITAMIN D 25 HYDROXY (VIT D DEFICIENCY, FRACTURES): Vit D, 25-Hydroxy: 30 ng/mL (ref 30.0–100.0)

## 2022-02-28 LAB — COMPREHENSIVE METABOLIC PANEL
ALT: 21 IU/L (ref 0–32)
AST: 19 IU/L (ref 0–40)
Albumin/Globulin Ratio: 1.8 (ref 1.2–2.2)
Albumin: 4.3 g/dL (ref 3.8–4.8)
Alkaline Phosphatase: 112 IU/L (ref 44–121)
BUN/Creatinine Ratio: 14 (ref 9–23)
BUN: 10 mg/dL (ref 6–24)
Bilirubin Total: <0.2 mg/dL (ref 0.0–1.2)
CO2: 23 mmol/L (ref 20–29)
Calcium: 9.7 mg/dL (ref 8.7–10.2)
Chloride: 104 mmol/L (ref 96–106)
Creatinine, Ser: 0.74 mg/dL (ref 0.57–1.00)
Globulin, Total: 2.4 g/dL (ref 1.5–4.5)
Glucose: 85 mg/dL (ref 70–99)
Potassium: 4.4 mmol/L (ref 3.5–5.2)
Sodium: 142 mmol/L (ref 134–144)
Total Protein: 6.7 g/dL (ref 6.0–8.5)
eGFR: 100 mL/min/{1.73_m2} (ref >59)

## 2022-02-28 LAB — POCT GLYCOSYLATED HEMOGLOBIN (HGB A1C): HbA1c, POC (controlled diabetic range): 6.1 % (ref 0.0–7.0)

## 2022-02-28 LAB — T4, FREE: Free T4: 1.53 ng/dL (ref 0.82–1.77)

## 2022-02-28 LAB — TSH: TSH: 1.67 u[IU]/mL (ref 0.450–4.500)

## 2022-02-28 MED ORDER — PIOGLITAZONE HCL 30 MG PO TABS: 30.0000 mg | ORAL_TABLET | Freq: Every day | ORAL | 3 refills | Status: DC

## 2022-02-28 MED ORDER — GLIPIZIDE 5 MG PO TABS: 5.0000 mg | ORAL_TABLET | Freq: Two times a day (BID) | ORAL | 3 refills | Status: DC

## 2022-02-28 MED ORDER — NOVOFINE PEN NEEDLE 32G X 6 MM MISC
3 refills | Status: DC
Start: 2022-02-28 — End: 2022-10-28

## 2022-02-28 MED ORDER — VICTOZA 18 MG/3ML ~~LOC~~ SOPN: 1.8000 mg | PEN_INJECTOR | Freq: Every day | SUBCUTANEOUS | 3 refills | Status: DC

## 2022-02-28 NOTE — Progress Notes (Signed)
02/28/2022, 8:39 AM Endocrinology Follow Up Visit   Subjective:    Patient ID: Catherine Howard, female    DOB: 04-06-1976.  Catherine Howard is being seen in follow up for management of currently uncontrolled symptomatic diabetes requested by  Vidal Schwalbe, MD.   Past Medical History:  Diagnosis Date   ADHD    Bipolar I disorder ([place])    Elevated cholesterol    GERD (gastroesophageal reflux disease)    Thyroid disease    Type 2 diabetes mellitus ([facility])    Vitamin D deficiency     Past Surgical History:  Procedure Laterality Date   CESAREAN SECTION     DILATION AND CURETTAGE OF UTERUS      Social History   Socioeconomic History   Marital status: Single    Spouse name: Not on file   Number of children: Not on file   Years of education: Not on file   Highest education level: Not on file  Occupational History   Not on file  Tobacco Use   Smoking status: Every Day    Packs/day: 2.00    Years: 32.00    Pack years: 64.00    Types: Cigarettes   Smokeless tobacco: Never   Tobacco comments:    smokes 3/4 of a pack a day--01/22/2021  Vaping Use   Vaping Use: Former  Substance and Sexual Activity   Alcohol use: No    Alcohol/week: 0.0 standard drinks   Drug use: No   Sexual activity: Yes    Partners: Male    Birth control/protection: None  Other Topics Concern   Not on file  Social History Narrative   Not on file   Social Determinants of Health   Financial Resource Strain: Not on file  Food Insecurity: Not on file  Transportation Needs: Not on file  Physical Activity: Not on file  Stress: Not on file  Social Connections: Not on file    Family History  Problem Relation Age of Onset   Schizophrenia Father    Alcohol abuse Father    Diabetes Father    Hyperlipidemia Father    Alcohol abuse Paternal Uncle     Outpatient Encounter Medications as of 02/28/2022  Medication  Sig   ACCU-CHEK AVIVA PLUS test strip SMARTSIG:Via Meter   Accu-Chek Softclix Lancets lancets SMARTSIG:Topical   Azelastine-Fluticasone 137-50 MCG/ACT SUSP Place 1 spray into the nose in the morning and at bedtime. One spray each nostril two times daily   BELSOMRA 20 MG TABS Take 1 tablet by mouth at bedtime as needed.   budesonide-formoterol (SYMBICORT) 80-4.5 MCG/ACT inhaler Take 2 puffs first thing in am and then another 2 puffs about 12 hours later.   butalbital-acetaminophen-caffeine (FIORICET) 50-325-40 MG tablet Take by mouth.   cariprazine (VRAYLAR) 3 MG capsule TAKE (1) CAPSULE BY MOUTH ONCE DAILY.   chlorpheniramine-HYDROcodone 10-8 MG/5ML Take by mouth.   clonazePAM (KLONOPIN) 1 MG tablet Take 1 tablet (1 mg total) by mouth 3 (three) times daily.   DULoxetine (CYMBALTA) 60 MG capsule Take 1 capsule (60 mg total) by mouth 2 (two) times daily.  esomeprazole (NEXIUM) 40 MG capsule Take 30- 60 min before your first and last meals of the day   fenofibrate (TRICOR) 145 MG tablet TAKE 1 TABLET BY MOUTH ONCE DAILY.   HYDROcodone-acetaminophen (NORCO) 10-325 MG tablet Take 1 tablet by mouth. TID   ipratropium (ATROVENT HFA) 17 MCG/ACT inhaler Inhale 2 puffs into the lungs every 6 (six) hours as needed for wheezing.   levocetirizine (XYZAL) 5 MG tablet Take 5 mg by mouth at bedtime.   levocetirizine (XYZAL) 5 MG tablet 1 tablet in the evening   levothyroxine (SYNTHROID) 75 MCG tablet Take 1 tablet (75 mcg total) by mouth daily.   linaclotide (LINZESS) 72 MCG capsule Take 1 capsule (72 mcg total) by mouth daily before breakfast.   lisdexamfetamine (VYVANSE) 70 MG capsule Take 1 capsule (70 mg total) by mouth daily.   lisdexamfetamine (VYVANSE) 70 MG capsule Take 1 capsule (70 mg total) by mouth daily.   lisdexamfetamine (VYVANSE) 70 MG capsule Take 1 capsule (70 mg total) by mouth daily.   NYSTATIN powder Apply topically.   polyethylene glycol-electrolytes (TRILYTE) 420 g solution Take 4,000  mLs by mouth as directed.   tiZANidine (ZANAFLEX) 4 MG tablet Take 4 mg by mouth 2 (two) times daily.   TRELEGY ELLIPTA 100-62.5-25 MCG/ACT AEPB Inhale 1 puff into the lungs daily.   VENTOLIN HFA 108 (90 Base) MCG/ACT inhaler Inhale 2 puffs into the lungs every 4 (four) hours as needed.   Vitamin D, Ergocalciferol, (DRISDOL) 1.25 MG (50000 UNIT) CAPS capsule Take 50,000 Units by mouth every 7 (seven) days.   [DISCONTINUED] glipiZIDE (GLUCOTROL) 5 MG tablet TAKE 2 TABLET BY MOUTH TWICE DAILY BEFORE (BREAKFAST & SUPPER).   [DISCONTINUED] liraglutide (VICTOZA) 18 MG/3ML SOPN Inject 1.8 mg into the skin daily.   [DISCONTINUED] NOVOFINE PEN NEEDLE 32G X 6 MM MISC USE AS DIRECTED   [DISCONTINUED] pioglitazone (ACTOS) 30 MG tablet Take 1 tablet (30 mg total) by mouth daily.   glipiZIDE (GLUCOTROL) 5 MG tablet Take 1 tablet (5 mg total) by mouth 2 (two) times daily before a meal.   liraglutide (VICTOZA) 18 MG/3ML SOPN Inject 1.8 mg into the skin daily.   NOVOFINE PEN NEEDLE 32G X 6 MM MISC Use to inject Victoza once daily   pioglitazone (ACTOS) 30 MG tablet Take 1 tablet (30 mg total) by mouth daily.   [DISCONTINUED] gabapentin (NEURONTIN) 600 MG tablet Take by mouth.   [DISCONTINUED] traZODone (DESYREL) 100 MG tablet Take 1 tablet (100 mg total) by mouth at bedtime.   No facility-administered encounter medications on file as of 02/28/2022.    ALLERGIES: No Known Allergies  VACCINATION STATUS: Immunization History  Administered Date(s) Administered   Tdap 01/28/2017    Diabetes She presents for her follow-up diabetic visit. She has type 2 diabetes mellitus. Onset time: She was diagnosed at approximate age of 72 years. Her disease course has been improving. Hypoglycemia symptoms include sweats and tremors. Pertinent negatives for hypoglycemia include no confusion, headaches, nervousness/anxiousness, pallor or seizures. Associated symptoms include fatigue. Pertinent negatives for diabetes include no  chest pain, no foot ulcerations, no polydipsia, no polyphagia, no polyuria, no visual change and no weight loss. There are no hypoglycemic complications. Symptoms are improving. There are no diabetic complications. Risk factors for coronary artery disease include diabetes mellitus, dyslipidemia, obesity, sedentary lifestyle, tobacco exposure and hypertension. Current diabetic treatment includes oral agent (dual therapy) (and Victoza). She is compliant with treatment most of the time. Her weight is stable. She is following a  generally unhealthy diet. When asked about meal planning, she reported none. She has not had a previous visit with a dietitian. She rarely participates in exercise. Her home blood glucose trend is decreasing steadily. Her breakfast blood glucose range is generally 70-90 mg/dl. Her bedtime blood glucose range is generally 130-140 mg/dl. (She presents today with her logs, no meter, showing tightening glycemic profile overall.  Her POCT A1c today is 6.1%, improving from last visit of 6.5%.  She reports significant social stressors, has MIL who is on hospice and dad isn't doing well either.  ) An ACE inhibitor/angiotensin II receptor blocker is not being taken. She does not see a podiatrist.Eye exam is current.  Hyperlipidemia This is a chronic problem. The current episode started more than 1 year ago. The problem is uncontrolled (yet improving). Recent lipid tests were reviewed and are high. Exacerbating diseases include diabetes, hypothyroidism and obesity. Factors aggravating her hyperlipidemia include fatty foods and smoking. Pertinent negatives include no chest pain, myalgias or shortness of breath. Current antihyperlipidemic treatment includes statins and fibric acid derivatives. The current treatment provides moderate improvement of lipids. Compliance problems include adherence to diet and adherence to exercise.  Risk factors for coronary artery disease include diabetes mellitus,  dyslipidemia, a sedentary lifestyle, obesity and hypertension.    Review of systems  Constitutional: + Minimally fluctuating body weight,  current Body mass index is 38.36 kg/m. , + fatigue, no subjective hyperthermia, no subjective hypothermia Eyes: no blurry vision, no xerophthalmia ENT: no sore throat, no nodules palpated in throat, no dysphagia/odynophagia, no hoarseness Cardiovascular: no chest pain, no shortness of breath, no palpitations, no leg swelling Respiratory: no cough, no shortness of breath Gastrointestinal: no nausea/vomiting/diarrhea Musculoskeletal: no muscle/joint aches Skin: no rashes, no hyperemia Neurological: no tremors, no numbness, no tingling, no dizziness Psychiatric: no depression, no anxiety, hx bipolar- currently controlled on meds  Objective:     BP 122/79    Pulse (!) 106    Ht 5' 3.5" (1.613 m)    Wt 220 lb (99.8 kg)    SpO2 100%    BMI 38.36 kg/m   Wt Readings from Last 3 Encounters:  02/28/22 220 lb (99.8 kg)  12/06/21 220 lb 14.4 oz (100.2 kg)  11/16/21 221 lb 0.6 oz (100.3 kg)    BP Readings from Last 3 Encounters:  02/28/22 122/79  12/06/21 107/77  11/16/21 132/78     Physical Exam- Limited  Constitutional:  Body mass index is 38.36 kg/m. , not in acute distress, normal state of mind Eyes:  EOMI, no exophthalmos Neck: Supple Cardiovascular: RRR, no murmurs, rubs, or gallops, no edema Respiratory: Adequate breathing efforts, no crackles, rales, rhonchi, or wheezing Musculoskeletal: no gross deformities, strength intact in all four extremities, no gross restriction of joint movements Skin:  no rashes, no hyperemia Neurological: no tremor with outstretched hands    CMP ( most recent) CMP     Component Value Date/Time   NA 142 02/27/2022 0839   K 4.4 02/27/2022 0839   CL 104 02/27/2022 0839   CO2 23 02/27/2022 0839   GLUCOSE 85 02/27/2022 0839   GLUCOSE 89 07/14/2014 0958   BUN 10 02/27/2022 0839   CREATININE 0.74  02/27/2022 0839   CREATININE 0.75 07/14/2014 0958   CALCIUM 9.7 02/27/2022 0839   PROT 6.7 02/27/2022 0839   ALBUMIN 4.3 02/27/2022 0839   AST 19 02/27/2022 0839   ALT 21 02/27/2022 0839   ALKPHOS 112 02/27/2022 0839   BILITOT <0.2 02/27/2022 [id number]  GFRNONAA 80 10/10/2020 0000     Diabetic Labs (most recent): Lab Results  Component Value Date   HGBA1C 6.1 02/28/2022   HGBA1C 6.5 08/08/2021   HGBA1C 6.7 (A) 06/19/2021     Lipid Panel     Component Value Date/Time   CHOL 221 (H) 10/17/2021 1008   TRIG 329 (H) 10/17/2021 1008   HDL 39 (L) 10/17/2021 1008   CHOLHDL 5.7 (H) 10/17/2021 1008   LDLCALC 124 (H) 10/17/2021 1008   LABVLDL 58 (H) 10/17/2021 1008      Assessment & Plan:   1) Type 2 diabetes mellitus without complication, without long-term current use of insulin (HCC)  - Catherine Howard has currently uncontrolled symptomatic type 2 DM since  46 years of age.  She presents today with her logs, no meter, showing tightening glycemic profile overall.  Her POCT A1c today is 6.1%, improving from last visit of 6.5%.  She reports significant social stressors, has MIL who is on hospice and dad isn't doing well either.    - I had a long discussion with her about the progressive nature of diabetes and the pathology behind its complications. -her diabetes is complicated by obesity/sedentary life, chronic  Smoking, sleep apnea and she remains at a high risk for more acute and chronic complications which include CAD, CVA, CKD, retinopathy, and neuropathy. These are all discussed in detail with her.  - Nutritional counseling repeated at each appointment due to patients tendency to fall back in to old habits.  - The patient admits there is a room for improvement in their diet and drink choices. -  Suggestion is made for the patient to avoid simple carbohydrates from their diet including Cakes, Sweet Desserts / Pastries, Ice Cream, Soda (diet and regular), Sweet Tea, Candies, Chips,  Cookies, Sweet Pastries, Store Bought Juices, Alcohol in Excess of 1-2 drinks a day, Artificial Sweeteners, Coffee Creamer, and "Sugar-free" Products. This will help patient to have stable blood glucose profile and potentially avoid unintended weight gain.   - I encouraged the patient to switch to unprocessed or minimally processed complex starch and increased protein intake (animal or plant source), fruits, and vegetables.   - Patient is advised to stick to a routine mealtimes to eat 3 meals a day and avoid unnecessary snacks (to snack only to correct hypoglycemia).  -She agrees to referral to [facility] for assistance with glucose management and weight loss.  - I have approached her with the following individualized plan to manage  her diabetes and patient agrees:   -Given her tightening glycemic profile, will reduce her Glipizide to 5 mg po twice daily with meals.  She can continue her Actos 30 mg po daily and Victoza 1.8 mg SQ daily.   -She is advised to continue monitoring glucose at least twice daily, before breakfast and before bed, and call the clinic if she has readings less than 70 or greater than 300 for 3 tests in a row.  - Specific targets for  A1c;  LDL, HDL,  and Triglycerides were discussed with the patient.  2) Blood Pressure /Hypertension: Her blood pressure is controlled to target.  She is not currently on any antihypertensive medications at this time.  If BP is elevated on 3 separate occasions, she will be considered for low dose ACE/ARB for renal protection from DM.  3) Lipids/Hyperlipidemia:  Her most recent lipid panel from 10/17/21 shows uncontrolled LDL of 124 and elevated triglycerides of 329.  She is advised to continue  Crestor 20 mg po daily at bedtime and Fenofibrate 145 mg po daily.  Side effects and precautions discussed with her.  She is also advised to avoid fried foods and butter.  4)  Weight/Diet:  Her Body mass index is 38.36 kg/m.-   clearly  complicating her diabetes care.   she is  a candidate for weight loss. I discussed with her the fact that loss of 5 - 10% of her  current body weight will have the most impact on her diabetes management.  Exercise, and detailed carbohydrates information provided  -  detailed on discharge instructions.  Have set up dietician appt to discuss weight loss.  5) Hypothyroidism- unspecified Her previsit thyroid function tests are consistent with appropriate hormone replacement.  She is advised to continue her Levothyroxine 75 mcg po daily before breakfast.  - The correct intake of thyroid hormone (Levothyroxine, Synthroid), is on empty stomach first thing in the morning, with water, separated by at least 30 minutes from breakfast and other medications,  and separated by more than 4 hours from calcium, iron, multivitamins, acid reflux medications (PPIs).  - This medication is a life-long medication and will be needed to correct thyroid hormone imbalances for the rest of your life.  The dose may change from time to time, based on thyroid blood work.  - It is extremely important to be consistent taking this medication, near the same time each morning.  -AVOID TAKING PRODUCTS CONTAINING BIOTIN (commonly found in Hair, Skin, Nails vitamins) AS IT INTERFERES WITH THE VALIDITY OF THYROID FUNCTION BLOOD TESTS.  6) Vitamin D Deficiency Her most recent vitamin D level was 30 on 13/8/23.  She is currently taking Ergocalciferol 50000 units weekly, she is advised to continue.  7) Chronic Care/Health Maintenance: -she is on Statin medications and  is encouraged to initiate and continue to follow up with Ophthalmology, Dentist, Podiatrist at least yearly or according to recommendations, and advised to  Quit smoking. I have recommended yearly flu vaccine and pneumonia vaccine at least every 5 years; moderate intensity exercise for up to 150 minutes weekly; and  sleep for at least 7 hours a day.  Smoking cessation  instruction/counseling given:  counseled patient on the dangers of tobacco use, advised patient to stop smoking, and reviewed strategies to maximize success  - she is advised to maintain close follow up with Vidal Schwalbe, MD for primary care needs, as well as her other providers for optimal and coordinated care.     I spent 30 minutes in the care of the patient today including review of labs from [place], Lipids, Thyroid Function, Hematology (current and previous including abstractions from other facilities); face-to-face time discussing  her blood glucose readings/logs, discussing hypoglycemia and hyperglycemia episodes and symptoms, medications doses, her options of short and long term treatment based on the latest standards of care / guidelines;  discussion about incorporating lifestyle medicine;  and documenting the encounter.    Please refer to Patient Instructions for Blood Glucose Monitoring and Insulin/Medications Dosing Guide"  in media tab for additional information. Please  also refer to " Patient Self Inventory" in the Media  tab for reviewed elements of pertinent patient history.  Catherine Howard participated in the discussions, expressed understanding, and voiced agreement with the above plans.  All questions were answered to her satisfaction. she is encouraged to contact clinic should she have any questions or concerns prior to her return visit.    Follow up plan: - Return in about 4 months (  around 06/30/2022) for Diabetes F/U- A1c and UM in office, No previsit labs, Bring meter and logs.  Catherine Howard, [facility] [address] [place], [place] [address] Phone: [phone] Fax: [phone]  02/28/2022, 8:39 AM

## 2022-02-28 NOTE — Patient Instructions (Signed)

## 2022-03-08 ENCOUNTER — Ambulatory Visit: Payer: Medicaid Other | Admitting: Internal Medicine

## 2022-03-25 NOTE — Progress Notes (Deleted)
? ?Catherine Howard, female    DOB: 1976-08-16   MRN: [id number] ? ? ?Brief patient profile:  ?24  yowf smoker with brother with Down Syndrome with last IUP > wt baseline then 196 in 2008 with onset around 2019 sob/cough with nl pfts 07/2018 x for low ERV rx chronically with Trelegy and prn atrovent and referred to pulmonary clinic in [place]  06/21/2020 by Catherine Howard ? ? ? ? ?History of Present Illness  ?06/21/2020  Pulmonary/ 1st office eval/Catherine Howard  No vacicination / no trelegy on day of ov ?Chief Complaint  ?Patient presents with  ? Pulmonary Consult  ?  Referred by Catherine. Vidal Howard. Former Catherine Catherine Howard pt. She states has hx of asthma and COPD. Breathing is "so so today". She c/o cough and feeling of mucus stuck in her throat. She is using atrovent schedued qid.   ?Dyspnea: does flat surface ok all days, it's the hills and steps = doe  then use atrovent, never prechallenges ?MMRC1 = can walk nl pace, flat grade, can't hurry or go uphills or steps s sob   ?Cough: sporadic but does not wake her up/ something stuck in throat daytime but no mucus ?Sleep: doesn't bother her sleep and doesn't wake her up x months ?SABA use: says atrovent helps  ?Rec ?Stop the trelegy now  ?Try Atrovent 30  min before an activity that you know would make you short of breath  ?Continue nexium Take 30- 60 min before your first and last meals of the day  ?GERD diet reviewed, bed blocks rec  ?I strongly recommend the moderna or pfizer as soon as you can based on proven safety and effectiveness in over 150 million [place] ?The key is to stop smoking completely before smoking completely stops you! ? ? ?11/16/2021  f/u ov/Catherine Howard office/Catherine Howard re: GOLD 0 copd maint on trelegy and atovent hfa prn but technique very poor   ?Chief Complaint  ?Patient presents with  ? Follow-up  ?  Persistent dry cough. Feels she has some breathing struggles but thinks may be related to anxiety.   ?Dyspnea:  MMRC1 = can walk nl pace, flat grade, can't hurry or go  uphills or steps s sob   ?Walks to [place] and back can take up to 30 min flat grade  ?Cough: wakes in am with it/ feels sensation of globus / gagging but dry  ?Sleeping: most nights wakes her up bed is flat  ?SABA use: using atrovent once a day seem sto help  ?02: none  ?Covid status: never vax  ?Rec ?Plan A = Automatic = Always=    symbicort 80 Take 2 puffs first thing in am and then another 2 puffs about 12 hours later.  ?Work on inhaler technique:  ? Changed nexium 40 mg Take 30- 60 min before your first and last meals of the day  ?Plan B = Backup (to supplement plan A, not to replace it) ?Only use your albuterol inhaler as a rescue medication  ?GERD diet reviewed, bed blocks rec  ?Please schedule a follow up office visit in 6 weeks, ? ? ?03/26/2022  f/u ov/[name] office/Catherine Howard re: *** maint on ***  ?No chief complaint on file. ?  ?Dyspnea:  *** ?Cough: *** ?Sleeping: *** ?SABA use: *** ?02: *** ?Covid status: *** ?Lung cancer screening: *** ? ? ?No obvious day to day or daytime variability or assoc excess/ purulent sputum or mucus plugs or hemoptysis or cp or chest tightness, subjective wheeze or overt sinus  or hb symptoms.  ? ?*** without nocturnal  or early am exacerbation  of respiratory  c/o's or need for noct saba. Also denies any obvious fluctuation of symptoms with weather or environmental changes or other aggravating or alleviating factors except as outlined above  ? ?No unusual exposure hx or h/o childhood pna/ asthma or knowledge of premature birth. ? ?Current Allergies, Complete Past Medical History, Past Surgical History, Family History, and Social History were reviewed in [employer] record. ? ?ROS  The following are not active complaints unless bolded ?Hoarseness, sore throat, dysphagia, dental problems, itching, sneezing,  nasal congestion or discharge of excess mucus or purulent secretions, ear ache,   fever, chills, sweats, unintended wt loss or wt gain, classically pleuritic  or exertional cp,  orthopnea pnd or arm/hand swelling  or leg swelling, presyncope, palpitations, abdominal pain, anorexia, nausea, vomiting, diarrhea  or change in bowel habits or change in bladder habits, change in stools or change in urine, dysuria, hematuria,  rash, arthralgias, visual complaints, headache, numbness, weakness or ataxia or problems with walking or coordination,  change in mood or  memory. ?      ? ?No outpatient medications have been marked as taking for the 03/26/22 encounter (Appointment) with Catherine Rockers, MD.  ?     ?  ? ?  ? ?Past Medical History:  ?Diagnosis Date  ? Elevated cholesterol   ? GERD (gastroesophageal reflux disease)   ? Thyroid disease   ? Vitamin D deficiency   ? ? ?  ? ?  ? ?Objective:  ?  ? ? 03/26/2022        ***   ?11/16/21 221 lb 0.6 oz (100.3 kg)  ?10/19/21 222 lb (100.7 kg)  ?06/19/21 227 lb (103 kg)  ?  ?Vital signs reviewed  03/26/2022  - Note at rest 02 sats  ***% on ***  ? ?General appearance:    ***  ?  ? ?  ? ?   ?Assessment  ? ?  ? ? ?  ? ?   ?  ?

## 2022-03-26 ENCOUNTER — Ambulatory Visit: Payer: Medicaid Other | Admitting: Internal Medicine

## 2022-04-08 ENCOUNTER — Encounter (INDEPENDENT_AMBULATORY_CARE_PROVIDER_SITE_OTHER): Payer: Self-pay

## 2022-04-08 ENCOUNTER — Telehealth: Payer: Self-pay | Admitting: Nurse Practitioner

## 2022-04-08 ENCOUNTER — Ambulatory Visit (INDEPENDENT_AMBULATORY_CARE_PROVIDER_SITE_OTHER): Payer: Medicaid Other | Admitting: Gastroenterology

## 2022-04-08 MED ORDER — TIRZEPATIDE 2.5 MG/0.5ML ~~LOC~~ SOAJ: 2.5000 mg | SUBCUTANEOUS | 0 refills | Status: DC

## 2022-04-08 NOTE — Telephone Encounter (Signed)
Called the patient and she stated that she is really upset because her weight is fluctuating up and down and she feels like the Victoza is  not working and she wants to know if she can use one of the following: ?1) Wegovy ?2) Mounjaro ? ?She had someone tell her about these and she is not sure what needs to be done to help her loose weight. ? ?Please advise. ?

## 2022-04-08 NOTE — Telephone Encounter (Signed)
Called the patient and gave her the message from [place]. Patient verbalized an understanding. ?

## 2022-04-08 NOTE — Telephone Encounter (Signed)
Pt called and said she needs to speak with you about 2 medications she has questions about. [phone] ?

## 2022-04-08 NOTE — Telephone Encounter (Signed)
Until recently, Medicaid did not provide optimal coverage for those medications.  I have seen some people get approved for the [facility] so I would be happy to send it in to see if we can get her approved. This will take the place of the Victoza prescription.

## 2022-04-09 NOTE — Telephone Encounter (Signed)
Pt called and said the pharmacy sent over a PA for this medication.  ?

## 2022-04-10 NOTE — Telephone Encounter (Signed)
Called [facility] at [phone] and spoke to Mya.  ?Mounjaro 2.5mg  has been approved ?Approval Number: [id number] ?Approved from 04/10/2022 through 04/05/2023 ? ?Called the patient to inform her and she will reach out to her pharmacy to have them fill this medication. ?

## 2022-04-10 NOTE — Telephone Encounter (Signed)
Patient called and said the pharmacy is still waiting on a PA. Patient would like a call back ?

## 2022-04-12 ENCOUNTER — Telehealth: Payer: Self-pay | Admitting: Internal Medicine

## 2022-04-12 NOTE — Telephone Encounter (Signed)
Sorry, we can't do narcotics even for pulmonary problems (which this isn't)  without seeing patient and doing database searches so suggest she go to [facility] if PCP not able to do so  ?

## 2022-04-12 NOTE — Telephone Encounter (Signed)
Pt asking if Dr. Sherene Sires could send Lortab 10 or hydrocodone 10 for headaches until she can see a neurologist she is waiting for an appointment. Patient has tried OTC medicine for headaches and states they are not working. She has had a headache everyday and is having a hard time getting the pain from the headaches under control.  ? PCP has refused to call in for patient.  ? ?Dr. Sherene Sires please advise  ? ? ?

## 2022-04-12 NOTE — Telephone Encounter (Signed)
Called and notified patient of response. She voiced understanding and states she will try urgent care and if issue has not resolved by appt on 5/12 then she will readdress with Dr. Melvyn Novas then. Nothing further needed at this time  ?

## 2022-04-22 ENCOUNTER — Telehealth (INDEPENDENT_AMBULATORY_CARE_PROVIDER_SITE_OTHER): Payer: No Typology Code available for payment source | Admitting: Psychiatry

## 2022-04-22 ENCOUNTER — Encounter (HOSPITAL_COMMUNITY): Payer: Self-pay | Admitting: Psychiatry

## 2022-04-22 DIAGNOSIS — F3162 Bipolar disorder, current episode mixed, moderate: Secondary | ICD-10-CM | POA: Diagnosis not present

## 2022-04-22 DIAGNOSIS — F902 Attention-deficit hyperactivity disorder, combined type: Secondary | ICD-10-CM

## 2022-04-22 MED ORDER — LISDEXAMFETAMINE DIMESYLATE 70 MG PO CAPS: 70.0000 mg | ORAL_CAPSULE | Freq: Every day | ORAL | 0 refills | Status: DC

## 2022-04-22 MED ORDER — LISDEXAMFETAMINE DIMESYLATE 70 MG PO CAPS
70.0000 mg | ORAL_CAPSULE | Freq: Every day | ORAL | 0 refills | Status: DC
Start: 2022-04-22 — End: 2022-05-03

## 2022-04-22 MED ORDER — CLONAZEPAM 1 MG PO TABS: 1.0000 mg | ORAL_TABLET | Freq: Three times a day (TID) | ORAL | 2 refills | Status: DC

## 2022-04-22 MED ORDER — DULOXETINE HCL 60 MG PO CPEP: 60.0000 mg | ORAL_CAPSULE | Freq: Two times a day (BID) | ORAL | 2 refills | Status: DC

## 2022-04-22 MED ORDER — ESZOPICLONE 3 MG PO TABS: 3.0000 mg | ORAL_TABLET | Freq: Every evening | ORAL | 2 refills | Status: DC | PRN

## 2022-04-22 MED ORDER — CARIPRAZINE HCL 3 MG PO CAPS: ORAL_CAPSULE | ORAL | 3 refills | Status: DC

## 2022-04-22 NOTE — Progress Notes (Signed)
Virtual Visit via Telephone Note  I connected with Catherine Howard on 04/22/22 at 11:20 AM EDT by telephone and verified that I am speaking with the correct person using two identifiers.  Location: Patient: home Provider: office   I discussed the limitations, risks, security and privacy concerns of performing an evaluation and management service by telephone and the availability of in person appointments. I also discussed with the patient that there may be a patient responsible charge related to this service. The patient expressed understanding and agreed to proceed.      I discussed the assessment and treatment plan with the patient. The patient was provided an opportunity to ask questions and all were answered. The patient agreed with the plan and demonstrated an understanding of the instructions.   The patient was advised to call back or seek an in-person evaluation if the symptoms worsen or if the condition fails to improve as anticipated.  I provided 15 minutes of non-face-to-face time during this encounter.   [name], MD  [facility] MD/PA/NP OP Progress Note  04/22/2022 11:35 AM Catherine Howard  MRN:  [id number]  Chief Complaint:  Chief Complaint  Patient presents with   Depression   Anxiety   Manic Behavior   ADHD   Follow-up   Virtual Visit via Telephone Note  I connected with Catherine Howard on 04/22/22 at 11:20 AM EDT by telephone and verified that I am speaking with the correct person using two identifiers.  Location: Patient: home Provider: office   I discussed the limitations, risks, security and privacy concerns of performing an evaluation and management service by telephone and the availability of in person appointments. I also discussed with the patient that there may be a patient responsible charge related to this service. The patient expressed understanding and agreed to proceed.     I discussed the assessment and treatment plan with the patient. The patient  was provided an opportunity to ask questions and all were answered. The patient agreed with the plan and demonstrated an understanding of the instructions.   The patient was advised to call back or seek an in-person evaluation if the symptoms worsen or if the condition fails to improve as anticipated.  I provided 12 minutes of non-face-to-face time during this encounter.   [name], MD  HPI: This patient is a 46 year old divorced white female who lives with her fianc, 66 year old daughter and 75 year old son in [place]. Her 42 year old son died of a drug overdose in 2013/01/09. The patient does not work and is on disability   The patient returns for follow-up after 3 months regarding her depression anxiety bipolar disorder and ADHD.  The patient states that she has had a lot more on her mind lately.  Her father's lung cancer is metastasizing.  He is refusing any further treatment.  Her uncle is also getting sicker with dementia.  She and her family are trying to find a different house because they do not think there landlord is doing any the needed repairs to the house they live in.  All of this seems to play on her mind and keep her awake at night.  She does not think the Belsomra is working.  I had offered to switch her to trazodone but she does not think that worked either.  She also has tried melatonin, Restoril and Ambien.  I explained that we are going to run out of sleep aids but we can try Lunesta to see if this will help.  Her mood  is fairly stable although she is a little bit more down lately.  She is focusing well on her anxiety is under good control.  She is still having difficulties with chronic headaches and is currently not on any pain medication.  She is scheduled to see neurology. Visit Diagnosis:    ICD-10-CM   1. Bipolar 1 disorder, mixed, moderate (HCC)  F31.62     2. Attention deficit hyperactivity disorder (ADHD), combined type  F90.2       Past Psychiatric History:  Outpatient treatment for the last several years  Past Medical History:  Past Medical History:  Diagnosis Date   ADHD    Bipolar I disorder (HCC)    Elevated cholesterol    GERD (gastroesophageal reflux disease)    Thyroid disease    Type 2 diabetes mellitus (HCC)    Vitamin D deficiency     Past Surgical History:  Procedure Laterality Date   CESAREAN SECTION     DILATION AND CURETTAGE OF UTERUS      Family Psychiatric History: see below  Family History:  Family History  Problem Relation Age of Onset   Schizophrenia Father    Alcohol abuse Father    Diabetes Father    Hyperlipidemia Father    Alcohol abuse Paternal Uncle     Social History:  Social History   Socioeconomic History   Marital status: Single    Spouse name: Not on file   Number of children: Not on file   Years of education: Not on file   Highest education level: Not on file  Occupational History   Not on file  Tobacco Use   Smoking status: Every Day    Packs/day: 2.00    Years: 32.00    Pack years: 64.00    Types: Cigarettes   Smokeless tobacco: Never   Tobacco comments:    smokes 3/4 of a pack a day--01/22/2021  Vaping Use   Vaping Use: Former  Substance and Sexual Activity   Alcohol use: No    Alcohol/week: 0.0 standard drinks   Drug use: No   Sexual activity: Yes    Partners: Male    Birth control/protection: None  Other Topics Concern   Not on file  Social History Narrative   Not on file   Social Determinants of Health   Financial Resource Strain: Not on file  Food Insecurity: Not on file  Transportation Needs: Not on file  Physical Activity: Not on file  Stress: Not on file  Social Connections: Not on file    Allergies: No Known Allergies  Metabolic Disorder Labs: Lab Results  Component Value Date   HGBA1C 6.1 02/28/2022   No results found for: PROLACTIN Lab Results  Component Value Date   CHOL 221 (H) 10/17/2021   TRIG 329 (H) 10/17/2021   HDL 39 (L) 10/17/2021    CHOLHDL 5.7 (H) 10/17/2021   LDLCALC 124 (H) 10/17/2021   LDLCALC 124 10/17/2021   Lab Results  Component Value Date   TSH 1.670 02/27/2022   TSH 0.575 10/17/2021    Therapeutic Level Labs: Lab Results  Component Value Date   LITHIUM 0.40 (L) 07/14/2014   No results found for: VALPROATE No components found for:  CBMZ  Current Medications: Current Outpatient Medications  Medication Sig Dispense Refill   eszopiclone 3 MG TABS Take 1 tablet (3 mg total) by mouth at bedtime as needed. Take immediately before bedtime 30 tablet 2   ACCU-CHEK AVIVA PLUS test strip SMARTSIG:Via Meter  Accu-Chek Softclix Lancets lancets SMARTSIG:Topical     Azelastine-Fluticasone 137-50 MCG/ACT SUSP Place 1 spray into the nose in the morning and at bedtime. One spray each nostril two times daily     budesonide-formoterol (SYMBICORT) 80-4.5 MCG/ACT inhaler Take 2 puffs first thing in am and then another 2 puffs about 12 hours later. 1 each 12   butalbital-acetaminophen-caffeine (FIORICET) 50-325-40 MG tablet Take by mouth.     cariprazine (VRAYLAR) 3 MG capsule TAKE (1) CAPSULE BY MOUTH ONCE DAILY. 30 capsule 3   chlorpheniramine-HYDROcodone 10-8 MG/5ML Take by mouth.     clonazePAM (KLONOPIN) 1 MG tablet Take 1 tablet (1 mg total) by mouth 3 (three) times daily. 90 tablet 2   DULoxetine (CYMBALTA) 60 MG capsule Take 1 capsule (60 mg total) by mouth 2 (two) times daily. 180 capsule 2   esomeprazole (NEXIUM) 40 MG capsule Take 30- 60 min before your first and last meals of the day 60 capsule 2   fenofibrate (TRICOR) 145 MG tablet TAKE 1 TABLET BY MOUTH ONCE DAILY. 90 tablet 0   glipiZIDE (GLUCOTROL) 5 MG tablet Take 1 tablet (5 mg total) by mouth 2 (two) times daily before a meal. 180 tablet 3   ipratropium (ATROVENT HFA) 17 MCG/ACT inhaler Inhale 2 puffs into the lungs every 6 (six) hours as needed for wheezing. 1 each 11   levocetirizine (XYZAL) 5 MG tablet Take 5 mg by mouth at bedtime.      levocetirizine (XYZAL) 5 MG tablet 1 tablet in the evening     levothyroxine (SYNTHROID) 75 MCG tablet Take 1 tablet (75 mcg total) by mouth daily. 90 tablet 3   linaclotide (LINZESS) 72 MCG capsule Take 1 capsule (72 mcg total) by mouth daily before breakfast. 90 capsule 1   lisdexamfetamine (VYVANSE) 70 MG capsule Take 1 capsule (70 mg total) by mouth daily. 30 capsule 0   lisdexamfetamine (VYVANSE) 70 MG capsule Take 1 capsule (70 mg total) by mouth daily. 30 capsule 0   lisdexamfetamine (VYVANSE) 70 MG capsule Take 1 capsule (70 mg total) by mouth daily. 30 capsule 0   NOVOFINE PEN NEEDLE 32G X 6 MM MISC Use to inject Victoza once daily 100 each 3   NYSTATIN powder Apply topically.     pioglitazone (ACTOS) 30 MG tablet Take 1 tablet (30 mg total) by mouth daily. 90 tablet 3   polyethylene glycol-electrolytes (TRILYTE) 420 g solution Take 4,000 mLs by mouth as directed. 4000 mL 0   tirzepatide (MOUNJARO) 2.5 MG/0.5ML Pen Inject 2.5 mg into the skin once a week. 2 mL 0   tiZANidine (ZANAFLEX) 4 MG tablet Take 4 mg by mouth 2 (two) times daily.     TRELEGY ELLIPTA 100-62.5-25 MCG/ACT AEPB Inhale 1 puff into the lungs daily.     VENTOLIN HFA 108 (90 Base) MCG/ACT inhaler Inhale 2 puffs into the lungs every 4 (four) hours as needed.     Vitamin D, Ergocalciferol, (DRISDOL) 1.25 MG (50000 UNIT) CAPS capsule Take 50,000 Units by mouth every 7 (seven) days.     No current facility-administered medications for this visit.     Musculoskeletal: Strength & Muscle Tone: na Gait & Station: na Patient leans: N/A  Psychiatric Specialty Exam: Review of Systems  Neurological:  Positive for headaches.  Psychiatric/Behavioral:  Positive for sleep disturbance.   All other systems reviewed and are negative.  There were no vitals taken for this visit.There is no height or weight on file to calculate BMI.  General  Appearance: NA  Eye Contact:  NA  Speech:  Clear and Coherent  Volume:  Normal  Mood:   Anxious  Affect:  NA  Thought Process:  Goal Directed  Orientation:  Full (Time, Place, and Person)  Thought Content: Rumination   Suicidal Thoughts:  No  Homicidal Thoughts:  No  Memory:  Immediate;   Good Recent;   Good Remote;   Fair  Judgement:  Good  Insight:  Fair  Psychomotor Activity:  Decreased  Concentration:  Concentration: Good and Attention Span: Good  Recall:  Good  Fund of Knowledge: Fair  Language: Good  Akathisia:  No  Handed:  Right  AIMS (if indicated): not done  Assets:  Communication Skills Desire for Improvement Resilience Social Support Talents/Skills  ADL's:  Intact  Cognition: WNL  Sleep:  Poor   Screenings: PHQ2-9    Flowsheet Row [facility] Video Visit from 01/23/2022 in [facility] Video Visit from 10/23/2021 in [facility] Video Visit from 07/27/2021 in [facility]  PHQ-2 Total Score 1 1 1 1       Flowsheet Row Video Visit from 04/22/2022 in [facility] Video Visit from 01/23/2022 in [facility] Video Visit from 10/23/2021 in [facility]  C-SSRS RISK CATEGORY No Risk No Risk No Risk        Assessment and Plan: This patient is a 46 year old female with a history of bipolar disorder anxiety and ADHD.  She is doing okay but not sleeping well.  She has tried numerous sleep aids but we will switch to Lunesta 3 mg at bedtime.  She will continue clonazepam 1 mg 3 times daily for anxiety, Cymbalta 60 mg twice daily for depression, Vraylar 3 mg daily for mood stabilization and Vyvanse 70 mg every morning for ADHD.  She will return to see me in 6 weeks in person.  Collaboration of Care: Collaboration of Care: Primary Care Provider AEB  chart notes will be made available to PCP at patient's request  Patient/Guardian was advised Release of Information must be obtained prior to any record release in order to collaborate their care with an outside provider. Patient/Guardian was advised if they have not already done so to contact the registration department to sign all necessary forms in order for [place] to release information regarding their care.   Consent: Patient/Guardian gives verbal consent for treatment and assignment of benefits for services provided during this visit. Patient/Guardian expressed understanding and agreed to proceed.    [name], MD 04/22/2022, 11:35 AM

## 2022-05-03 ENCOUNTER — Telehealth: Payer: Self-pay | Admitting: Internal Medicine

## 2022-05-03 ENCOUNTER — Encounter: Payer: Self-pay | Admitting: Internal Medicine

## 2022-05-03 ENCOUNTER — Ambulatory Visit (INDEPENDENT_AMBULATORY_CARE_PROVIDER_SITE_OTHER): Payer: Medicaid Other | Admitting: Internal Medicine

## 2022-05-03 DIAGNOSIS — J449 Chronic obstructive pulmonary disease, unspecified: Secondary | ICD-10-CM

## 2022-05-03 DIAGNOSIS — R058 Other specified cough: Secondary | ICD-10-CM | POA: Diagnosis not present

## 2022-05-03 DIAGNOSIS — F17210 Nicotine dependence, cigarettes, uncomplicated: Secondary | ICD-10-CM

## 2022-05-03 NOTE — Telephone Encounter (Signed)
Don't see where we've rec for the noc cough  :  try take CHLORPHENIRAMINE  4 mg  ("Allergy Relief" 4mg   at [facility] should be easiest to find in the blue box usually on bottom shelf)  take one every 4 hours as needed - extremely effective and inexpensive over the counter- may cause drowsiness so start with just a dose or two an hour before bedtime and see how you tolerate it before trying in daytime prn ?

## 2022-05-03 NOTE — Telephone Encounter (Signed)
Mychart message sent to patient since she has an active mychart status and can have recommendations in writing  ?

## 2022-05-03 NOTE — Assessment & Plan Note (Signed)
Try off dpi and on max gerd rx 06/21/2020  ?- try again 11/16/2021 >>>  05/03/2022 still using mint gum, not raising HOB  ?- added 1st gen H1 blockers per guidelines  05/03/2022 >>>  ? ?F/u ent if not improving on rx as above ? ?    ?  ? ?Each maintenance medication was reviewed in detail including emphasizing most importantly the difference between maintenance and prns and under what circumstances the prns are to be triggered using an action plan format where appropriate. ? ?Total time for H and P, chart review, counseling, reviewing hfa device(s) and generating customized AVS unique to this office visit / same day charting  > 30 min summary final pulmonary f/u ov  ?

## 2022-05-03 NOTE — Patient Instructions (Addendum)
GERD (REFLUX)  is an extremely common cause of respiratory symptoms just like yours , many times with no obvious heartburn at all.  ? ? It can be treated with medication, but also with lifestyle changes including elevation of the head of your bed (ideally with 6 -8inch blocks under the headboard of your bed),  Smoking cessation, avoidance of late meals, excessive alcohol, and avoid fatty foods, chocolate, peppermint, colas, red wine, and acidic juices such as orange juice.  ?NO MINT OR MENTHOL PRODUCTS SO NO COUGH DROPS  ?USE SUGARLESS CANDY INSTEAD (Jolley ranchers or Stover's or Life Savers) or even ice chips will also do - the key is to swallow to prevent all throat clearing. ?NO OIL BASED VITAMINS - use powdered substitutes.  Avoid fish oil when coughing.  ? ? ?Stop symbicort to see what difference it makes - if worse then resume it  ? ?Cal if not better on the above plan after  [facility] day and I will refer you to ENT in [place]  ? ?Add for night time cough :   try take CHLORPHENIRAMINE  4 mg  ("Allergy Relief" 4mg   at [facility] should be easiest to find in the blue box usually on bottom shelf)  take one every 4 hours as needed  start with just a dose or two an hour before bedtime   ? ? ?Pulmonary follow up is as needed  ? ? ? ?

## 2022-05-03 NOTE — Progress Notes (Signed)
? ?Catherine Howard, female    DOB: [birth year]    MRN: [id number] ? ? ?Brief patient profile:  ?67  yowf active smoker with brother with Down Syndrome with last IUP > wt baseline then 196 in 2008 with onset around 2019 sob/cough with nl pfts 07/2018 x for low ERV rx chronically with Trelegy and prn atrovent and referred to pulmonary clinic in [place]  06/21/2020 by Dr  Bartolo Darter ? ? ? ? ?History of Present Illness  ?06/21/2020  Pulmonary/ 1st office eval/Catherine Howard  No vacicination / no trelegy on day of ov ?Chief Complaint  ?Patient presents with  ? Pulmonary Consult  ?  Referred by Dr. Vidal Schwalbe. Former Dr Luan Pulling pt. She states has hx of asthma and COPD. Breathing is "so so today". She c/o cough and feeling of mucus stuck in her throat. She is using atrovent schedued qid.   ?Dyspnea: does flat surface ok all days, it's the hills and steps = doe  then use atrovent, never prechallenges ?MMRC1 = can walk nl pace, flat grade, can't hurry or go uphills or steps s sob   ?Cough: sporadic but does not wake her up/ something stuck in throat daytime but no mucus ?Sleep: doesn't bother her sleep and doesn't wake her up x months ?SABA use: says atrovent helps  ?Rec ?Stop the trelegy now  ?Try Atrovent 30  min before an activity that you know would make you short of breath  ?Continue nexium Take 30- 60 min before your first and last meals of the day  ?GERD diet reviewed, bed blocks rec  ?I strongly recommend the moderna or pfizer as soon as you can based on proven safety and effectiveness in over 150 million [place] ?The key is to stop smoking completely before smoking completely stops you! ? ? ?11/16/2021  f/u ov/[place] office/Catherine Howard re: GOLD 0 copd maint on trelegy and atovent hfa prn but technique very poor   ?Chief Complaint  ?Patient presents with  ? Follow-up  ?  Persistent dry cough. Feels she has some breathing struggles but thinks may be related to anxiety.   ?Dyspnea:  MMRC1 = can walk nl pace, flat grade, can't hurry  or go uphills or steps s sob   ?Walks to [place] and back can take up to 30 min flat grade  ?Cough: wakes in am with it/ feels sensation of globus / gagging but dry  ?Sleeping: most nights wakes her up bed is flat  ?SABA use: using atrovent once a day seem sto help  ?02: none  ?Covid status: never vax   ?Rec ?Plan A = Automatic = Always=    symbicort 80 Take 2 puffs first thing in am and then another 2 puffs about 12 hours later.  ?Work on inhaler technique:  ?Changed nexium 40 mg Take 30- 60 min before your first and last meals of the day  ?Plan B = Backup (to supplement plan A, not to replace it) ?Only use your albuterol inhaler as a rescue medication ?GERD diet reviewed, bed blocks rec    ?Please schedule a follow up office visit in 6 weeks, call sooner if needed with all medications /inhalers/ solutions in hand  ? ? ? ? ?05/03/2022  f/u ov/Catherine Howard re: GOLD 0  maint on symbicort 80 2bid  nexium bid ac  - did not bring meds /inhalers as requested ?Chief Complaint  ?Patient presents with  ? Follow-up  ?  Persistent dry cough and headaches are making patients ears hurts. (Phone  note)  ?Dyspnea:  still walking [place] and back  ?Cough: wakes her up after 1st hour x months / using spearmint gum to suppress urge to cough. ?Sleeping: using cpap no bed blocks  ?SABA use: not using doesn't help neither does symbicort 80  ?02: none  ?Covid status: never vax, never infected  ?  ? ? ?No obvious day to day or daytime variability or assoc excess/ purulent sputum or mucus plugs or hemoptysis or cp or chest tightness, subjective wheeze or overt sinus or hb symptoms.  ? ? Also denies any obvious fluctuation of symptoms with weather or environmental changes or other aggravating or alleviating factors except as outlined above  ? ?No unusual exposure hx or h/o childhood pna/ asthma or knowledge of premature birth. ? ?Current Allergies, Complete Past Medical History, Past Surgical History, Family History, and Social History were  reviewed in [employer] record. ? ?ROS  The following are not active complaints unless bolded ?Hoarseness, sore throat, dysphagia, dental problems, itching, sneezing,  nasal congestion or discharge of excess mucus or purulent secretions, ear ache,   fever, chills, sweats, unintended wt loss or wt gain, classically pleuritic or exertional cp,  orthopnea pnd or arm/hand swelling  or leg swelling, presyncope, palpitations, abdominal pain, anorexia, nausea, vomiting, diarrhea  or change in bowel habits or change in bladder habits, change in stools or change in urine, dysuria, hematuria,  rash, arthralgias, visual complaints, headache x years worse as day goes on, migratory, numbness, weakness or ataxia or problems with walking or coordination,  change in mood or  memory. ?      ? ?Current Meds  ?Medication Sig  ? ACCU-CHEK AVIVA PLUS test strip SMARTSIG:Via Meter  ? Accu-Chek Softclix Lancets lancets SMARTSIG:Topical  ? Azelastine-Fluticasone 137-50 MCG/ACT SUSP Place 1 spray into the nose in the morning and at bedtime. One spray each nostril two times daily  ? budesonide-formoterol (SYMBICORT) 80-4.5 MCG/ACT inhaler Take 2 puffs first thing in am and then another 2 puffs about 12 hours later.  ? butalbital-acetaminophen-caffeine (FIORICET) 50-325-40 MG tablet Take by mouth.  ? cariprazine (VRAYLAR) 3 MG capsule TAKE (1) CAPSULE BY MOUTH ONCE DAILY.  ? clonazePAM (KLONOPIN) 1 MG tablet Take 1 tablet (1 mg total) by mouth 3 (three) times daily.  ? DULoxetine (CYMBALTA) 60 MG capsule Take 1 capsule (60 mg total) by mouth 2 (two) times daily.  ? esomeprazole (NEXIUM) 40 MG capsule Take 30- 60 min before your first and last meals of the day  ? eszopiclone 3 MG TABS Take 1 tablet (3 mg total) by mouth at bedtime as needed. Take immediately before bedtime  ? fenofibrate (TRICOR) 145 MG tablet TAKE 1 TABLET BY MOUTH ONCE DAILY.  ? glipiZIDE (GLUCOTROL) 5 MG tablet Take 1 tablet (5 mg total) by mouth 2  (two) times daily before a meal.  ? ipratropium (ATROVENT HFA) 17 MCG/ACT inhaler Inhale 2 puffs into the lungs every 6 (six) hours as needed for wheezing.  ? levocetirizine (XYZAL) 5 MG tablet Take 5 mg by mouth at bedtime.  ? levocetirizine (XYZAL) 5 MG tablet 1 tablet in the evening  ? levothyroxine (SYNTHROID) 75 MCG tablet Take 1 tablet (75 mcg total) by mouth daily.  ? linaclotide (LINZESS) 72 MCG capsule Take 1 capsule (72 mcg total) by mouth daily before breakfast.  ? lisdexamfetamine (VYVANSE) 70 MG capsule Take 1 capsule (70 mg total) by mouth daily.  ? NOVOFINE PEN NEEDLE 32G X 6 MM MISC Use to  inject Victoza once daily  ? NYSTATIN powder Apply topically.  ? pioglitazone (ACTOS) 30 MG tablet Take 1 tablet (30 mg total) by mouth daily.  ? tirzepatide [facility]) 2.5 MG/0.5ML Pen Inject 2.5 mg into the skin once a week.  ? tiZANidine (ZANAFLEX) 4 MG tablet Take 4 mg by mouth 2 (two) times daily.  ? Vitamin D, Ergocalciferol, (DRISDOL) 1.25 MG (50000 UNIT) CAPS capsule Take 50,000 Units by mouth every 7 (seven) days.  ?     ? ? ? ? ?Past Medical History:  ?Diagnosis Date  ? Elevated cholesterol   ? GERD (gastroesophageal reflux disease)   ? Thyroid disease   ? Vitamin D deficiency   ? ? ?  ? ?  ? ?Objective:  ?  ?Wts ? ?05/03/2022       219   ?11/16/21 221 lb 0.6 oz (100.3 kg)  ?10/19/21 222 lb (100.7 kg)  ?06/19/21 227 lb (103 kg)  ?  ?Vital signs reviewed  05/03/2022  - Note at rest 02 sats  98% on RA  ? ?General appearance:    obese wf nad   ? ? HEENT : Oropharynx  clear  Nasal turbintes min edema no polyps  ? ? ?NECK :  without  appent JVD/ palpable Nodes/TM  ? ? ?LUNGS: no acc muscle use,  Nl contour chest which is clear to A and P bilaterally without cough on insp or exp maneuvers ? ? ?CV:  RRR  no s3 or murmur or increase in P2, and no edema  ? ?ABD:  tensely obese soft and nontender with nl inspiratory excursion in the supine position. No bruits or organomegaly appreciated  ? ?MS:  Nl gait/ ext warm  without deformities Or obvious joint restrictions  calf tenderness, cyanosis or clubbing  ?  ? ?SKIN: warm and dry without lesions   ? ?NEURO:  alert, approp, nl sensorium with  no motor or cerebellar defic

## 2022-05-03 NOTE — Assessment & Plan Note (Signed)
Active smoker  ?- PFT's  08/18/18   FEV1 2.52 (88 % ) ratio 0.87  p no  % improvement from saba p ? prior to study with DLCO  15.25 (66%) corrects to 3.93 (84%)  for alv volume and FV curve nl with ERV 26% at wt 193  ?- 06/21/2020  After extensive coaching inhaler device,  effectiveness =    25% at baseline (double triggers at end insp > > d/c trelegy (? Causing globus sensation, no change doe on vs off clinically) ?- 05/03/2022 d/c symbicort since convinced not helping doe or cough at wt  219 ? ?Reviewed pfts in setting of efforts to stop smoking and lose wt which will be a challenge but emphasized not too late to quit smoking and the wt issue is completely reversible  ? ?Pulmonary f/u can be prn  ?

## 2022-05-03 NOTE — Assessment & Plan Note (Signed)
4-5 min discussion re active cigarette smoking in addition to office E&M ? ?Ask about tobacco use:   ongoing ?Advise quitting   > 3 min discussion ?I reviewed the Fletcher curve with the patient that basically indicates  if you quit smoking when your best day FEV1 is still well preserved (as is clearly  the case here)  it is highly unlikely you will progress to severe disease and informed the patient there was  no medication on the market that has proven to alter the curve/ its downward trajectory  or the likelihood of progression of their disease(unlike other chronic medical conditions such as atheroclerosis where we do think we can change the natural hx with risk reducing meds)    Therefore stopping smoking and maintaining abstinence are  the most important aspects of her care, not choice of inhalers or for that matter, pulmonary  doctors.  ?Treatment other than smoking cessation  is entirely directed by severity of symptoms and focused also on reducing exacerbations, not attempting to change the natural history of the disease.   ?Assess willingness:  Not fully committed at this point but making some progress tapering  ?Assist in quit attempt:  Per PCP when ready ?Arrange follow up:   Follow up per Primary Care planned  ?  ?  ? ?

## 2022-05-06 ENCOUNTER — Other Ambulatory Visit: Payer: Self-pay | Admitting: Nurse Practitioner

## 2022-05-06 ENCOUNTER — Other Ambulatory Visit: Payer: Self-pay

## 2022-05-06 MED ORDER — TIRZEPATIDE 2.5 MG/0.5ML ~~LOC~~ SOAJ: 2.5000 mg | SUBCUTANEOUS | 0 refills | Status: DC

## 2022-05-15 ENCOUNTER — Encounter: Payer: Self-pay | Admitting: Neurology

## 2022-05-29 ENCOUNTER — Telehealth: Payer: Self-pay | Admitting: Nurse Practitioner

## 2022-05-29 MED ORDER — TIRZEPATIDE 5 MG/0.5ML ~~LOC~~ SOAJ: 5.0000 mg | SUBCUTANEOUS | 3 refills | Status: DC

## 2022-05-29 NOTE — Telephone Encounter (Signed)
Pt called and said she went to the pharmacy and it is needing a PA

## 2022-05-29 NOTE — Telephone Encounter (Signed)
Pt is requesting a refill on her tirzepatide [facility]) 2.5 MG/0.5ML Pen.  Patient uses [facility]

## 2022-05-29 NOTE — Telephone Encounter (Signed)
I sent in refill but I did increase the strength to 5 mg weekly for more effectiveness.

## 2022-05-29 NOTE — Telephone Encounter (Signed)
PA # [id number] Sent to pharmacy

## 2022-05-29 NOTE — Telephone Encounter (Signed)
Patient made aware.

## 2022-05-30 ENCOUNTER — Ambulatory Visit (INDEPENDENT_AMBULATORY_CARE_PROVIDER_SITE_OTHER): Payer: Medicaid Other | Admitting: Gastroenterology

## 2022-06-03 ENCOUNTER — Ambulatory Visit (HOSPITAL_COMMUNITY): Payer: No Typology Code available for payment source | Admitting: Psychiatry

## 2022-06-03 NOTE — Telephone Encounter (Signed)
Finish the 2.5 so she isnt wasting money.

## 2022-06-03 NOTE — Telephone Encounter (Signed)
Pt has one shot left of the 2.5mg , do you want her to finish that or just start the 5mg ?

## 2022-06-03 NOTE — Telephone Encounter (Signed)
No answer no vm

## 2022-06-04 NOTE — Telephone Encounter (Signed)
Informed patient of message.  

## 2022-06-05 ENCOUNTER — Telehealth (INDEPENDENT_AMBULATORY_CARE_PROVIDER_SITE_OTHER): Payer: No Typology Code available for payment source | Admitting: Psychiatry

## 2022-06-05 ENCOUNTER — Encounter (HOSPITAL_COMMUNITY): Payer: Self-pay | Admitting: Psychiatry

## 2022-06-05 DIAGNOSIS — F3162 Bipolar disorder, current episode mixed, moderate: Secondary | ICD-10-CM | POA: Diagnosis not present

## 2022-06-05 DIAGNOSIS — F902 Attention-deficit hyperactivity disorder, combined type: Secondary | ICD-10-CM | POA: Diagnosis not present

## 2022-06-05 MED ORDER — CARIPRAZINE HCL 3 MG PO CAPS: ORAL_CAPSULE | ORAL | 3 refills | Status: DC

## 2022-06-05 MED ORDER — METHYLPHENIDATE HCL ER (OSM) 54 MG PO TBCR: 54.0000 mg | EXTENDED_RELEASE_TABLET | ORAL | 0 refills | Status: DC

## 2022-06-05 MED ORDER — DULOXETINE HCL 60 MG PO CPEP
60.0000 mg | ORAL_CAPSULE | Freq: Two times a day (BID) | ORAL | 2 refills | Status: DC
Start: 2022-06-05 — End: 2022-07-04

## 2022-06-05 MED ORDER — ESZOPICLONE 3 MG PO TABS: 3.0000 mg | ORAL_TABLET | Freq: Every evening | ORAL | 2 refills | Status: DC | PRN

## 2022-06-05 MED ORDER — ALPRAZOLAM 1 MG PO TABS: 1.0000 mg | ORAL_TABLET | Freq: Three times a day (TID) | ORAL | 2 refills | Status: DC

## 2022-06-05 NOTE — Progress Notes (Signed)
Virtual Visit via Video Note  I connected with Catherine Howard on 06/05/22 at 11:20 AM EDT by a video enabled telemedicine application and verified that I am speaking with the correct person using two identifiers.  Location: Patient: home Provider: office   I discussed the limitations of evaluation and management by telemedicine and the availability of in person appointments. The patient expressed understanding and agreed to proceed.      I discussed the assessment and treatment plan with the patient. The patient was provided an opportunity to ask questions and all were answered. The patient agreed with the plan and demonstrated an understanding of the instructions.   The patient was advised to call back or seek an in-person evaluation if the symptoms worsen or if the condition fails to improve as anticipated.  I provided 15 minutes of non-face-to-face time during this encounter.   [name], MD  [facility] MD/PA/NP OP Progress Note  06/05/2022 11:48 AM Catherine Howard  MRN:  [id number]  Chief Complaint:  Chief Complaint  Patient presents with   Anxiety   Depression   Follow-up   HPI: This patient is a 46 year old divorced white female who lives with her 44, 70 year old daughter and 35 year old son in [place]. Her 34 year old son died of a drug overdose in 2013-04-26. The patient does not work and is on disability  The patient returns for follow-up after about 6 weeks regarding her depression anxiety bipolar disorder and ADHD.  The patient states that she and her mother had a big argument recently.  She states that her mother berated her and claims that she was not calling and often comparing her to her sister.  She has been quite upset about this and she claims that her mother has done this "my whole life."  She is still in contact with her father but she is not talking to her mother right now.  She is sleeping better with the [facility] but it is causing nightmares.  Nevertheless she does  not want to go back to any other previous sleeping medicines.  She is not focusing well with the Vyvanse and asked to change it so we will try Concerta.  She also thinks the Xanax worked better than clonazepam for her anxiety so we will change this back as well.  She is frustrated right now but denies significant depression thoughts of self-harm or suicide Visit Diagnosis:    ICD-10-CM   1. Bipolar 1 disorder, mixed, moderate (HCC)  F31.62     2. Attention deficit hyperactivity disorder (ADHD), combined type  F90.2       Past Psychiatric History: Outpatient treatment for the last several years  Past Medical History:  Past Medical History:  Diagnosis Date   ADHD    Bipolar I disorder ([place])    Elevated cholesterol    GERD (gastroesophageal reflux disease)    Thyroid disease    Type 2 diabetes mellitus ([place])    Vitamin D deficiency     Past Surgical History:  Procedure Laterality Date   CESAREAN SECTION     DILATION AND CURETTAGE OF UTERUS      Family Psychiatric History: See below  Family History:  Family History  Problem Relation Age of Onset   Schizophrenia Father    Alcohol abuse Father    Diabetes Father    Hyperlipidemia Father    Alcohol abuse Paternal Uncle     Social History:  Social History   Socioeconomic History   Marital status: Single  Spouse name: Not on file   Number of children: Not on file   Years of education: Not on file   Highest education level: Not on file  Occupational History   Not on file  Tobacco Use   Smoking status: Every Day    Packs/day: 2.00    Years: 32.00    Total pack years: 64.00    Types: Cigarettes   Smokeless tobacco: Never   Tobacco comments:    smokes 3/4 of a pack a day--01/22/2021  Vaping Use   Vaping Use: Former  Substance and Sexual Activity   Alcohol use: No    Alcohol/week: 0.0 standard drinks of alcohol   Drug use: No   Sexual activity: Yes    Partners: Male    Birth control/protection: None  Other  Topics Concern   Not on file  Social History Narrative   Not on file   Social Determinants of Health   Financial Resource Strain: Not on file  Food Insecurity: Not on file  Transportation Needs: Not on file  Physical Activity: Not on file  Stress: Not on file  Social Connections: Not on file    Allergies: No Known Allergies  Metabolic Disorder Labs: Lab Results  Component Value Date   HGBA1C 6.1 02/28/2022   No results found for: "PROLACTIN" Lab Results  Component Value Date   CHOL 221 (H) 10/17/2021   TRIG 329 (H) 10/17/2021   HDL 39 (L) 10/17/2021   CHOLHDL 5.7 (H) 10/17/2021   LDLCALC 124 (H) 10/17/2021   LDLCALC 124 10/17/2021   Lab Results  Component Value Date   TSH 1.670 02/27/2022   TSH 0.575 10/17/2021    Therapeutic Level Labs: Lab Results  Component Value Date   LITHIUM 0.40 (L) 07/14/2014   No results found for: "VALPROATE" No results found for: "CBMZ"  Current Medications: Current Outpatient Medications  Medication Sig Dispense Refill   ALPRAZolam (XANAX) 1 MG tablet Take 1 tablet (1 mg total) by mouth 3 (three) times daily. 90 tablet 2   methylphenidate (CONCERTA) 54 MG PO CR tablet Take 1 tablet (54 mg total) by mouth every morning. 30 tablet 0   ACCU-CHEK AVIVA PLUS test strip SMARTSIG:Via Meter     Accu-Chek Softclix Lancets lancets SMARTSIG:Topical     Azelastine-Fluticasone 137-50 MCG/ACT SUSP Place 1 spray into the nose in the morning and at bedtime. One spray each nostril two times daily     butalbital-acetaminophen-caffeine (FIORICET) 50-325-40 MG tablet Take by mouth.     cariprazine (VRAYLAR) 3 MG capsule TAKE (1) CAPSULE BY MOUTH ONCE DAILY. 30 capsule 3   DULoxetine (CYMBALTA) 60 MG capsule Take 1 capsule (60 mg total) by mouth 2 (two) times daily. 180 capsule 2   esomeprazole (NEXIUM) 40 MG capsule Take 30- 60 min before your first and last meals of the day 60 capsule 2   eszopiclone 3 MG TABS Take 1 tablet (3 mg total) by mouth at  bedtime as needed. Take immediately before bedtime 30 tablet 2   fenofibrate (TRICOR) 145 MG tablet TAKE 1 TABLET BY MOUTH ONCE DAILY. 90 tablet 0   glipiZIDE (GLUCOTROL) 5 MG tablet Take 1 tablet (5 mg total) by mouth 2 (two) times daily before a meal. 180 tablet 3   ipratropium (ATROVENT HFA) 17 MCG/ACT inhaler Inhale 2 puffs into the lungs every 6 (six) hours as needed for wheezing. 1 each 11   levocetirizine (XYZAL) 5 MG tablet Take 5 mg by mouth at bedtime.  levocetirizine (XYZAL) 5 MG tablet 1 tablet in the evening     levothyroxine (SYNTHROID) 75 MCG tablet TAKE 1 TABLET BY MOUTH ONCE DAILY. 90 tablet 0   linaclotide (LINZESS) 72 MCG capsule Take 1 capsule (72 mcg total) by mouth daily before breakfast. 90 capsule 1   NOVOFINE PEN NEEDLE 32G X 6 MM MISC Use to inject Victoza once daily 100 each 3   NYSTATIN powder Apply topically.     pioglitazone (ACTOS) 30 MG tablet Take 1 tablet (30 mg total) by mouth daily. 90 tablet 3   tirzepatide (MOUNJARO) 5 MG/0.5ML Pen Inject 5 mg into the skin once a week. 6 mL 3   tiZANidine (ZANAFLEX) 4 MG tablet Take 4 mg by mouth 2 (two) times daily.     Vitamin D, Ergocalciferol, (DRISDOL) 1.25 MG (50000 UNIT) CAPS capsule Take 50,000 Units by mouth every 7 (seven) days.     No current facility-administered medications for this visit.     Musculoskeletal: Strength & Muscle Tone: within normal limits Gait & Station: normal Patient leans: N/A  Psychiatric Specialty Exam: Review of Systems  Neurological:  Positive for headaches.  Psychiatric/Behavioral:  Positive for dysphoric mood. The patient is nervous/anxious.   All other systems reviewed and are negative.   There were no vitals taken for this visit.There is no height or weight on file to calculate BMI.  General Appearance: Casual and Fairly Groomed  Eye Contact:  Good  Speech:  Clear and Coherent  Volume:  Normal  Mood:  Dysphoric  Affect:  Appropriate and Congruent  Thought Process:   Goal Directed  Orientation:  Full (Time, Place, and Person)  Thought Content: WDL   Suicidal Thoughts:  No  Homicidal Thoughts:  No  Memory:  Immediate;   Good Recent;   Good Remote;   Fair  Judgement:  Good  Insight:  Fair  Psychomotor Activity:  Decreased  Concentration:  Concentration: Poor and Attention Span: Poor  Recall:  [employer] of Knowledge: Fair  Language: Good  Akathisia:  No  Handed:  Right  AIMS (if indicated): not done  Assets:  Communication Skills Desire for Improvement Resilience Social Support Talents/Skills  ADL's:  Intact  Cognition: WNL  Sleep:  Fair   Screenings: PHQ2-9    Flowsheet Row Video Visit from 06/05/2022 in [facility] Video Visit from 04/22/2022 in [place] Video Visit from 01/23/2022 in [facility] Video Visit from 10/23/2021 in [facility] Video Visit from 07/27/2021 in [facility]  PHQ-2 Total Score 1 1 1 1 1       Flowsheet Row Video Visit from 06/05/2022 in [facility] Video Visit from 04/22/2022 in [facility] Video Visit from 01/23/2022 in [place] No Risk No Risk No Risk        Assessment and Plan: This patient is a 46 year old female with a history of bipolar disorder and anxiety and ADHD.  She is sleeping better despite the nightmares she wants to continue on the Lunesta 3 mg at bedtime.  She is not happy with the clonazepam as its not helping anxiety is much so she will switch to Xanax 1 mg 3 times daily for anxiety and also switch to Vyvanse to Concerta 54 mg every morning for ADD.  She will continue Cymbalta 60 mg twice daily for depression and Vraylar 3  mg daily for mood  stabilization.  She will return to see me in 4 weeks  Collaboration of Care: Collaboration of Care: Primary Care Provider AEB notes will be shared with PCP at patient's request  Patient/Guardian was advised Release of Information must be obtained prior to any record release in order to collaborate their care with an outside provider. Patient/Guardian was advised if they have not already done so to contact the registration department to sign all necessary forms in order for [place] to release information regarding their care.   Consent: Patient/Guardian gives verbal consent for treatment and assignment of benefits for services provided during this visit. Patient/Guardian expressed understanding and agreed to proceed.    [name], MD 06/05/2022, 11:48 AM

## 2022-07-04 ENCOUNTER — Encounter (HOSPITAL_COMMUNITY): Payer: Self-pay | Admitting: Psychiatry

## 2022-07-04 ENCOUNTER — Telehealth (INDEPENDENT_AMBULATORY_CARE_PROVIDER_SITE_OTHER): Payer: No Typology Code available for payment source | Admitting: Psychiatry

## 2022-07-04 DIAGNOSIS — F902 Attention-deficit hyperactivity disorder, combined type: Secondary | ICD-10-CM | POA: Diagnosis not present

## 2022-07-04 DIAGNOSIS — F3162 Bipolar disorder, current episode mixed, moderate: Secondary | ICD-10-CM

## 2022-07-04 MED ORDER — ALPRAZOLAM 1 MG PO TABS: 1.0000 mg | ORAL_TABLET | Freq: Three times a day (TID) | ORAL | 2 refills | Status: DC

## 2022-07-04 MED ORDER — DULOXETINE HCL 60 MG PO CPEP: 60.0000 mg | ORAL_CAPSULE | Freq: Two times a day (BID) | ORAL | 2 refills | Status: DC

## 2022-07-04 MED ORDER — METHYLPHENIDATE HCL ER (OSM) 54 MG PO TBCR: 54.0000 mg | EXTENDED_RELEASE_TABLET | ORAL | 0 refills | Status: DC

## 2022-07-04 MED ORDER — METHYLPHENIDATE HCL ER (OSM) 54 MG PO TBCR
54.0000 mg | EXTENDED_RELEASE_TABLET | ORAL | 0 refills | Status: DC
Start: 2022-07-04 — End: 2022-07-08

## 2022-07-04 MED ORDER — CARIPRAZINE HCL 3 MG PO CAPS: ORAL_CAPSULE | ORAL | 3 refills | Status: DC

## 2022-07-04 MED ORDER — ESZOPICLONE 3 MG PO TABS
3.0000 mg | ORAL_TABLET | Freq: Every evening | ORAL | 2 refills | Status: DC | PRN
Start: 2022-07-04 — End: 2022-08-29

## 2022-07-04 NOTE — Progress Notes (Signed)
Virtual Visit via Telephone Note  I connected with Catherine Howard on 07/04/22 at  9:40 AM EDT by telephone and verified that I am speaking with the correct person using two identifiers.  Location: Patient: home Provider: home office   I discussed the limitations, risks, security and privacy concerns of performing an evaluation and management service by telephone and the availability of in person appointments. I also discussed with the patient that there may be a patient responsible charge related to this service. The patient expressed understanding and agreed to proceed.      I discussed the assessment and treatment plan with the patient. The patient was provided an opportunity to ask questions and all were answered. The patient agreed with the plan and demonstrated an understanding of the instructions.   The patient was advised to call back or seek an in-person evaluation if the symptoms worsen or if the condition fails to improve as anticipated.  I provided 15 minutes of non-face-to-face time during this encounter.   [name], MD  [facility] MD/PA/NP OP Progress Note  07/04/2022 9:44 AM Catherine Howard  MRN:  [id number]  Chief Complaint:  Chief Complaint  Patient presents with   Depression   Anxiety   ADD   Follow-up   HPI: This patient is a 46 year old divorced white female who lives with her fianc, 66 year old daughter and 86 year old son in [place]. Her 73 year old son died of a drug overdose in 04/26/2013. The patient does not work and is on disability  The patient returns for follow-up after 4 weeks.  Last time we substituted Concerta for Vyvanse that she seems to be focusing better.  We also went back to Xanax which seems to be helping her anxiety more than the clonazepam.  She and her family are still trying to find a new place to live which has been frustrating.  Other than that she does not report any other major stressors right now.  She states that generally she is sleeping  well.  She denies significant depression thoughts of self-harm or suicide auditory visual hallucinations or paranoia.  Overall her medications are working well.  Her blood sugar has improved and she has lost weight since starting Mounjaro. Visit Diagnosis:    ICD-10-CM   1. Bipolar 1 disorder, mixed, moderate (HCC)  F31.62     2. Attention deficit hyperactivity disorder (ADHD), combined type  F90.2       Past Psychiatric History: Outpatient treatment for the last several years  Past Medical History:  Past Medical History:  Diagnosis Date   ADHD    Bipolar I disorder (HCC)    Elevated cholesterol    GERD (gastroesophageal reflux disease)    Thyroid disease    Type 2 diabetes mellitus (HCC)    Vitamin D deficiency     Past Surgical History:  Procedure Laterality Date   CESAREAN SECTION     DILATION AND CURETTAGE OF UTERUS      Family Psychiatric History: see below  Family History:  Family History  Problem Relation Age of Onset   Schizophrenia Father    Alcohol abuse Father    Diabetes Father    Hyperlipidemia Father    Alcohol abuse Paternal Uncle     Social History:  Social History   Socioeconomic History   Marital status: Single    Spouse name: Not on file   Number of children: Not on file   Years of education: Not on file   Highest education level: Not on file  Occupational History   Not on file  Tobacco Use   Smoking status: Every Day    Packs/day: 2.00    Years: 32.00    Total pack years: 64.00    Types: Cigarettes   Smokeless tobacco: Never   Tobacco comments:    smokes 3/4 of a pack a day--01/22/2021  Vaping Use   Vaping Use: Former  Substance and Sexual Activity   Alcohol use: No    Alcohol/week: 0.0 standard drinks of alcohol   Drug use: No   Sexual activity: Yes    Partners: Male    Birth control/protection: None  Other Topics Concern   Not on file  Social History Narrative   Not on file   Social Determinants of Health   Financial  Resource Strain: Not on file  Food Insecurity: Not on file  Transportation Needs: Not on file  Physical Activity: Not on file  Stress: Not on file  Social Connections: Not on file    Allergies: No Known Allergies  Metabolic Disorder Labs: Lab Results  Component Value Date   HGBA1C 6.1 02/28/2022   No results found for: "PROLACTIN" Lab Results  Component Value Date   CHOL 221 (H) 10/17/2021   TRIG 329 (H) 10/17/2021   HDL 39 (L) 10/17/2021   CHOLHDL 5.7 (H) 10/17/2021   LDLCALC 124 (H) 10/17/2021   LDLCALC 124 10/17/2021   Lab Results  Component Value Date   TSH 1.670 02/27/2022   TSH 0.575 10/17/2021    Therapeutic Level Labs: Lab Results  Component Value Date   LITHIUM 0.40 (L) 07/14/2014   No results found for: "VALPROATE" No results found for: "CBMZ"  Current Medications: Current Outpatient Medications  Medication Sig Dispense Refill   methylphenidate (CONCERTA) 54 MG PO CR tablet Take 1 tablet (54 mg total) by mouth every morning. 30 tablet 0   ACCU-CHEK AVIVA PLUS test strip SMARTSIG:Via Meter     Accu-Chek Softclix Lancets lancets SMARTSIG:Topical     ALPRAZolam (XANAX) 1 MG tablet Take 1 tablet (1 mg total) by mouth 3 (three) times daily. 90 tablet 2   Azelastine-Fluticasone 137-50 MCG/ACT SUSP Place 1 spray into the nose in the morning and at bedtime. One spray each nostril two times daily     butalbital-acetaminophen-caffeine (FIORICET) 50-325-40 MG tablet Take by mouth.     cariprazine (VRAYLAR) 3 MG capsule TAKE (1) CAPSULE BY MOUTH ONCE DAILY. 30 capsule 3   DULoxetine (CYMBALTA) 60 MG capsule Take 1 capsule (60 mg total) by mouth 2 (two) times daily. 180 capsule 2   esomeprazole (NEXIUM) 40 MG capsule Take 30- 60 min before your first and last meals of the day 60 capsule 2   eszopiclone 3 MG TABS Take 1 tablet (3 mg total) by mouth at bedtime as needed. Take immediately before bedtime 30 tablet 2   fenofibrate (TRICOR) 145 MG tablet TAKE 1 TABLET BY  MOUTH ONCE DAILY. 90 tablet 0   glipiZIDE (GLUCOTROL) 5 MG tablet Take 1 tablet (5 mg total) by mouth 2 (two) times daily before a meal. 180 tablet 3   ipratropium (ATROVENT HFA) 17 MCG/ACT inhaler Inhale 2 puffs into the lungs every 6 (six) hours as needed for wheezing. 1 each 11   levocetirizine (XYZAL) 5 MG tablet Take 5 mg by mouth at bedtime.     levocetirizine (XYZAL) 5 MG tablet 1 tablet in the evening     levothyroxine (SYNTHROID) 75 MCG tablet TAKE 1 TABLET BY MOUTH ONCE DAILY. 90 tablet  0   linaclotide (LINZESS) 72 MCG capsule Take 1 capsule (72 mcg total) by mouth daily before breakfast. 90 capsule 1   methylphenidate (CONCERTA) 54 MG PO CR tablet Take 1 tablet (54 mg total) by mouth every morning. 30 tablet 0   NOVOFINE PEN NEEDLE 32G X 6 MM MISC Use to inject Victoza once daily 100 each 3   NYSTATIN powder Apply topically.     pioglitazone (ACTOS) 30 MG tablet Take 1 tablet (30 mg total) by mouth daily. 90 tablet 3   tirzepatide (MOUNJARO) 5 MG/0.5ML Pen Inject 5 mg into the skin once a week. 6 mL 3   tiZANidine (ZANAFLEX) 4 MG tablet Take 4 mg by mouth 2 (two) times daily.     Vitamin D, Ergocalciferol, (DRISDOL) 1.25 MG (50000 UNIT) CAPS capsule Take 50,000 Units by mouth every 7 (seven) days.     No current facility-administered medications for this visit.     Musculoskeletal: Strength & Muscle Tone: na Gait & Station: na Patient leans: N/A  Psychiatric Specialty Exam: Review of Systems  All other systems reviewed and are negative.   There were no vitals taken for this visit.There is no height or weight on file to calculate BMI.  General Appearance: NA  Eye Contact:  NA  Speech:  Clear and Coherent  Volume:  Normal  Mood:  Euthymic  Affect:  NA  Thought Process:  Goal Directed  Orientation:  Full (Time, Place, and Person)  Thought Content: WDL   Suicidal Thoughts:  No  Homicidal Thoughts:  No  Memory:  Immediate;   Good Recent;   Good Remote;   Fair   Judgement:  Good  Insight:  Fair  Psychomotor Activity:  Normal  Concentration:  Concentration: Good and Attention Span: Good  Recall:  Good  Fund of Knowledge: Good  Language: Good  Akathisia:  No  Handed:  Right  AIMS (if indicated): not done  Assets:  Communication Skills Desire for Improvement Resilience Social Support Talents/Skills  ADL's:  Intact  Cognition: WNL  Sleep:  Good   Screenings: PHQ2-9    Flowsheet Row Video Visit from 07/04/2022 in [place] Video Visit from 06/05/2022 in [place] Video Visit from 04/22/2022 in [facility] Video Visit from 01/23/2022 in [facility] Video Visit from 10/23/2021 in [facility]  PHQ-2 Total Score 1 1 1 1 1       Flowsheet Row Video Visit from 07/04/2022 in [facility] Video Visit from 06/05/2022 in [facility] Video Visit from 04/22/2022 in [facility]  C-SSRS RISK CATEGORY No Risk No Risk No Risk        Assessment and Plan: Patient is a 46 year old female with a history of bipolar disorder anxiety and ADHD.  She seems to be doing better on the current regimen.  She will continue Lunesta 3 mg at bedtime for sleep, Xanax 1 mg 3 times daily for anxiety, Concerta 54 mg every morning for ADD Cymbalta 60 mg twice daily for depression and Vraylar 3 mg daily for mood stabilization.  She will return to see me in 2 months  Collaboration of Care: Collaboration of Care: Primary Care Provider AEB notes will be shared with PCP at patient's request  Patient/Guardian was advised Release of Information must be obtained prior to any record release in order to collaborate their care with an outside provider.  Patient/Guardian was advised if they have not already done so to contact the registration department to sign all necessary forms in order for [place] to release information regarding their care.   Consent: Patient/Guardian gives verbal consent for treatment and assignment of benefits for services provided during this visit. Patient/Guardian expressed understanding and agreed to proceed.    [name], MD 07/04/2022, 9:44 AM

## 2022-07-08 ENCOUNTER — Encounter: Payer: Self-pay | Admitting: Nurse Practitioner

## 2022-07-08 ENCOUNTER — Ambulatory Visit (INDEPENDENT_AMBULATORY_CARE_PROVIDER_SITE_OTHER): Payer: Medicaid Other | Admitting: Nurse Practitioner

## 2022-07-08 VITALS — BP 114/78 | HR 109 | Ht 63.5 in | Wt 217.0 lb

## 2022-07-08 DIAGNOSIS — E782 Mixed hyperlipidemia: Secondary | ICD-10-CM | POA: Diagnosis not present

## 2022-07-08 DIAGNOSIS — F17200 Nicotine dependence, unspecified, uncomplicated: Secondary | ICD-10-CM

## 2022-07-08 DIAGNOSIS — E119 Type 2 diabetes mellitus without complications: Secondary | ICD-10-CM

## 2022-07-08 DIAGNOSIS — E559 Vitamin D deficiency, unspecified: Secondary | ICD-10-CM | POA: Diagnosis not present

## 2022-07-08 DIAGNOSIS — E039 Hypothyroidism, unspecified: Secondary | ICD-10-CM | POA: Diagnosis not present

## 2022-07-08 LAB — POCT GLYCOSYLATED HEMOGLOBIN (HGB A1C): HbA1c, POC (controlled diabetic range): 5.9 % (ref 0.0–7.0)

## 2022-07-08 LAB — POCT UA - MICROALBUMIN
Creatinine, POC: 50 mg/dL
Microalbumin Ur, POC: 10 mg/L

## 2022-07-08 MED ORDER — LEVOTHYROXINE SODIUM 75 MCG PO TABS
75.0000 ug | ORAL_TABLET | Freq: Every day | ORAL | 3 refills | Status: DC
Start: 2022-07-08 — End: 2023-06-16

## 2022-07-08 MED ORDER — FENOFIBRATE 145 MG PO TABS: 145.0000 mg | ORAL_TABLET | Freq: Every day | ORAL | 3 refills | Status: DC

## 2022-07-08 NOTE — Progress Notes (Signed)
07/08/2022, 9:38 AM Endocrinology Follow Up Visit   Subjective:    Patient ID: Catherine Howard, female    DOB: February 25, 1976.  Catherine Howard is being seen in follow up for management of currently uncontrolled symptomatic diabetes requested by  Smith Robert, MD.   Past Medical History:  Diagnosis Date   ADHD    Bipolar I disorder (HCC)    Elevated cholesterol    GERD (gastroesophageal reflux disease)    Thyroid disease    Type 2 diabetes mellitus (HCC)    Vitamin D deficiency     Past Surgical History:  Procedure Laterality Date   CESAREAN SECTION     DILATION AND CURETTAGE OF UTERUS      Social History   Socioeconomic History   Marital status: Single    Spouse name: Not on file   Number of children: Not on file   Years of education: Not on file   Highest education level: Not on file  Occupational History   Not on file  Tobacco Use   Smoking status: Every Day    Packs/day: 2.00    Years: 32.00    Total pack years: 64.00    Types: Cigarettes   Smokeless tobacco: Never   Tobacco comments:    smokes 3/4 of a pack a day--01/22/2021  Vaping Use   Vaping Use: Former  Substance and Sexual Activity   Alcohol use: No    Alcohol/week: 0.0 standard drinks of alcohol   Drug use: No   Sexual activity: Yes    Partners: Male    Birth control/protection: None  Other Topics Concern   Not on file  Social History Narrative   Not on file   Social Determinants of Health   Financial Resource Strain: Not on file  Food Insecurity: Not on file  Transportation Needs: Not on file  Physical Activity: Not on file  Stress: Not on file  Social Connections: Not on file    Family History  Problem Relation Age of Onset   Schizophrenia Father    Alcohol abuse Father    Diabetes Father    Hyperlipidemia Father    Alcohol abuse Paternal Uncle     Outpatient Encounter Medications as of  07/08/2022  Medication Sig   ACCU-CHEK AVIVA PLUS test strip SMARTSIG:Via Meter   Accu-Chek Softclix Lancets lancets SMARTSIG:Topical   ALPRAZolam (XANAX) 1 MG tablet Take 1 tablet (1 mg total) by mouth 3 (three) times daily.   Azelastine-Fluticasone 137-50 MCG/ACT SUSP Place 1 spray into the nose in the morning and at bedtime. One spray each nostril two times daily   butalbital-acetaminophen-caffeine (FIORICET) 50-325-40 MG tablet Take by mouth. (Patient not taking: Reported on 07/08/2022)   cariprazine (VRAYLAR) 3 MG capsule TAKE (1) CAPSULE BY MOUTH ONCE DAILY.   DULoxetine (CYMBALTA) 60 MG capsule Take 1 capsule (60 mg total) by mouth 2 (two) times daily.   esomeprazole (NEXIUM) 40 MG capsule Take 30- 60 min before your first and last meals of the day   eszopiclone 3 MG TABS Take 1 tablet (3 mg total) by mouth at bedtime as needed.  Take immediately before bedtime   fenofibrate (TRICOR) 145 MG tablet Take 1 tablet (145 mg total) by mouth daily.   ipratropium (ATROVENT HFA) 17 MCG/ACT inhaler Inhale 2 puffs into the lungs every 6 (six) hours as needed for wheezing.   levocetirizine (XYZAL) 5 MG tablet 1 tablet in the evening   levothyroxine (SYNTHROID) 75 MCG tablet Take 1 tablet (75 mcg total) by mouth daily.   linaclotide (LINZESS) 72 MCG capsule Take 1 capsule (72 mcg total) by mouth daily before breakfast.   methylphenidate (CONCERTA) 54 MG PO CR tablet Take 1 tablet (54 mg total) by mouth every morning.   NOVOFINE PEN NEEDLE 32G X 6 MM MISC Use to inject Victoza once daily   NYSTATIN powder Apply topically.   pioglitazone (ACTOS) 30 MG tablet Take 1 tablet (30 mg total) by mouth daily.   tirzepatide [facility]) 5 MG/0.5ML Pen Inject 5 mg into the skin once a week.   tiZANidine (ZANAFLEX) 4 MG tablet Take 4 mg by mouth 2 (two) times daily.   Vitamin D, Ergocalciferol, (DRISDOL) 1.25 MG (50000 UNIT) CAPS capsule Take 50,000 Units by mouth every 7 (seven) days.   [DISCONTINUED] fenofibrate  (TRICOR) 145 MG tablet TAKE 1 TABLET BY MOUTH ONCE DAILY.   [DISCONTINUED] glipiZIDE (GLUCOTROL) 5 MG tablet Take 1 tablet (5 mg total) by mouth 2 (two) times daily before a meal.   [DISCONTINUED] levocetirizine (XYZAL) 5 MG tablet Take 5 mg by mouth at bedtime.   [DISCONTINUED] levothyroxine (SYNTHROID) 75 MCG tablet TAKE 1 TABLET BY MOUTH ONCE DAILY.   [DISCONTINUED] methylphenidate (CONCERTA) 54 MG PO CR tablet Take 1 tablet (54 mg total) by mouth every morning.   No facility-administered encounter medications on file as of 07/08/2022.    ALLERGIES: No Known Allergies  VACCINATION STATUS: Immunization History  Administered Date(s) Administered   Tdap 01/28/2017    Diabetes She presents for her follow-up diabetic visit. She has type 2 diabetes mellitus. Onset time: She was diagnosed at approximate age of 46 years. Her disease course has been improving. Hypoglycemia symptoms include sweats and tremors. Pertinent negatives for hypoglycemia include no confusion, headaches, nervousness/anxiousness, pallor or seizures. Associated symptoms include fatigue. Pertinent negatives for diabetes include no chest pain, no foot ulcerations, no polydipsia, no polyphagia, no polyuria, no visual change and no weight loss. There are no hypoglycemic complications. Symptoms are improving. There are no diabetic complications. Risk factors for coronary artery disease include diabetes mellitus, dyslipidemia, obesity, sedentary lifestyle, tobacco exposure and hypertension. Current diabetic treatment includes oral agent (dual therapy) (and Mounjaro). She is compliant with treatment most of the time. Her weight is fluctuating minimally. She is following a generally unhealthy diet. When asked about meal planning, she reported none. She has not had a previous visit with a dietitian. She rarely participates in exercise. Her home blood glucose trend is decreasing steadily. Her breakfast blood glucose range is generally 90-110  mg/dl. Her bedtime blood glucose range is generally 130-140 mg/dl. (She presents today with her logs, no meter, showing tightening glycemic profile overall.  Her POCT A1c today is 5.9%, improving from last visit of 6.1%.  She reports she still is under great amount of stress but is doing well overall.  She does have some tight fasting readings since starting the [facility].) An ACE inhibitor/angiotensin II receptor blocker is not being taken. She does not see a podiatrist.Eye exam is current.  Hyperlipidemia This is a chronic problem. The current episode started more than 1 year ago. The problem  is uncontrolled (yet improving). Recent lipid tests were reviewed and are high. Exacerbating diseases include diabetes, hypothyroidism and obesity. Factors aggravating her hyperlipidemia include fatty foods and smoking. Pertinent negatives include no chest pain, myalgias or shortness of breath. Current antihyperlipidemic treatment includes statins and fibric acid derivatives. The current treatment provides moderate improvement of lipids. Compliance problems include adherence to diet and adherence to exercise.  Risk factors for coronary artery disease include diabetes mellitus, dyslipidemia, a sedentary lifestyle, obesity and hypertension.    Review of systems  Constitutional: + Minimally fluctuating body weight,  current Body mass index is 37.84 kg/m. , + fatigue, no subjective hyperthermia, no subjective hypothermia Eyes: no blurry vision, no xerophthalmia ENT: no sore throat, no nodules palpated in throat, no dysphagia/odynophagia, no hoarseness Cardiovascular: no chest pain, no shortness of breath, no palpitations, no leg swelling Respiratory: no cough, no shortness of breath Gastrointestinal: no nausea/vomiting/diarrhea Musculoskeletal: no muscle/joint aches Skin: no rashes, no hyperemia Neurological: no tremors, no numbness, no tingling, no dizziness Psychiatric: no depression, no anxiety, hx bipolar-  currently controlled on meds  Objective:     BP 114/78   Pulse (!) 109   Ht 5' 3.5" (1.613 m)   Wt 217 lb (98.4 kg)   BMI 37.84 kg/m   Wt Readings from Last 3 Encounters:  07/08/22 217 lb (98.4 kg)  05/03/22 219 lb 9.6 oz (99.6 kg)  02/28/22 220 lb (99.8 kg)    BP Readings from Last 3 Encounters:  07/08/22 114/78  05/03/22 (!) 144/88  02/28/22 122/79     Physical Exam- Limited  Constitutional:  Body mass index is 37.84 kg/m. , not in acute distress, normal state of mind Eyes:  EOMI, no exophthalmos Neck: Supple Cardiovascular: RRR, no murmurs, rubs, or gallops, no edema Respiratory: Adequate breathing efforts, no crackles, rales, rhonchi, or wheezing Musculoskeletal: no gross deformities, strength intact in all four extremities, no gross restriction of joint movements Skin:  no rashes, no hyperemia Neurological: no tremor with outstretched hands   Diabetic Foot Exam - Simple   Simple Foot Form Diabetic Foot exam was performed with the following findings: Yes 07/08/2022  9:35 AM  Visual Inspection No deformities, no ulcerations, no other skin breakdown bilaterally: Yes Sensation Testing Intact to touch and monofilament testing bilaterally: Yes Pulse Check Posterior Tibialis and Dorsalis pulse intact bilaterally: Yes Comments Reports she has trouble with ingrown toenails and is wanting diabetic footwear- informed patient she can make appt with podiatrist of her choice as they do not require a referral.    CMP ( most recent) CMP     Component Value Date/Time   NA 142 02/27/2022 0839   K 4.4 02/27/2022 0839   CL 104 02/27/2022 0839   CO2 23 02/27/2022 0839   GLUCOSE 85 02/27/2022 0839   GLUCOSE 89 07/14/2014 0958   BUN 10 02/27/2022 0839   CREATININE 0.74 02/27/2022 0839   CREATININE 0.75 07/14/2014 0958   CALCIUM 9.7 02/27/2022 0839   PROT 6.7 02/27/2022 0839   ALBUMIN 4.3 02/27/2022 0839   AST 19 02/27/2022 0839   ALT 21 02/27/2022 0839   ALKPHOS 112  02/27/2022 0839   BILITOT <0.2 02/27/2022 0839   GFRNONAA 80 10/10/2020 0000     Diabetic Labs (most recent): Lab Results  Component Value Date   HGBA1C 5.9 07/08/2022   HGBA1C 6.1 02/28/2022   HGBA1C 6.5 08/08/2021   MICROALBUR 10 07/08/2022   MICROALBUR 10 01/24/2021     Lipid Panel  Component Value Date/Time   CHOL 221 (H) 10/17/2021 1008   TRIG 329 (H) 10/17/2021 1008   HDL 39 (L) 10/17/2021 1008   CHOLHDL 5.7 (H) 10/17/2021 1008   LDLCALC 124 (H) 10/17/2021 1008   LABVLDL 58 (H) 10/17/2021 1008      Assessment & Plan:   1) Type 2 diabetes mellitus without complication, without long-term current use of insulin (HCC)  - Catherine DuckingMichelle Wilton has currently uncontrolled symptomatic type 2 DM since  46 years of age.   She presents today with her logs, no meter, showing tightening glycemic profile overall.  Her POCT A1c today is 5.9%, improving from last visit of 6.1%.  She reports she still is under great amount of stress but is doing well overall.  She does have some tight fasting readings [facility]  - I had a long discussion with her about the progressive nature of diabetes and the pathology behind its complications. -her diabetes is complicated by obesity/sedentary life, chronic  Smoking, sleep apnea and she remains at a high risk for more acute and chronic complications which include CAD, CVA, CKD, retinopathy, and neuropathy. These are all discussed in detail with her.  - Nutritional counseling repeated at each appointment due to patients tendency to fall back in to old habits.  - The patient admits there is a room for improvement in their diet and drink choices. -  Suggestion is made for the patient to avoid simple carbohydrates from their diet including Cakes, Sweet Desserts / Pastries, Ice Cream, Soda (diet and regular), Sweet Tea, Candies, Chips, Cookies, Sweet Pastries, Store Bought Juices, Alcohol in Excess of 1-2 drinks a day, Artificial  Sweeteners, Coffee Creamer, and "Sugar-free" Products. This will help patient to have stable blood glucose profile and potentially avoid unintended weight gain.   - I encouraged the patient to switch to unprocessed or minimally processed complex starch and increased protein intake (animal or plant source), fruits, and vegetables.   - Patient is advised to stick to a routine mealtimes to eat 3 meals a day and avoid unnecessary snacks (to snack only to correct hypoglycemia).  -[facility] for assistance with glucose management and weight loss but has failed to schedule appt with her.  - I have approached her with the following individualized plan to manage  her diabetes and patient agrees:   -She is advised to continue her Mounjaro 5 mg SQ weekly (hesitant to increase given history of smoking and elevated triglycerides).  Will stop her Glipizide today.  She can continue her Actos 30 mg po daily for now (may consider stopping at next visit as well).   -She is advised to continue monitoring glucose at least twice daily, before breakfast and before bed, and call the clinic if she has readings less than 70 or greater than 300 for 3 tests in a row.  - Specific targets for  A1c;  LDL, HDL,  and Triglycerides were discussed with the patient.  2) Blood Pressure /Hypertension: Her blood pressure is controlled to target.  She is not currently on any antihypertensive medications at this time.  If BP is elevated on 3 separate occasions, she will be considered for low dose ACE/ARB for renal protection from DM.  3) Lipids/Hyperlipidemia:  Her most recent lipid panel from 10/17/21 shows uncontrolled LDL of 124 and elevated triglycerides of 329.  She is advised to continue Crestor 20 mg po daily at bedtime and Fenofibrate 145 mg po daily.  Side effects and precautions discussed with her.  She is also advised to avoid fried foods and butter.  She has labs coming up with her PCP, I  asked she have a copy sent to [place] for our records.  4)  Weight/Diet:  Her Body mass index is 37.84 kg/m.-   clearly complicating her diabetes care.   she is  a candidate for weight loss. I discussed with her the fact that loss of 5 - 10% of her  current body weight will have the most impact on her diabetes management.  Exercise, and detailed carbohydrates information provided  -  detailed on discharge instructions.  Have set up dietician appt to discuss weight loss.  5) Hypothyroidism- unspecified There are no recent TFTs to review.  She is advised to continue her Levothyroxine 75 mcg po daily before breakfast.  - The correct intake of thyroid hormone (Levothyroxine, Synthroid), is on empty stomach first thing in the morning, with water, separated by at least 30 minutes from breakfast and other medications,  and separated by more than 4 hours from calcium, iron, multivitamins, acid reflux medications (PPIs).  - This medication is a life-long medication and will be needed to correct thyroid hormone imbalances for the rest of your life.  The dose may change from time to time, based on thyroid blood work.  - It is extremely important to be consistent taking this medication, near the same time each morning.  -AVOID TAKING PRODUCTS CONTAINING BIOTIN (commonly found in Hair, Skin, Nails vitamins) AS IT INTERFERES WITH THE VALIDITY OF THYROID FUNCTION BLOOD TESTS.  6) Vitamin D Deficiency Her most recent vitamin D level was 30 on 13/8/23.  She is currently taking Ergocalciferol 50000 units weekly, she is advised to continue.  7) Chronic Care/Health Maintenance: -she is on Statin medications and  is encouraged to initiate and continue to follow up with Ophthalmology, Dentist, Podiatrist at least yearly or according to recommendations, and advised to  Quit smoking. I have recommended yearly flu vaccine and pneumonia vaccine at least every 5 years; moderate intensity exercise for up to 150 minutes weekly;  and  sleep for at least 7 hours a day.  Smoking cessation instruction/counseling given:  counseled patient on the dangers of tobacco use, advised patient to stop smoking, and reviewed strategies to maximize success  - she is advised to maintain close follow up with Smith Robert, MD for primary care needs, as well as her other providers for optimal and coordinated care.      I spent 40 minutes in the care of the patient today including review of labs from CMP, Lipids, Thyroid Function, Hematology (current and previous including abstractions from other facilities); face-to-face time discussing  her blood glucose readings/logs, discussing hypoglycemia and hyperglycemia episodes and symptoms, medications doses, her options of short and long term treatment based on the latest standards of care / guidelines;  discussion about incorporating lifestyle medicine;  and documenting the encounter. Risk reduction counseling performed per USPSTF guidelines to reduce obesity and cardiovascular risk factors.     Please refer to Patient Instructions for Blood Glucose Monitoring and Insulin/Medications Dosing Guide"  in media tab for additional information. Please  also refer to " Patient Self Inventory" in the Media  tab for reviewed elements of pertinent patient history.  Catherine Howard participated in the discussions, expressed understanding, and voiced agreement with the above plans.  All questions were answered to her satisfaction. she is encouraged to contact clinic should she have any questions  or concerns prior to her return visit.    Follow up plan: - Return in about 4 months (around 11/08/2022) for Diabetes F/U with A1c in office, No previsit labs, Bring meter and logs.  Ronny Bacon, [facility] [address] [place], [place] [address] Phone: [phone] Fax: [phone]  07/08/2022, 9:38 AM

## 2022-07-08 NOTE — Patient Instructions (Signed)

## 2022-07-25 ENCOUNTER — Encounter (INDEPENDENT_AMBULATORY_CARE_PROVIDER_SITE_OTHER): Payer: Self-pay | Admitting: Gastroenterology

## 2022-07-25 ENCOUNTER — Ambulatory Visit (INDEPENDENT_AMBULATORY_CARE_PROVIDER_SITE_OTHER): Payer: Self-pay | Admitting: Gastroenterology

## 2022-07-25 ENCOUNTER — Encounter (INDEPENDENT_AMBULATORY_CARE_PROVIDER_SITE_OTHER): Payer: Self-pay

## 2022-08-06 ENCOUNTER — Telehealth: Payer: Self-pay | Admitting: Nurse Practitioner

## 2022-08-06 NOTE — Telephone Encounter (Signed)
Will await results

## 2022-08-06 NOTE — Telephone Encounter (Signed)
Pt said that she wants her Fenofibrate increased because her cholesterol labs were still high. She will have those results sent to [place]

## 2022-08-06 NOTE — Telephone Encounter (Signed)
Patient made aware that Alphonzo Lemmings said she needs to work on her diet and she is not increasing her medication.  Whitney - she said she is not eating many fried foods.

## 2022-08-12 NOTE — Telephone Encounter (Signed)
Patient called back and said she is not eating many fried foods and it is still staying high. Please advise.

## 2022-08-12 NOTE — Telephone Encounter (Signed)
She is already on Fenofibrate and Crestor which should help with it over time.

## 2022-08-29 ENCOUNTER — Encounter (HOSPITAL_COMMUNITY): Payer: Self-pay | Admitting: Psychiatry

## 2022-08-29 ENCOUNTER — Telehealth (INDEPENDENT_AMBULATORY_CARE_PROVIDER_SITE_OTHER): Payer: No Typology Code available for payment source | Admitting: Psychiatry

## 2022-08-29 DIAGNOSIS — F3162 Bipolar disorder, current episode mixed, moderate: Secondary | ICD-10-CM | POA: Diagnosis not present

## 2022-08-29 DIAGNOSIS — F902 Attention-deficit hyperactivity disorder, combined type: Secondary | ICD-10-CM | POA: Diagnosis not present

## 2022-08-29 MED ORDER — ALPRAZOLAM 1 MG PO TABS: 1.0000 mg | ORAL_TABLET | Freq: Three times a day (TID) | ORAL | 2 refills | Status: DC

## 2022-08-29 MED ORDER — CARIPRAZINE HCL 3 MG PO CAPS
ORAL_CAPSULE | ORAL | 3 refills | Status: DC
Start: 2022-08-29 — End: 2022-09-26

## 2022-08-29 MED ORDER — DULOXETINE HCL 60 MG PO CPEP: 60.0000 mg | ORAL_CAPSULE | Freq: Two times a day (BID) | ORAL | 2 refills | Status: DC

## 2022-08-29 MED ORDER — METHYLPHENIDATE HCL ER (OSM) 54 MG PO TBCR: 54.0000 mg | EXTENDED_RELEASE_TABLET | ORAL | 0 refills | Status: DC

## 2022-08-29 MED ORDER — DAYVIGO 10 MG PO TABS
10.0000 mg | ORAL_TABLET | Freq: Every day | ORAL | 2 refills | Status: DC
Start: 2022-08-29 — End: 2022-09-26

## 2022-08-29 NOTE — Progress Notes (Signed)
Virtual Visit via Telephone Note  I connected with Catherine Howard on 08/29/22 at  9:40 AM EDT by telephone and verified that I am speaking with the correct person using two identifiers.  Location: Patient: home Provider: office   I discussed the limitations, risks, security and privacy concerns of performing an evaluation and management service by telephone and the availability of in person appointments. I also discussed with the patient that there may be a patient responsible charge related to this service. The patient expressed understanding and agreed to proceed.      I discussed the assessment and treatment plan with the patient. The patient was provided an opportunity to ask questions and all were answered. The patient agreed with the plan and demonstrated an understanding of the instructions.   The patient was advised to call back or seek an in-person evaluation if the symptoms worsen or if the condition fails to improve as anticipated.  I provided 15 minutes of non-face-to-face time during this encounter.   [name], MD  [facility] MD/PA/NP OP Progress Note  08/29/2022 9:57 AM Catherine Howard  MRN:  [id number]  Chief Complaint:  Chief Complaint  Patient presents with   Depression   Anxiety   Follow-up   HPI: This patient is a 46 year old divorced white female who lives with her fianc, 70 year old daughter and 23 year old son in [place]. Her 23 year old son died of a drug overdose in Apr 26, 2013. The patient does not work and is on disability  The patient returns for follow-up after 2 months regarding her bipolar disorder anxiety and insomnia.  She states she has been under a lot more stress lately.  She has been feeling a heaviness in her chest.  Primary care has done an EKG which she states was normal.  She has been referred to cardiology.  She states that she is not sleeping.  She is very worried about her father who has cancer and refused chemotherapy and radiation.  She states  whenever she tries to visit him she and her mother get in a fight.  She states that her mother criticizes her and states that everything she does is wrong.  I urged her to talk to the mother so they can come to terms so she can visit her father.  She claims she is trying to do this.  She has been on numerous sleeping medicines including Ambien and temazepam Belsomra trazodone and now [place].  There is a new medicine called Davigo and we can try it.  She denies any thoughts of self-harm or suicide but she does seem quite stressed. Visit Diagnosis:    ICD-10-CM   1. Bipolar 1 disorder, mixed, moderate (HCC)  F31.62     2. Attention deficit hyperactivity disorder (ADHD), combined type  F90.2       Past Psychiatric History: Outpatient treatment for the last several years  Past Medical History:  Past Medical History:  Diagnosis Date   ADHD    Bipolar I disorder (HCC)    Elevated cholesterol    GERD (gastroesophageal reflux disease)    Thyroid disease    Type 2 diabetes mellitus (HCC)    Vitamin D deficiency     Past Surgical History:  Procedure Laterality Date   CESAREAN SECTION     DILATION AND CURETTAGE OF UTERUS      Family Psychiatric History: See below  Family History:  Family History  Problem Relation Age of Onset   Schizophrenia Father    Alcohol abuse Father  Diabetes Father    Hyperlipidemia Father    Alcohol abuse Paternal Uncle     Social History:  Social History   Socioeconomic History   Marital status: Single    Spouse name: Not on file   Number of children: Not on file   Years of education: Not on file   Highest education level: Not on file  Occupational History   Not on file  Tobacco Use   Smoking status: Every Day    Packs/day: 2.00    Years: 32.00    Total pack years: 64.00    Types: Cigarettes   Smokeless tobacco: Never   Tobacco comments:    smokes 3/4 of a pack a day--01/22/2021  Vaping Use   Vaping Use: Former  Substance and Sexual  Activity   Alcohol use: No    Alcohol/week: 0.0 standard drinks of alcohol   Drug use: No   Sexual activity: Yes    Partners: Male    Birth control/protection: None  Other Topics Concern   Not on file  Social History Narrative   Not on file   Social Determinants of Health   Financial Resource Strain: Not on file  Food Insecurity: Not on file  Transportation Needs: Not on file  Physical Activity: Not on file  Stress: Not on file  Social Connections: Not on file    Allergies: No Known Allergies  Metabolic Disorder Labs: Lab Results  Component Value Date   HGBA1C 5.9 07/08/2022   No results found for: "PROLACTIN" Lab Results  Component Value Date   CHOL 221 (H) 10/17/2021   TRIG 329 (H) 10/17/2021   HDL 39 (L) 10/17/2021   CHOLHDL 5.7 (H) 10/17/2021   LDLCALC 124 (H) 10/17/2021   LDLCALC 124 10/17/2021   Lab Results  Component Value Date   TSH 1.670 02/27/2022   TSH 0.575 10/17/2021    Therapeutic Level Labs: Lab Results  Component Value Date   LITHIUM 0.40 (L) 07/14/2014   No results found for: "VALPROATE" No results found for: "CBMZ"  Current Medications: Current Outpatient Medications  Medication Sig Dispense Refill   Lemborexant (DAYVIGO) 10 MG TABS Take 10 mg by mouth at bedtime. 30 tablet 2   ACCU-CHEK AVIVA PLUS test strip SMARTSIG:Via Meter     Accu-Chek Softclix Lancets lancets SMARTSIG:Topical     ALPRAZolam (XANAX) 1 MG tablet Take 1 tablet (1 mg total) by mouth 3 (three) times daily. 90 tablet 2   Azelastine-Fluticasone 137-50 MCG/ACT SUSP Place 1 spray into the nose in the morning and at bedtime. One spray each nostril two times daily     butalbital-acetaminophen-caffeine (FIORICET) 50-325-40 MG tablet Take by mouth. (Patient not taking: Reported on 07/08/2022)     cariprazine (VRAYLAR) 3 MG capsule TAKE (1) CAPSULE BY MOUTH ONCE DAILY. 30 capsule 3   DULoxetine (CYMBALTA) 60 MG capsule Take 1 capsule (60 mg total) by mouth 2 (two) times daily.  180 capsule 2   esomeprazole (NEXIUM) 40 MG capsule Take 30- 60 min before your first and last meals of the day 60 capsule 2   fenofibrate (TRICOR) 145 MG tablet Take 1 tablet (145 mg total) by mouth daily. 90 tablet 3   ipratropium (ATROVENT HFA) 17 MCG/ACT inhaler Inhale 2 puffs into the lungs every 6 (six) hours as needed for wheezing. 1 each 11   levocetirizine (XYZAL) 5 MG tablet 1 tablet in the evening     levothyroxine (SYNTHROID) 75 MCG tablet Take 1 tablet (75 mcg total) by  mouth daily. 90 tablet 3   linaclotide (LINZESS) 72 MCG capsule Take 1 capsule (72 mcg total) by mouth daily before breakfast. 90 capsule 1   methylphenidate (CONCERTA) 54 MG PO CR tablet Take 1 tablet (54 mg total) by mouth every morning. 30 tablet 0   NOVOFINE PEN NEEDLE 32G X 6 MM MISC Use to inject Victoza once daily 100 each 3   NYSTATIN powder Apply topically.     pioglitazone (ACTOS) 30 MG tablet Take 1 tablet (30 mg total) by mouth daily. 90 tablet 3   tirzepatide (MOUNJARO) 5 MG/0.5ML Pen Inject 5 mg into the skin once a week. 6 mL 3   tiZANidine (ZANAFLEX) 4 MG tablet Take 4 mg by mouth 2 (two) times daily.     Vitamin D, Ergocalciferol, (DRISDOL) 1.25 MG (50000 UNIT) CAPS capsule Take 50,000 Units by mouth every 7 (seven) days.     No current facility-administered medications for this visit.     Musculoskeletal: Strength & Muscle Tone: na Gait & Station: na Patient leans: N/A  Psychiatric Specialty Exam: Review of Systems  Respiratory:  Positive for chest tightness.   Psychiatric/Behavioral:  Positive for sleep disturbance. The patient is nervous/anxious.   All other systems reviewed and are negative.   There were no vitals taken for this visit.There is no height or weight on file to calculate BMI.  General Appearance: NA  Eye Contact:  NA  Speech:  Clear and Coherent  Volume:  Normal  Mood:  Anxious  Affect:  NA  Thought Process:  Goal Directed  Orientation:  Full (Time, Place, and  Person)  Thought Content: Rumination   Suicidal Thoughts:  No  Homicidal Thoughts:  No  Memory:  Immediate;   Good Recent;   Good Remote;   Good  Judgement:  Good  Insight:  Fair  Psychomotor Activity:  Decreased  Concentration:  Concentration: Good and Attention Span: Good  Recall:  Good  Fund of Knowledge: Good  Language: Good  Akathisia:  No  Handed:  Right  AIMS (if indicated): not done  Assets:  Communication Skills Desire for Improvement Resilience Social Support Talents/Skills  ADL's:  Intact  Cognition: WNL  Sleep:  Poor   Screenings: PHQ2-9    Flowsheet Row [facility] Video Visit from 07/04/2022 in [facility] Video Visit from 06/05/2022 in [facility] Video Visit from 04/22/2022 in [facility] Video Visit from 01/23/2022 in [facility]  PHQ-2 Total Score 1 1 1 1 1       Flowsheet Row Video Visit from 08[facility]29/2022 [facility] Video Visit from 07/04/2022 in [facility] Video Visit from 06/05/2022 [facility]  C-SSRS RISK CATEGORY No Risk No Risk No Risk        Assessment and Plan: This patient is a 46 year old female with a history of bipolar disorder anxiety and ADHD.  She seems to be very stressed regarding her father's illness and her mother's hostility by her report.  I urged her to try to talk to the mother.  Since she is not sleeping with the Lunesta we will change to Davigo 10 mg at bedtime for sleep.  She will continue Xanax 1 mg 3 times daily for anxiety, Concerta 54 mg every morning for ADD, Cymbalta 60 mg twice daily for depression and Vraylar 3 mg daily for mood  stabilization.  She will return to  see me in 4 weeks  Collaboration of Care: Collaboration of Care: Primary Care Provider AEB notes will be shared with PCP at patient's request  Patient/Guardian was advised Release of Information must be obtained prior to any record release in order to collaborate their care with an outside provider. Patient/Guardian was advised if they have not already done so to contact the registration department to sign all necessary forms in order for [place] to release information regarding their care.   Consent: Patient/Guardian gives verbal consent for treatment and assignment of benefits for services provided during this visit. Patient/Guardian expressed understanding and agreed to proceed.    [name], MD 08/29/2022, 9:57 AM

## 2022-09-11 ENCOUNTER — Telehealth: Payer: Self-pay | Admitting: Nurse Practitioner

## 2022-09-11 NOTE — Telephone Encounter (Signed)
New message    The patient did not disclose any information asking for a call back.

## 2022-09-12 NOTE — Telephone Encounter (Signed)
Patient was called and a message was left, asking that she send Whitney a message through My Chart.

## 2022-09-12 NOTE — Telephone Encounter (Signed)
F/u   The patient did not disclose any information only wants NP to call her back

## 2022-09-12 NOTE — Telephone Encounter (Signed)
I see she is active on MyChart.  Is there any way we can encourage her to reach out to me on there and ask me her questions or voice concerns?  It is easier for me to respond to those between patients, that way I can get back with her in a timely manner.

## 2022-09-13 ENCOUNTER — Telehealth (HOSPITAL_COMMUNITY): Payer: Self-pay | Admitting: *Deleted

## 2022-09-13 ENCOUNTER — Encounter: Payer: Self-pay | Admitting: Nurse Practitioner

## 2022-09-13 NOTE — Telephone Encounter (Signed)
Taht s not true, she just cannot tKE THEM AT THE SAME time

## 2022-09-13 NOTE — Telephone Encounter (Signed)
Patient called stating that the pharmacy is still waiting on PA for her new sleeping medication. Staff informed patient that PA was faxed into insurance and she would have to wait.    Patient then stated that the pharmacy informed her that if and when she starts this new sleeping medication, patient would have to stop taking her Xanax and provider did not tell her that.  Patient would like provider Advise.

## 2022-09-14 MED ORDER — TIRZEPATIDE 7.5 MG/0.5ML ~~LOC~~ SOAJ: 7.5000 mg | SUBCUTANEOUS | 1 refills | Status: DC

## 2022-09-16 NOTE — Telephone Encounter (Signed)
Staff informed Catherine Howard with what provider stated.

## 2022-09-16 NOTE — Telephone Encounter (Signed)
Staff informed patient and she verbalized understanding.

## 2022-09-18 ENCOUNTER — Other Ambulatory Visit (HOSPITAL_COMMUNITY): Payer: Self-pay

## 2022-09-18 ENCOUNTER — Telehealth: Payer: Self-pay

## 2022-09-18 NOTE — Telephone Encounter (Signed)
Patient was called and made aware that the [facility] had been approved. A prescription was sent to the [facility] on 09/14/2022.

## 2022-09-18 NOTE — Telephone Encounter (Signed)
Great news!  Her Catherine Howard was approved!  Will you call and let her know?  Thanks

## 2022-09-18 NOTE — Telephone Encounter (Signed)
Received notification from [place] regarding a prior authorization for MOUNJARO 7.5MG /0.5ML. Authorization has been APPROVED from 09-18-2022 to 09-18-2023.   Authorization # [id number]

## 2022-09-19 DIAGNOSIS — F17200 Nicotine dependence, unspecified, uncomplicated: Secondary | ICD-10-CM | POA: Insufficient documentation

## 2022-09-19 DIAGNOSIS — R0789 Other chest pain: Secondary | ICD-10-CM | POA: Insufficient documentation

## 2022-09-19 DIAGNOSIS — R7303 Prediabetes: Secondary | ICD-10-CM | POA: Insufficient documentation

## 2022-09-26 ENCOUNTER — Telehealth (INDEPENDENT_AMBULATORY_CARE_PROVIDER_SITE_OTHER): Payer: No Typology Code available for payment source | Admitting: Psychiatry

## 2022-09-26 ENCOUNTER — Encounter (HOSPITAL_COMMUNITY): Payer: Self-pay | Admitting: Psychiatry

## 2022-09-26 ENCOUNTER — Ambulatory Visit (INDEPENDENT_AMBULATORY_CARE_PROVIDER_SITE_OTHER): Payer: Medicaid Other | Admitting: Gastroenterology

## 2022-09-26 DIAGNOSIS — F902 Attention-deficit hyperactivity disorder, combined type: Secondary | ICD-10-CM | POA: Diagnosis not present

## 2022-09-26 DIAGNOSIS — F3162 Bipolar disorder, current episode mixed, moderate: Secondary | ICD-10-CM | POA: Diagnosis not present

## 2022-09-26 MED ORDER — METHYLPHENIDATE HCL ER (OSM) 54 MG PO TBCR: 54.0000 mg | EXTENDED_RELEASE_TABLET | ORAL | 0 refills | Status: DC

## 2022-09-26 MED ORDER — DAYVIGO 10 MG PO TABS: 10.0000 mg | ORAL_TABLET | Freq: Every day | ORAL | 2 refills | Status: DC

## 2022-09-26 MED ORDER — ALPRAZOLAM 1 MG PO TABS
1.0000 mg | ORAL_TABLET | Freq: Three times a day (TID) | ORAL | 2 refills | Status: DC
Start: 2022-09-26 — End: 2023-01-14

## 2022-09-26 MED ORDER — CARIPRAZINE HCL 3 MG PO CAPS: ORAL_CAPSULE | ORAL | 3 refills | Status: DC

## 2022-09-26 MED ORDER — DULOXETINE HCL 60 MG PO CPEP
60.0000 mg | ORAL_CAPSULE | Freq: Two times a day (BID) | ORAL | 2 refills | Status: DC
Start: 2022-09-26 — End: 2023-01-14

## 2022-09-26 NOTE — Progress Notes (Signed)
Virtual Visit via Telephone Note  I connected with Catherine Howard on 09/26/22 at 10:00 AM EDT by telephone and verified that I am speaking with the correct person using two identifiers.  Location: Patient: home Provider: office   I discussed the limitations, risks, security and privacy concerns of performing an evaluation and management service by telephone and the availability of in person appointments. I also discussed with the patient that there may be a patient responsible charge related to this service. The patient expressed understanding and agreed to proceed.     I discussed the assessment and treatment plan with the patient. The patient was provided an opportunity to ask questions and all were answered. The patient agreed with the plan and demonstrated an understanding of the instructions.   The patient was advised to call back or seek an in-person evaluation if the symptoms worsen or if the condition fails to improve as anticipated.  I provided 12 minutes of non-face-to-face time during this encounter.   [name], MD  [facility] MD/PA/NP OP Progress Note  09/26/2022 10:23 AM Catherine Howard  MRN:  [id number]  Chief Complaint:  Chief Complaint  Patient presents with   Depression   Anxiety   Manic Behavior   Follow-up   HPI: This patient is a 46 year old divorced white female who lives with her fianc, 57 year old daughter and 76 year old son in [place]. Her 61 year old son died of a drug overdose in 04/07/2013. The patient does not work and is on disability  The patient returns for follow-up after 1 month regarding her bipolar disorder anxiety and insomnia.  We did start Davigo for insomnia and she is sleeping much better now.  Consequently her mood has improved considerably and she denies significant depression anxiety racing thoughts or other manic symptoms.  She denies thoughts of self-harm or suicide.  Her energy is good.  She has been seen by cardiology and will be undergoing more  testing for the atypical chest pain.  So far her EKG has been normal.  She still smokes about 30 cigarettes a day and I urged her to slowly try to cut this down.  She did not have a good response to [facility] Visit Diagnosis:    ICD-10-CM   1. Bipolar 1 disorder, mixed, moderate (HCC)  F31.62     2. Attention deficit hyperactivity disorder (ADHD), combined type  F90.2       Past Psychiatric History: Outpatient treatment for the last several years  Past Medical History:  Past Medical History:  Diagnosis Date   ADHD    Bipolar I disorder (HCC)    Elevated cholesterol    GERD (gastroesophageal reflux disease)    Thyroid disease    Type 2 diabetes mellitus (HCC)    Vitamin D deficiency     Past Surgical History:  Procedure Laterality Date   CESAREAN SECTION     DILATION AND CURETTAGE OF UTERUS      Family Psychiatric History: See below  Family History:  Family History  Problem Relation Age of Onset   Schizophrenia Father    Alcohol abuse Father    Diabetes Father    Hyperlipidemia Father    Alcohol abuse Paternal Uncle     Social History:  Social History   Socioeconomic History   Marital status: Single    Spouse name: Not on file   Number of children: Not on file   Years of education: Not on file   Highest education level: Not on file  Occupational History  Not on file  Tobacco Use   Smoking status: Every Day    Packs/day: 2.00    Years: 32.00    Total pack years: 64.00    Types: Cigarettes   Smokeless tobacco: Never   Tobacco comments:    smokes 3/4 of a pack a day--01/22/2021  Vaping Use   Vaping Use: Former  Substance and Sexual Activity   Alcohol use: No    Alcohol/week: 0.0 standard drinks of alcohol   Drug use: No   Sexual activity: Yes    Partners: Male    Birth control/protection: None  Other Topics Concern   Not on file  Social History Narrative   Not on file   Social Determinants of Health   Financial Resource Strain: Not on file  Food  Insecurity: Not on file  Transportation Needs: Not on file  Physical Activity: Not on file  Stress: Not on file  Social Connections: Not on file    Allergies: No Known Allergies  Metabolic Disorder Labs: Lab Results  Component Value Date   HGBA1C 5.9 07/08/2022   No results found for: "PROLACTIN" Lab Results  Component Value Date   CHOL 221 (H) 10/17/2021   TRIG 329 (H) 10/17/2021   HDL 39 (L) 10/17/2021   CHOLHDL 5.7 (H) 10/17/2021   LDLCALC 124 (H) 10/17/2021   [place] 124 10/17/2021   Lab Results  Component Value Date   TSH 1.670 02/27/2022   TSH 0.575 10/17/2021    Therapeutic Level Labs: Lab Results  Component Value Date   LITHIUM 0.40 (L) 07/14/2014   No results found for: "VALPROATE" No results found for: "CBMZ"  Current Medications: Current Outpatient Medications  Medication Sig Dispense Refill   methylphenidate (CONCERTA) 54 MG PO CR tablet Take 1 tablet (54 mg total) by mouth every morning. 30 tablet 0   methylphenidate (CONCERTA) 54 MG PO CR tablet Take 1 tablet (54 mg total) by mouth every morning. 30 tablet 0   ACCU-CHEK AVIVA PLUS test strip SMARTSIG:Via Meter     Accu-Chek Softclix Lancets lancets SMARTSIG:Topical     ALPRAZolam (XANAX) 1 MG tablet Take 1 tablet (1 mg total) by mouth 3 (three) times daily. 90 tablet 2   Azelastine-Fluticasone 137-50 MCG/ACT SUSP Place 1 spray into the nose in the morning and at bedtime. One spray each nostril two times daily     butalbital-acetaminophen-caffeine (FIORICET) 50-325-40 MG tablet Take by mouth. (Patient not taking: Reported on 07/08/2022)     cariprazine (VRAYLAR) 3 MG capsule TAKE (1) CAPSULE BY MOUTH ONCE DAILY. 30 capsule 3   DULoxetine (CYMBALTA) 60 MG capsule Take 1 capsule (60 mg total) by mouth 2 (two) times daily. 180 capsule 2   esomeprazole (NEXIUM) 40 MG capsule Take 30- 60 min before your first and last meals of the day 60 capsule 2   fenofibrate (TRICOR) 145 MG tablet Take 1 tablet (145 mg  total) by mouth daily. 90 tablet 3   ipratropium (ATROVENT HFA) 17 MCG/ACT inhaler Inhale 2 puffs into the lungs every 6 (six) hours as needed for wheezing. 1 each 11   Lemborexant (DAYVIGO) 10 MG TABS Take 10 mg by mouth at bedtime. 30 tablet 2   levocetirizine (XYZAL) 5 MG tablet 1 tablet in the evening     levothyroxine (SYNTHROID) 75 MCG tablet Take 1 tablet (75 mcg total) by mouth daily. 90 tablet 3   linaclotide (LINZESS) 72 MCG capsule Take 1 capsule (72 mcg total) by mouth daily before breakfast. 90 capsule  1   methylphenidate (CONCERTA) 54 MG PO CR tablet Take 1 tablet (54 mg total) by mouth every morning. 30 tablet 0   NOVOFINE PEN NEEDLE 32G X 6 MM MISC Use to inject Victoza once daily 100 each 3   NYSTATIN powder Apply topically.     pioglitazone (ACTOS) 30 MG tablet Take 1 tablet (30 mg total) by mouth daily. 90 tablet 3   tirzepatide (MOUNJARO) 7.5 MG/0.5ML Pen Inject 7.5 mg into the skin once a week. 6 mL 1   tiZANidine (ZANAFLEX) 4 MG tablet Take 4 mg by mouth 2 (two) times daily.     Vitamin D, Ergocalciferol, (DRISDOL) 1.25 MG (50000 UNIT) CAPS capsule Take 50,000 Units by mouth every 7 (seven) days.     No current facility-administered medications for this visit.     Musculoskeletal: Strength & Muscle Tone: na Gait & Station: na Patient leans: N/A  Psychiatric Specialty Exam: Review of Systems  Cardiovascular:  Positive for chest pain.  All other systems reviewed and are negative.   There were no vitals taken for this visit.There is no height or weight on file to calculate BMI.  General Appearance: NA  Eye Contact:  NA  Speech:  Clear and Coherent  Volume:  Normal  Mood:  Euthymic  Affect:  NA  Thought Process:  Goal Directed  Orientation:  Full (Time, Place, and Person)  Thought Content: WDL   Suicidal Thoughts:  No  Homicidal Thoughts:  No  Memory:  Immediate;   Good Recent;   Good Remote;   Fair  Judgement:  Good  Insight:  Fair  Psychomotor  Activity:  Normal  Concentration:  Concentration: Good and Attention Span: Good  Recall:  Good  Fund of Knowledge: Good  Language: Good  Akathisia:  No  Handed:  Right  AIMS (if indicated): not done  Assets:  Communication Skills Desire for Improvement Resilience Social Support Talents/Skills  ADL's:  Intact  Cognition: WNL  Sleep:  Good   Screenings: PHQ2-9    Flowsheet Row [facility] Video Visit from 08/29/2022 in [facility] Video Visit from 07[facility]04/2022 in [facility] Video Visit from 06[facility]05/2022 in [facility] Video Visit from 04/22/2022 in [facility]  PHQ-2 Total Score 0 1 1 1 1       Flowsheet Row Video Visit from 09/26/2022 in [facility] Video Visit from 08/29/2022 in [facility] Video Visit from 07/04/2022 in [facility]  C-SSRS RISK CATEGORY No Risk No Risk No Risk        Assessment and Plan: This patient is a 46 year old female with a history of bipolar disorder anxiety and ADD HD.  She is sleeping better with the Dayvigo 10 mg at bedtime so this will be continued.  She will also continue Xanax 1 mg 3 times daily for anxiety, Concerta 54 mg every morning for ADD, Cymbalta twice daily for depression and Vraylar 3 mg daily for mood stabilization.  She will return to see me in 3 months  Collaboration of Care: Collaboration of Care: Primary Care Provider AEB notes will be shared with PCP at patient's request  Patient/Guardian was advised Release of Information must be obtained prior to any record release in order to collaborate their care with an outside provider. Patient/Guardian was advised if they have not already  done so to contact the  registration department to sign all necessary forms in order for [place] to release information regarding their care.   Consent: Patient/Guardian gives verbal consent for treatment and assignment of benefits for services provided during this visit. Patient/Guardian expressed understanding and agreed to proceed.    [name], MD 09/26/2022, 10:23 AM

## 2022-10-10 ENCOUNTER — Ambulatory Visit: Payer: Medicaid Other | Admitting: Neurology

## 2022-10-22 ENCOUNTER — Ambulatory Visit: Payer: Medicaid Other | Admitting: Psychiatry

## 2022-10-28 ENCOUNTER — Other Ambulatory Visit: Payer: Self-pay | Admitting: Nurse Practitioner

## 2022-11-07 NOTE — Patient Instructions (Incomplete)

## 2022-11-08 ENCOUNTER — Ambulatory Visit: Payer: Medicaid Other | Admitting: Nurse Practitioner

## 2022-11-08 DIAGNOSIS — E039 Hypothyroidism, unspecified: Secondary | ICD-10-CM

## 2022-11-08 DIAGNOSIS — E119 Type 2 diabetes mellitus without complications: Secondary | ICD-10-CM

## 2022-11-08 DIAGNOSIS — E782 Mixed hyperlipidemia: Secondary | ICD-10-CM

## 2022-11-08 DIAGNOSIS — F17200 Nicotine dependence, unspecified, uncomplicated: Secondary | ICD-10-CM

## 2022-11-08 DIAGNOSIS — E559 Vitamin D deficiency, unspecified: Secondary | ICD-10-CM

## 2022-11-18 ENCOUNTER — Ambulatory Visit: Payer: Medicaid Other | Admitting: Psychiatry

## 2022-11-27 ENCOUNTER — Telehealth: Payer: Self-pay | Admitting: *Deleted

## 2022-11-27 NOTE — Telephone Encounter (Signed)
It appears that the patient has an appointment on 12/19/2022 with Ronny Bacon, NP.

## 2022-11-27 NOTE — Telephone Encounter (Signed)
Catherine Howard left a message stating that she cannot keep her appointment on 11/28/2022 @ 1 pm . The reason is that she and her family all have had the flu.  She is asking if she can be rescheduled, or call back when she is feeing better. Please call the patient to make another appointment.  Thank you

## 2022-11-28 ENCOUNTER — Ambulatory Visit: Payer: Medicaid Other | Admitting: Nurse Practitioner

## 2022-12-09 ENCOUNTER — Ambulatory Visit (INDEPENDENT_AMBULATORY_CARE_PROVIDER_SITE_OTHER): Payer: Self-pay | Admitting: Gastroenterology

## 2022-12-18 ENCOUNTER — Other Ambulatory Visit (HOSPITAL_COMMUNITY): Payer: Self-pay | Admitting: Psychiatry

## 2022-12-18 MED ORDER — DAYVIGO 10 MG PO TABS: 10.0000 mg | ORAL_TABLET | Freq: Every day | ORAL | 2 refills | Status: DC

## 2022-12-18 NOTE — Patient Instructions (Incomplete)

## 2022-12-19 ENCOUNTER — Ambulatory Visit: Payer: Medicaid Other | Admitting: Nurse Practitioner

## 2022-12-19 DIAGNOSIS — E119 Type 2 diabetes mellitus without complications: Secondary | ICD-10-CM

## 2022-12-19 DIAGNOSIS — E039 Hypothyroidism, unspecified: Secondary | ICD-10-CM

## 2022-12-19 DIAGNOSIS — E782 Mixed hyperlipidemia: Secondary | ICD-10-CM

## 2022-12-19 DIAGNOSIS — E559 Vitamin D deficiency, unspecified: Secondary | ICD-10-CM

## 2022-12-19 DIAGNOSIS — F17200 Nicotine dependence, unspecified, uncomplicated: Secondary | ICD-10-CM

## 2022-12-27 ENCOUNTER — Telehealth (HOSPITAL_COMMUNITY): Payer: No Typology Code available for payment source | Admitting: Psychiatry

## 2022-12-31 ENCOUNTER — Ambulatory Visit: Payer: Medicaid Other | Admitting: Psychiatry

## 2022-12-31 NOTE — Progress Notes (Deleted)
Referring:  Smith Robert, MD [address] [place],  [place] [address]  PCP: Smith Robert, MD  Neurology was asked to evaluate Catherine Howard, a 47 year old female for a chief complaint of headaches.  Our recommendations of care will be communicated by shared medical record.    CC:  headaches  History provided from ***  HPI:  Medical co-morbidities: IBS, GERD, DM2, bipolar disorder, HLD, hypothyroidism, smoking, cervical and lumbar radiculopathy  The patient presents for evaluation of headaches which began***  She has a history of chronic neck pain from cervical radiculopathy and gets injections for this***  Headache History: Onset: Triggers: Aura: Location: Quality/Description: Associated Symptoms:  Photophobia:  Phonophobia:  Nausea: Vomiting: Allodynia: Other symptoms: Worse with activity?: Duration of headaches:  Headache days per month: *** Migraine days per month: *** Headache free days per month: ***  Current Treatment: Abortive ***  Preventative ***  Prior Therapies                                 Rescue: Fioricet Promethazine Tizanidine 4 mg BID Flexeril Baclofen Maxalt 10 mg PRN  Prevention: Cymbalta 60 mg daily Gabapentin 600 mg BID  LABS: 2023: A1c 6.5, TSH 1.67  IMAGING:  MRI brain 2021: Normal examination. No change since 2013. No abnormality seen to explain the presenting symptoms.  MRI C-spine 2019: 1. At C4-5 there is a right foraminal disc osteophyte complex severely narrowing the right neural foramen. 2. Cervical spine spondylosis otherwise as described above. 3.  No acute osseous injury of the cervical spine.  ***Imaging independently reviewed on December 31, 2022   Current Outpatient Medications on File Prior to Visit  Medication Sig Dispense Refill   ACCU-CHEK AVIVA PLUS test strip SMARTSIG:Via Meter     Accu-Chek Softclix Lancets lancets SMARTSIG:Topical     ALPRAZolam (XANAX) 1 MG tablet Take 1 tablet (1 mg  total) by mouth 3 (three) times daily. 90 tablet 2   Azelastine-Fluticasone 137-50 MCG/ACT SUSP Place 1 spray into the nose in the morning and at bedtime. One spray each nostril two times daily     butalbital-acetaminophen-caffeine (FIORICET) 50-325-40 MG tablet Take by mouth. (Patient not taking: Reported on 07/08/2022)     cariprazine (VRAYLAR) 3 MG capsule TAKE (1) CAPSULE BY MOUTH ONCE DAILY. 30 capsule 3   DULoxetine (CYMBALTA) 60 MG capsule Take 1 capsule (60 mg total) by mouth 2 (two) times daily. 180 capsule 2   esomeprazole (NEXIUM) 40 MG capsule Take 30- 60 min before your first and last meals of the day 60 capsule 2   fenofibrate (TRICOR) 145 MG tablet Take 1 tablet (145 mg total) by mouth daily. 90 tablet 3   ipratropium (ATROVENT HFA) 17 MCG/ACT inhaler Inhale 2 puffs into the lungs every 6 (six) hours as needed for wheezing. 1 each 11   Lemborexant (DAYVIGO) 10 MG TABS Take 10 mg by mouth at bedtime. 30 tablet 2   levocetirizine (XYZAL) 5 MG tablet 1 tablet in the evening     levothyroxine (SYNTHROID) 75 MCG tablet Take 1 tablet (75 mcg total) by mouth daily. 90 tablet 3   linaclotide (LINZESS) 72 MCG capsule Take 1 capsule (72 mcg total) by mouth daily before breakfast. 90 capsule 1   methylphenidate (CONCERTA) 54 MG PO CR tablet Take 1 tablet (54 mg total) by mouth every morning. 30 tablet 0   methylphenidate (CONCERTA) 54 MG PO CR tablet  Take 1 tablet (54 mg total) by mouth every morning. 30 tablet 0   methylphenidate (CONCERTA) 54 MG PO CR tablet Take 1 tablet (54 mg total) by mouth every morning. 30 tablet 0   NOVOFINE PEN NEEDLE 32G X 6 MM MISC USE AS DIRECTED 100 each 3   NYSTATIN powder Apply topically.     pioglitazone (ACTOS) 30 MG tablet Take 1 tablet (30 mg total) by mouth daily. 90 tablet 3   Suvorexant (BELSOMRA) 15 MG TABS Take 15 mg by mouth Nightly.     tirzepatide (MOUNJARO) 7.5 MG/0.5ML Pen Inject 7.5 mg into the skin once a week. 6 mL 1   tiZANidine (ZANAFLEX) 4  MG tablet Take 4 mg by mouth 2 (two) times daily.     Vitamin D, Ergocalciferol, (DRISDOL) 1.25 MG (50000 UNIT) CAPS capsule Take 50,000 Units by mouth every 7 (seven) days.     No current facility-administered medications on file prior to visit.     Allergies: No Known Allergies  Family History: Migraine or other headaches in the family:  *** Aneurysms in a first degree relative:  *** Brain tumors in the family:  *** Other neurological illness in the family:   ***  Past Medical History: Past Medical History:  Diagnosis Date   ADHD    Bipolar I disorder ([place])    Elevated cholesterol    GERD (gastroesophageal reflux disease)    Thyroid disease    Type 2 diabetes mellitus ([place])    Vitamin D deficiency     Past Surgical History Past Surgical History:  Procedure Laterality Date   CESAREAN SECTION     DILATION AND CURETTAGE OF UTERUS      Social History: Social History   Tobacco Use   Smoking status: Every Day    Packs/day: 2.00    Years: 32.00    Total pack years: 64.00    Types: Cigarettes   Smokeless tobacco: Never   Tobacco comments:    smokes 3/4 of a pack a day--01/22/2021  Vaping Use   Vaping Use: Former  Substance Use Topics   Alcohol use: No    Alcohol/week: 0.0 standard drinks of alcohol   Drug use: No   ***  ROS: Negative for fevers, chills. Positive for***. All other systems reviewed and negative unless stated otherwise in HPI.   Physical Exam:   Vital Signs: There were no vitals taken for this visit. GENERAL: well appearing,in no acute distress,alert SKIN:  Color, texture, turgor normal. No rashes or lesions HEAD:  Normocephalic/atraumatic. CV:  RRR RESP: Normal respiratory effort MSK: no tenderness to palpation over occiput, neck, or shoulders  NEUROLOGICAL: Mental Status: Alert, oriented to person, place and time,Follows commands Cranial Nerves: PERRL, visual fields intact to confrontation, extraocular movements intact, facial  sensation intact, no facial droop or ptosis, hearing grossly intact, no dysarthria, palate elevate symmetrically, tongue protrudes midline, shoulder shrug intact and symmetric Motor: muscle strength 5/5 both upper and lower extremities,no drift, normal tone Reflexes: 2+ throughout Sensation: intact to light touch all 4 extremities Coordination: Finger-to- nose-finger intact bilaterally Gait: normal-based   IMPRESSION: ***  PLAN: ***   I spent a total of *** minutes chart reviewing and counseling the patient. Headache education was done. Discussed treatment options including preventive and acute medications, natural supplements, and physical therapy. Discussed medication overuse headache and to limit use of acute treatments to no more than 2 days/week or 10 days/month. Discussed medication side effects, adverse reactions and drug interactions. Written educational  materials and patient instructions outlining all of the above were given.  Follow-up: ***   Ocie Doyne, MD 12/31/2022   8:47 AM

## 2023-01-14 ENCOUNTER — Encounter (HOSPITAL_COMMUNITY): Payer: Self-pay | Admitting: Psychiatry

## 2023-01-14 ENCOUNTER — Telehealth (HOSPITAL_COMMUNITY): Payer: No Typology Code available for payment source | Admitting: Psychiatry

## 2023-01-14 DIAGNOSIS — F3162 Bipolar disorder, current episode mixed, moderate: Secondary | ICD-10-CM | POA: Diagnosis not present

## 2023-01-14 DIAGNOSIS — F902 Attention-deficit hyperactivity disorder, combined type: Secondary | ICD-10-CM | POA: Diagnosis not present

## 2023-01-14 MED ORDER — METHYLPHENIDATE HCL ER (OSM) 54 MG PO TBCR: 54.0000 mg | EXTENDED_RELEASE_TABLET | ORAL | 0 refills | Status: DC

## 2023-01-14 MED ORDER — DAYVIGO 10 MG PO TABS: 10.0000 mg | ORAL_TABLET | Freq: Every day | ORAL | 2 refills | Status: DC

## 2023-01-14 MED ORDER — DULOXETINE HCL 60 MG PO CPEP: 60.0000 mg | ORAL_CAPSULE | Freq: Two times a day (BID) | ORAL | 2 refills | Status: DC

## 2023-01-14 MED ORDER — ALPRAZOLAM 1 MG PO TABS: 1.0000 mg | ORAL_TABLET | Freq: Three times a day (TID) | ORAL | 2 refills | Status: DC

## 2023-01-14 MED ORDER — VRAYLAR 4.5 MG PO CAPS: 4.5000 mg | ORAL_CAPSULE | Freq: Every day | ORAL | 2 refills | Status: DC

## 2023-01-14 NOTE — Progress Notes (Signed)
Virtual Visit via Video Note  I connected with Catherine Howard on 01/14/23 at  9:20 AM EST by a video enabled telemedicine application and verified that I am speaking with the correct person using two identifiers.  Location: Patient: home Provider: office   I discussed the limitations of evaluation and management by telemedicine and the availability of in person appointments. The patient expressed understanding and agreed to proceed.     I discussed the assessment and treatment plan with the patient. The patient was provided an opportunity to ask questions and all were answered. The patient agreed with the plan and demonstrated an understanding of the instructions.   The patient was advised to call back or seek an in-person evaluation if the symptoms worsen or if the condition fails to improve as anticipated.  I provided 15 minutes of non-face-to-face time during this encounter.   Catherine Spiller, MD  [facility] MD/PA/NP OP Progress Note  01/14/2023 9:36 AM Catherine Howard  MRN:  [id number]  Chief Complaint:  Chief Complaint  Patient presents with   Anxiety   Depression   Manic Behavior   HPI:  This patient is a 47 year old divorced white female who lives with her 76, 67 year old daughter and 35 year old son in [place]. Her 41 year old son died of a drug overdose in April 08, 2013. The patient does not work and is on disability   The patient returns for follow-up after 3 months regarding her bipolar disorder anxiety and insomnia.  She states that she is more irritable and moody lately.  She has quit smoking as of January 1 and this might explain some of this.  However she does not feel like the Arman Filter is working quite as well for her mood swings.  She has not had any twitching or jerking or other side effects so I think we can go up safely a little bit more.  She is sleeping well with the debate goal.  She is focusing well with the Concerta.  She denies severe depression thoughts of self-harm or  suicide. Visit Diagnosis:    ICD-10-CM   1. Bipolar 1 disorder, mixed, moderate (HCC)  F31.62     2. Attention deficit hyperactivity disorder (ADHD), combined type  F90.2       Past Psychiatric History: Outpatient treatment for the last several years  Past Medical History:  Past Medical History:  Diagnosis Date   ADHD    Bipolar I disorder ([place])    Elevated cholesterol    GERD (gastroesophageal reflux disease)    Thyroid disease    Type 2 diabetes mellitus ([place])    Vitamin D deficiency     Past Surgical History:  Procedure Laterality Date   CESAREAN SECTION     DILATION AND CURETTAGE OF UTERUS      Family Psychiatric History: See below  Family History:  Family History  Problem Relation Age of Onset   Schizophrenia Father    Alcohol abuse Father    Diabetes Father    Hyperlipidemia Father    Alcohol abuse Paternal Uncle     Social History:  Social History   Socioeconomic History   Marital status: Single    Spouse name: Not on file   Number of children: Not on file   Years of education: Not on file   Highest education level: Not on file  Occupational History   Not on file  Tobacco Use   Smoking status: Every Day    Packs/day: 2.00    Years: 32.00    Total  pack years: 64.00    Types: Cigarettes   Smokeless tobacco: Never   Tobacco comments:    smokes 3/4 of a pack a day--01/22/2021  Vaping Use   Vaping Use: Former  Substance and Sexual Activity   Alcohol use: No    Alcohol/week: 0.0 standard drinks of alcohol   Drug use: No   Sexual activity: Yes    Partners: Male    Birth control/protection: None  Other Topics Concern   Not on file  Social History Narrative   Not on file   Social Determinants of Health   Financial Resource Strain: Not on file  Food Insecurity: Not on file  Transportation Needs: Not on file  Physical Activity: Not on file  Stress: Not on file  Social Connections: Not on file    Allergies: No Known Allergies  Metabolic  Disorder Labs: Lab Results  Component Value Date   HGBA1C 5.9 07/08/2022   No results found for: "PROLACTIN" Lab Results  Component Value Date   CHOL 221 (H) 10/17/2021   TRIG 329 (H) 10/17/2021   HDL 39 (L) 10/17/2021   CHOLHDL 5.7 (H) 10/17/2021   LDLCALC 124 (H) 10/17/2021   [place] 124 10/17/2021   Lab Results  Component Value Date   TSH 1.670 02/27/2022   TSH 0.575 10/17/2021    Therapeutic Level Labs: Lab Results  Component Value Date   LITHIUM 0.40 (L) 07/14/2014   No results found for: "VALPROATE" No results found for: "CBMZ"  Current Medications: Current Outpatient Medications  Medication Sig Dispense Refill   Cariprazine HCl (VRAYLAR) 4.5 MG CAPS Take 1 capsule (4.5 mg total) by mouth daily. 30 capsule 2   ACCU-CHEK AVIVA PLUS test strip SMARTSIG:Via Meter     Accu-Chek Softclix Lancets lancets SMARTSIG:Topical     ALPRAZolam (XANAX) 1 MG tablet Take 1 tablet (1 mg total) by mouth 3 (three) times daily. 90 tablet 2   Azelastine-Fluticasone 137-50 MCG/ACT SUSP Place 1 spray into the nose in the morning and at bedtime. One spray each nostril two times daily     butalbital-acetaminophen-caffeine (FIORICET) 50-325-40 MG tablet Take by mouth. (Patient not taking: Reported on 07/08/2022)     DULoxetine (CYMBALTA) 60 MG capsule Take 1 capsule (60 mg total) by mouth 2 (two) times daily. 180 capsule 2   esomeprazole (NEXIUM) 40 MG capsule Take 30- 60 min before your first and last meals of the day 60 capsule 2   fenofibrate (TRICOR) 145 MG tablet Take 1 tablet (145 mg total) by mouth daily. 90 tablet 3   ipratropium (ATROVENT HFA) 17 MCG/ACT inhaler Inhale 2 puffs into the lungs every 6 (six) hours as needed for wheezing. 1 each 11   Lemborexant (DAYVIGO) 10 MG TABS Take 1 tablet (10 mg total) by mouth at bedtime. 30 tablet 2   levocetirizine (XYZAL) 5 MG tablet 1 tablet in the evening     levothyroxine (SYNTHROID) 75 MCG tablet Take 1 tablet (75 mcg total) by mouth daily.  90 tablet 3   linaclotide (LINZESS) 72 MCG capsule Take 1 capsule (72 mcg total) by mouth daily before breakfast. 90 capsule 1   methylphenidate (CONCERTA) 54 MG PO CR tablet Take 1 tablet (54 mg total) by mouth every morning. 30 tablet 0   methylphenidate (CONCERTA) 54 MG PO CR tablet Take 1 tablet (54 mg total) by mouth every morning. 30 tablet 0   methylphenidate (CONCERTA) 54 MG PO CR tablet Take 1 tablet (54 mg total) by mouth every morning.  30 tablet 0   NOVOFINE PEN NEEDLE 32G X 6 MM MISC USE AS DIRECTED 100 each 3   NYSTATIN powder Apply topically.     pioglitazone (ACTOS) 30 MG tablet Take 1 tablet (30 mg total) by mouth daily. 90 tablet 3   tirzepatide (MOUNJARO) 7.5 MG/0.5ML Pen Inject 7.5 mg into the skin once a week. 6 mL 1   tiZANidine (ZANAFLEX) 4 MG tablet Take 4 mg by mouth 2 (two) times daily.     Vitamin D, Ergocalciferol, (DRISDOL) 1.25 MG (50000 UNIT) CAPS capsule Take 50,000 Units by mouth every 7 (seven) days.     No current facility-administered medications for this visit.     Musculoskeletal: Strength & Muscle Tone: within normal limits Gait & Station: normal Patient leans: N/A  Psychiatric Specialty Exam: Review of Systems  Psychiatric/Behavioral:  Positive for dysphoric mood.   All other systems reviewed and are negative.   There were no vitals taken for this visit.There is no height or weight on file to calculate BMI.  General Appearance: Casual and Fairly Groomed  Eye Contact:  Good  Speech:  Clear and Coherent  Volume:  Normal  Mood:  Dysphoric  Affect:  Flat  Thought Process:  Goal Directed  Orientation:  Full (Time, Place, and Person)  Thought Content: Rumination   Suicidal Thoughts:  No  Homicidal Thoughts:  No  Memory:  Immediate;   Good Recent;   Good Remote;   Fair  Judgement:  Good  Insight:  Fair  Psychomotor Activity:  Normal  Concentration:  Concentration: Good and Attention Span: Good  Recall:  Good  Fund of Knowledge: Good   Language: Good  Akathisia:  No  Handed:  Right  AIMS (if indicated): not done  Assets:  Communication Skills Desire for Improvement Resilience Social Support Talents/Skills  ADL's:  Intact  Cognition: WNL  Sleep:  Good   Screenings: PHQ2-9    Flowsheet Row Video Visit from 09/26/2022 in [place] at [place] Video Visit from 08/29/2022 in [place] at [place] Video Visit from 07/04/2022 in [place] at [place] Video Visit from 06/05/2022 in [place] at [place] Video Visit from 04/22/2022 in [place] at [facility] Total Score 0 1 1 1 1       Flowsheet Row Video Visit from 09/26/2022 in [place] at [place] Video Visit from 08/29/2022 in [place] at [place] Video Visit from 07/04/2022 in [place] at [place] No Risk No Risk No Risk        Assessment and Plan: This patient is a 47 year old female with a history of bipolar disorder anxiety and ADHD.  She has been a little bit more irritable with more pronounced mood swings by her report.  We will increase Vraylar to 4.5 mg daily for mood stabilization, continue Dayvigo 10 mg at bedtime for sleep, Xanax 1 mg 3 times daily for anxiety Concerta 54 mg every morning for ADD and Cymbalta 60 mg twice daily for depression.  She will return to see me in 3 months  Collaboration of Care: Collaboration of Care: Primary Care Provider AEB notes will be shared with PCP at patient's request  Patient/Guardian was advised Release of Information must be obtained prior to any record release in order to collaborate their care with an outside provider. Patient/Guardian was advised if they have  not already done so to contact the registration department to sign all necessary  forms in order for [place] to release information regarding their care.   Consent: Patient/Guardian gives verbal consent for treatment and assignment of benefits for services provided during this visit. Patient/Guardian expressed understanding and agreed to proceed.    [name], MD 01/14/2023, 9:36 AM

## 2023-01-15 ENCOUNTER — Ambulatory Visit: Payer: Medicaid Other | Admitting: Nurse Practitioner

## 2023-01-15 DIAGNOSIS — E119 Type 2 diabetes mellitus without complications: Secondary | ICD-10-CM

## 2023-01-15 DIAGNOSIS — F17200 Nicotine dependence, unspecified, uncomplicated: Secondary | ICD-10-CM

## 2023-01-15 DIAGNOSIS — E559 Vitamin D deficiency, unspecified: Secondary | ICD-10-CM

## 2023-01-15 DIAGNOSIS — E782 Mixed hyperlipidemia: Secondary | ICD-10-CM

## 2023-01-15 DIAGNOSIS — E039 Hypothyroidism, unspecified: Secondary | ICD-10-CM

## 2023-02-06 LAB — BASIC METABOLIC PANEL
BUN: 9 (ref 4–21)
CO2: 27 — AB (ref 13–22)
Chloride: 105 (ref 99–108)
Creatinine: 1 (ref 0.5–1.1)
Glucose: 90
Potassium: 4.9 mEq/L (ref 3.5–5.1)
Sodium: 141 (ref 137–147)

## 2023-02-06 LAB — COMPREHENSIVE METABOLIC PANEL
Albumin: 4.3 (ref 3.5–5.0)
Calcium: 9.8 (ref 8.7–10.7)

## 2023-02-06 LAB — PROTEIN / CREATININE RATIO, URINE: Creatinine, Urine: 43

## 2023-02-06 LAB — MICROALBUMIN, URINE: Microalb, Ur: <0.2

## 2023-02-06 LAB — HEMOGLOBIN A1C: Hemoglobin A1C: 6

## 2023-02-06 LAB — TSH: TSH: 1.82 (ref 0.41–5.90)

## 2023-02-06 LAB — HEPATIC FUNCTION PANEL
ALT: 18 U/L (ref 7–35)
AST: 17 (ref 13–35)
Alkaline Phosphatase: 89 (ref 25–125)
Bilirubin, Total: 0.4

## 2023-02-06 LAB — LIPID PANEL
Cholesterol: 137 (ref 0–200)
HDL: 55 (ref 35–70)
LDL Cholesterol: 64
Triglycerides: 95 (ref 40–160)

## 2023-02-10 ENCOUNTER — Telehealth: Payer: Self-pay

## 2023-02-10 NOTE — Telephone Encounter (Signed)
Patient states she needs a new order for a replacement CPAP sent to [facility].  LOV with Dr. Halford Chessman was on 01/22/2021.  Advised patient she may need an appt with Dr. Halford Chessman first before we can send order in. Scheduled her for appt in [facility] this week on 02/12/2023 at 9:45. Advised that I would verify with Dr. Halford Chessman that patient will need to come in before order can be sent and will call her back if appt can be cancelled.   Please advise, thank you!

## 2023-02-11 NOTE — Telephone Encounter (Signed)
ROV needed first before ordering new CPAP.

## 2023-02-11 NOTE — Telephone Encounter (Signed)
Called and notified patient and she voiced understanding.  She states she has not used CPAP in a while.  She is coming in tomorrow at 9:45.  Nothing further needed at this time.

## 2023-02-12 ENCOUNTER — Encounter: Payer: Self-pay | Admitting: Nurse Practitioner

## 2023-02-12 ENCOUNTER — Ambulatory Visit (INDEPENDENT_AMBULATORY_CARE_PROVIDER_SITE_OTHER): Payer: Medicaid Other | Admitting: Nurse Practitioner

## 2023-02-12 ENCOUNTER — Ambulatory Visit: Payer: Medicaid Other | Admitting: Pulmonary Disease

## 2023-02-12 VITALS — BP 101/70 | HR 98 | Ht 63.5 in | Wt 205.0 lb

## 2023-02-12 DIAGNOSIS — M47816 Spondylosis without myelopathy or radiculopathy, lumbar region: Secondary | ICD-10-CM | POA: Insufficient documentation

## 2023-02-12 DIAGNOSIS — F17200 Nicotine dependence, unspecified, uncomplicated: Secondary | ICD-10-CM

## 2023-02-12 DIAGNOSIS — E782 Mixed hyperlipidemia: Secondary | ICD-10-CM | POA: Diagnosis not present

## 2023-02-12 DIAGNOSIS — E119 Type 2 diabetes mellitus without complications: Secondary | ICD-10-CM

## 2023-02-12 DIAGNOSIS — E039 Hypothyroidism, unspecified: Secondary | ICD-10-CM | POA: Diagnosis not present

## 2023-02-12 DIAGNOSIS — E559 Vitamin D deficiency, unspecified: Secondary | ICD-10-CM | POA: Diagnosis not present

## 2023-02-12 DIAGNOSIS — M5136 Other intervertebral disc degeneration, lumbar region: Secondary | ICD-10-CM | POA: Insufficient documentation

## 2023-02-12 MED ORDER — TIRZEPATIDE 10 MG/0.5ML ~~LOC~~ SOAJ: 10.0000 mg | SUBCUTANEOUS | 1 refills | Status: DC

## 2023-02-12 NOTE — Progress Notes (Signed)
02/12/2023, 3:25 PM Endocrinology Follow Up Visit   Subjective:    Patient ID: Catherine Howard, female    DOB: [birth year].  Catherine Howard is being seen in follow up for management of currently uncontrolled symptomatic diabetes requested by  Vidal Schwalbe, MD.   Past Medical History:  Diagnosis Date   ADHD    Bipolar I disorder ([place])    Elevated cholesterol    GERD (gastroesophageal reflux disease)    Thyroid disease    Type 2 diabetes mellitus ([place])    Vitamin D deficiency     Past Surgical History:  Procedure Laterality Date   CESAREAN SECTION     DILATION AND CURETTAGE OF UTERUS      Social History   Socioeconomic History   Marital status: Single    Spouse name: Not on file   Number of children: Not on file   Years of education: Not on file   Highest education level: Not on file  Occupational History   Not on file  Tobacco Use   Smoking status: Every Day    Packs/day: 2.00    Years: 32.00    Total pack years: 64.00    Types: Cigarettes   Smokeless tobacco: Never   Tobacco comments:    smokes 3/4 of a pack a day--01/22/2021  Vaping Use   Vaping Use: Former  Substance and Sexual Activity   Alcohol use: No    Alcohol/week: 0.0 standard drinks of alcohol   Drug use: No   Sexual activity: Yes    Partners: Male    Birth control/protection: None  Other Topics Concern   Not on file  Social History Narrative   Not on file   Social Determinants of Health   Financial Resource Strain: Not on file  Food Insecurity: Not on file  Transportation Needs: Not on file  Physical Activity: Not on file  Stress: Not on file  Social Connections: Not on file    Family History  Problem Relation Age of Onset   Schizophrenia Father    Alcohol abuse Father    Diabetes Father    Hyperlipidemia Father    Alcohol abuse Paternal Uncle     Outpatient Encounter Medications as of  02/12/2023  Medication Sig   tirzepatide (MOUNJARO) 10 MG/0.5ML Pen Inject 10 mg into the skin once a week.   ACCU-CHEK AVIVA PLUS test strip SMARTSIG:Via Meter   Accu-Chek Softclix Lancets lancets SMARTSIG:Topical   ALPRAZolam (XANAX) 1 MG tablet Take 1 tablet (1 mg total) by mouth 3 (three) times daily.   Azelastine-Fluticasone 137-50 MCG/ACT SUSP Place 1 spray into the nose in the morning and at bedtime. One spray each nostril two times daily   butalbital-acetaminophen-caffeine (FIORICET) 50-325-40 MG tablet Take by mouth. (Patient not taking: Reported on 07/08/2022)   Cariprazine HCl (VRAYLAR) 4.5 MG CAPS Take 1 capsule (4.5 mg total) by mouth daily.   DULoxetine (CYMBALTA) 60 MG capsule Take 1 capsule (60 mg total) by mouth 2 (two) times daily.   esomeprazole (NEXIUM) 40 MG capsule Take 30- 60 min before your first and last meals of the  day   fenofibrate (TRICOR) 145 MG tablet Take 1 tablet (145 mg total) by mouth daily.   ipratropium (ATROVENT HFA) 17 MCG/ACT inhaler Inhale 2 puffs into the lungs every 6 (six) hours as needed for wheezing.   Lemborexant (DAYVIGO) 10 MG TABS Take 1 tablet (10 mg total) by mouth at bedtime.   levocetirizine (XYZAL) 5 MG tablet 1 tablet in the evening   levothyroxine (SYNTHROID) 75 MCG tablet Take 1 tablet (75 mcg total) by mouth daily.   linaclotide (LINZESS) 72 MCG capsule Take 1 capsule (72 mcg total) by mouth daily before breakfast.   methylphenidate (CONCERTA) 54 MG PO CR tablet Take 1 tablet (54 mg total) by mouth every morning.   NOVOFINE PEN NEEDLE 32G X 6 MM MISC USE AS DIRECTED   NYSTATIN powder Apply topically.   tiZANidine (ZANAFLEX) 4 MG tablet Take 4 mg by mouth 2 (two) times daily.   Vitamin D, Ergocalciferol, (DRISDOL) 1.25 MG (50000 UNIT) CAPS capsule Take 50,000 Units by mouth every 7 (seven) days.   [DISCONTINUED] methylphenidate (CONCERTA) 54 MG PO CR tablet Take 1 tablet (54 mg total) by mouth every morning.   [DISCONTINUED]  methylphenidate (CONCERTA) 54 MG PO CR tablet Take 1 tablet (54 mg total) by mouth every morning.   [DISCONTINUED] pioglitazone (ACTOS) 30 MG tablet Take 1 tablet (30 mg total) by mouth daily.   [DISCONTINUED] tirzepatide (MOUNJARO) 7.5 MG/0.5ML Pen Inject 7.5 mg into the skin once a week.   No facility-administered encounter medications on file as of 02/12/2023.    ALLERGIES: No Known Allergies  VACCINATION STATUS: Immunization History  Administered Date(s) Administered   Tdap 01/28/2017    Diabetes She presents for her follow-up diabetic visit. She has type 2 diabetes mellitus. Onset time: She was diagnosed at approximate age of 47 years. Her disease course has been stable. There are no hypoglycemic associated symptoms. Pertinent negatives for hypoglycemia include no confusion, headaches, nervousness/anxiousness, pallor or seizures. Associated symptoms include fatigue. Pertinent negatives for diabetes include no chest pain, no foot ulcerations, no polydipsia, no polyphagia, no polyuria, no visual change and no weight loss. There are no hypoglycemic complications. Symptoms are stable. There are no diabetic complications. Risk factors for coronary artery disease include diabetes mellitus, dyslipidemia, obesity, sedentary lifestyle, tobacco exposure and hypertension. Current diabetic treatment includes oral agent (monotherapy) (and Mounjaro). She is compliant with treatment most of the time. Her weight is decreasing steadily. She is following a generally healthy diet. When asked about meal planning, she reported none. She has not had a previous visit with a dietitian. She rarely participates in exercise. (She presents today with her logs showing inconsistent glucose monitoring, did not have to routinely monitor due to safe medication regimen.  Her most recent A1c on 02/06/23 was 6%, essentially unchanged from previous visit.  She notes she has finally quit smoking!  She continues to lose weight and  generally feels much better.  She does note some recent hot flashes lasting all day.) An ACE inhibitor/angiotensin II receptor blocker is not being taken. She does not see a podiatrist.Eye exam is current.  Hyperlipidemia This is a chronic problem. The current episode started more than 1 year ago. The problem is uncontrolled (yet improving). Recent lipid tests were reviewed and are high. Exacerbating diseases include diabetes, hypothyroidism and obesity. Factors aggravating her hyperlipidemia include fatty foods and smoking. Pertinent negatives include no chest pain, myalgias or shortness of breath. Current antihyperlipidemic treatment includes statins and fibric acid derivatives. The current treatment  provides moderate improvement of lipids. Compliance problems include adherence to diet and adherence to exercise.  Risk factors for coronary artery disease include diabetes mellitus, dyslipidemia, a sedentary lifestyle, obesity and hypertension.    Review of systems  Constitutional: + steadily decreasing body weight,  current Body mass index is 35.74 kg/m. , + fatigue, + subjective hyperthermia, no subjective hypothermia Eyes: no blurry vision, no xerophthalmia ENT: no sore throat, no nodules palpated in throat, no dysphagia/odynophagia, no hoarseness Cardiovascular: no chest pain, no shortness of breath, no palpitations, no leg swelling Respiratory: no cough, no shortness of breath Gastrointestinal: no nausea/vomiting/diarrhea Musculoskeletal: no muscle/joint aches Skin: no rashes, no hyperemia Neurological: no tremors, no numbness, no tingling, no dizziness Psychiatric: no depression, no anxiety, hx bipolar- currently controlled on meds  Objective:     BP 101/70   Pulse 98   Ht 5' 3.5" (1.613 m)   Wt 205 lb (93 kg)   BMI 35.74 kg/m   Wt Readings from Last 3 Encounters:  02/12/23 205 lb (93 kg)  07/08/22 217 lb (98.4 kg)  05/03/22 219 lb 9.6 oz (99.6 kg)    BP Readings from Last 3  Encounters:  02/12/23 101/70  07/08/22 114/78  05/03/22 (!) 144/88      Physical Exam- Limited  Constitutional:  Body mass index is 35.74 kg/m. , not in acute distress, normal state of mind Eyes:  EOMI, no exophthalmos Musculoskeletal: no gross deformities, strength intact in all four extremities, no gross restriction of joint movements Skin:  no rashes, no hyperemia Neurological: no tremor with outstretched hands   Diabetic Foot Exam - Simple   No data filed    CMP ( most recent) CMP     Component Value Date/Time   NA 141 02/06/2023 0000   K 4.9 02/06/2023 0000   CL 105 02/06/2023 0000   CO2 27 (A) 02/06/2023 0000   GLUCOSE 85 02/27/2022 0839   GLUCOSE 89 07/14/2014 0958   BUN 9 02/06/2023 0000   CREATININE 1.0 02/06/2023 0000   CREATININE 0.74 02/27/2022 0839   CREATININE 0.75 07/14/2014 0958   CALCIUM 9.8 02/06/2023 0000   PROT 6.7 02/27/2022 0839   ALBUMIN 4.3 02/06/2023 0000   ALBUMIN 4.3 02/27/2022 0839   AST 17 02/06/2023 0000   ALT 18 02/06/2023 0000   ALKPHOS 89 02/06/2023 0000   BILITOT <0.2 02/27/2022 0839   GFRNONAA 80 10/10/2020 0000     Diabetic Labs (most recent): Lab Results  Component Value Date   HGBA1C 6.0 02/06/2023   HGBA1C 5.9 07/08/2022   HGBA1C 6.1 02/28/2022   MICROALBUR <0.2 02/06/2023   MICROALBUR 10 07/08/2022   MICROALBUR 10 01/24/2021     Lipid Panel     Component Value Date/Time   CHOL 137 02/06/2023 0000   CHOL 221 (H) 10/17/2021 1008   TRIG 95 02/06/2023 0000   HDL 55 02/06/2023 0000   HDL 39 (L) 10/17/2021 1008   CHOLHDL 5.7 (H) 10/17/2021 1008   LDLCALC 64 02/06/2023 0000   LDLCALC 124 (H) 10/17/2021 1008   LABVLDL 58 (H) 10/17/2021 1008      Assessment & Plan:   1) Type 2 diabetes mellitus without complication, without long-term current use of insulin (HCC)  - Catherine Howard has currently uncontrolled symptomatic type 2 DM since  47 years of age.  She presents today with her logs showing inconsistent  glucose monitoring, did not have to routinely monitor due to safe medication regimen.  Her most recent A1c on 02/06/23  was 6%, essentially unchanged from previous visit.  She notes she has finally quit smoking!  She continues to lose weight and generally feels much better.  She does note some recent hot flashes lasting all day.  - I had a long discussion with her about the progressive nature of diabetes and the pathology behind its complications. -her diabetes is complicated by obesity/sedentary life, chronic  Smoking, sleep apnea and she remains at a high risk for more acute and chronic complications which include CAD, CVA, CKD, retinopathy, and neuropathy. These are all discussed in detail with her.  - Nutritional counseling repeated at each appointment due to patients tendency to fall back in to old habits.  - The patient admits there is a room for improvement in their diet and drink choices. -  Suggestion is made for the patient to avoid simple carbohydrates from their diet including Cakes, Sweet Desserts / Pastries, Ice Cream, Soda (diet and regular), Sweet Tea, Candies, Chips, Cookies, Sweet Pastries, Store Bought Juices, Alcohol in Excess of 1-2 drinks a day, Artificial Sweeteners, Coffee Creamer, and "Sugar-free" Products. This will help patient to have stable blood glucose profile and potentially avoid unintended weight gain.   - I encouraged the patient to switch to unprocessed or minimally processed complex starch and increased protein intake (animal or plant source), fruits, and vegetables.   - Patient is advised to stick to a routine mealtimes to eat 3 meals a day and avoid unnecessary snacks (to snack only to correct hypoglycemia).  - I have approached her with the following individualized plan to manage  her diabetes and patient agrees:   -She is advised to increase her Mounjaro to 10 mg SQ weekly (but use up her remaining 7.5 mg supply first) and stop her Actos altogether at this time.    -She can take a break from routine glucose monitoring due to monotherapy with Mounjaro only.  - Specific targets for  A1c;  LDL, HDL,  and Triglycerides were discussed with the patient.  2) Blood Pressure /Hypertension: Her blood pressure is controlled to target.  She is not currently on any antihypertensive medications at this time.  If BP is elevated on 3 separate occasions, she will be considered for low dose ACE/ARB for renal protection from DM.  3) Lipids/Hyperlipidemia:  Her most recent lipid panel from 02/06/23 shows uncontrolled LDL of 64 (greatly improved).  She is advised to continue Crestor 20 mg po daily at bedtime and Fenofibrate 145 mg po daily.  Side effects and precautions discussed with her.  She is also advised to avoid fried foods and butter.    4)  Weight/Diet:  Her Body mass index is 35.74 kg/m.-   clearly complicating her diabetes care.   she is  a candidate for weight loss. I discussed with her the fact that loss of 5 - 10% of her  current body weight will have the most impact on her diabetes management.  Exercise, and detailed carbohydrates information provided  -  detailed on discharge instructions.    5) Hypothyroidism- unspecified Her previsit TFTs show normal TSH- indicating proper hormone replacement.  She is advised to continue her Levothyroxine 75 mcg po daily before breakfast.  Will recheck TFTs prior to next visit.  - The correct intake of thyroid hormone (Levothyroxine, Synthroid), is on empty stomach first thing in the morning, with water, separated by at least 30 minutes from breakfast and other medications,  and separated by more than 4 hours from calcium, iron, multivitamins,  acid reflux medications (PPIs).  - This medication is a life-long medication and will be needed to correct thyroid hormone imbalances for the rest of your life.  The dose may change from time to time, based on thyroid blood work.  - It is extremely important to be consistent taking  this medication, near the same time each morning.  -AVOID TAKING PRODUCTS CONTAINING BIOTIN (commonly found in Hair, Skin, Nails vitamins) AS IT INTERFERES WITH THE VALIDITY OF THYROID FUNCTION BLOOD TESTS.  6) Vitamin D Deficiency Her most recent vitamin D level was 30 on 13/8/23.  She is currently taking Ergocalciferol 50000 units weekly, she is advised to continue.  Will recheck vitamin D prior to next visit.  7) Chronic Care/Health Maintenance: -she is on Statin medications and  is encouraged to initiate and continue to follow up with Ophthalmology, Dentist, Podiatrist at least yearly or according to recommendations, and advised to  Quit smoking. I have recommended yearly flu vaccine and pneumonia vaccine at least every 5 years; moderate intensity exercise for up to 150 minutes weekly; and  sleep for at least 7 hours a day.  Smoking cessation instruction/counseling given:  counseled patient on the dangers of tobacco use, advised patient to stop smoking, and reviewed strategies to maximize success  - she is advised to maintain close follow up with Vidal Schwalbe, MD for primary care needs, as well as her other providers for optimal and coordinated care.      I spent  32  minutes in the care of the patient today including review of labs from [place], Lipids, Thyroid Function, Hematology (current and previous including abstractions from other facilities); face-to-face time discussing  her blood glucose readings/logs, discussing hypoglycemia and hyperglycemia episodes and symptoms, medications doses, her options of short and long term treatment based on the latest standards of care / guidelines;  discussion about incorporating lifestyle medicine;  and documenting the encounter. Risk reduction counseling performed per USPSTF guidelines to reduce obesity and cardiovascular risk factors.     Please refer to Patient Instructions for Blood Glucose Monitoring and Insulin/Medications Dosing Guide"  in media  tab for additional information. Please  also refer to " Patient Self Inventory" in the Media  tab for reviewed elements of pertinent patient history.  [name] participated in the discussions, expressed understanding, and voiced agreement with the above plans.  All questions were answered to her satisfaction. she is encouraged to contact clinic should she have any questions or concerns prior to her return visit.    Follow up plan: - Return in about 4 months (around 06/13/2023) for Diabetes F/U with A1c in office, Previsit labs, Thyroid follow up.  Rayetta Pigg, [facility] [address] [place], [place] [address] Phone: [phone] Fax: [phone]  02/12/2023, 3:25 PM

## 2023-02-12 NOTE — Patient Instructions (Signed)

## 2023-02-13 ENCOUNTER — Ambulatory Visit: Payer: Medicaid Other | Admitting: Psychiatry

## 2023-02-13 ENCOUNTER — Encounter: Payer: Self-pay | Admitting: Psychiatry

## 2023-02-13 ENCOUNTER — Ambulatory Visit: Payer: Medicaid Other | Admitting: Pulmonary Disease

## 2023-02-13 NOTE — Progress Notes (Deleted)
Referring:  Vidal Schwalbe, MD [address] [place],  [place] [address]  PCP: Vidal Schwalbe, MD  Neurology was asked to evaluate Catherine Howard, a 47 year old female for a chief complaint of headaches.  Our recommendations of care will be communicated by shared medical record.    CC:  headaches  History provided from ***  HPI:  Medical co-morbidities: DM2, cervical and lumbar stenosis, bipolar disorder, HLD  The patient presents for evaluation of headaches which began***  Headache History: Onset: Triggers: Aura: Location: Quality/Description: Associated Symptoms:  Photophobia:  Phonophobia:  Nausea: Vomiting: Allodynia: Other symptoms: Worse with activity?: Duration of headaches:  Headache days per month: *** Migraine days per month: *** Headache free days per month: ***  Current Treatment: Abortive ***  Preventative ***  Prior Therapies                                 Fioricet Cymbalta 60 mg daily Propranolol 10 mg BID   LABS:    Latest Ref Rng & Units 02/06/2023   12:00 AM 02/27/2022    8:39 AM 10/17/2021   10:08 AM  CMP  Glucose 70 - 99 mg/dL  85  89   BUN 4 - 21 9  10  11   $ Creatinine 0.5 - 1.1 1.0  0.74  0.79   Sodium 137 - 147 141  142  139   Potassium 3.5 - 5.1 mEq/L 4.9  4.4  5.0   Chloride 99 - 108 105  104  100   CO2 13 - 22 27  23  24   $ Calcium 8.7 - 10.7 9.8  9.7  10.2   Total Protein 6.0 - 8.5 g/dL  6.7  6.9   Total Bilirubin 0.0 - 1.2 mg/dL  <0.2  <0.2   Alkaline Phos 25 - 125 89  112  114   AST 13 - 35 17  19  24   $ ALT 7 - 35 U/L 18  21  26    $ 02/06/23 TSH wnl  IMAGING:  MRI brain 10/08/20: no acute process, 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 90 tablet 2   Azelastine-Fluticasone 137-50 MCG/ACT SUSP Place 1 spray into the nose in the morning and at bedtime. One spray each nostril two times daily     butalbital-acetaminophen-caffeine (FIORICET) 50-325-40 MG tablet Take by mouth. (Patient not taking: Reported on 07/08/2022)     Cariprazine HCl (VRAYLAR) 4.5 MG CAPS Take 1 capsule (4.5 mg total) by mouth daily. 30 capsule 2   DULoxetine (CYMBALTA) 60 MG capsule Take 1 capsule (60 mg total) by mouth 2 (two) times daily. 180 capsule 2   esomeprazole (NEXIUM) 40 MG capsule Take 30- 60 min before your first and last meals of the day 60 capsule 2   fenofibrate (TRICOR) 145 MG tablet Take 1 tablet (145 mg total) by mouth daily. 90 tablet 3   ipratropium (ATROVENT HFA) 17 MCG/ACT inhaler Inhale 2 puffs into the lungs every 6 (six) hours as needed  for wheezing. 1 each 11   Lemborexant (DAYVIGO) 10 MG TABS Take 1 tablet (10 mg total) by mouth at bedtime. 30 tablet 2   levocetirizine (XYZAL) 5 MG tablet 1 tablet in the evening     levothyroxine (SYNTHROID) 75 MCG tablet Take 1 tablet (75 mcg total) by mouth daily. 90 tablet 3   linaclotide (LINZESS) 72 MCG capsule Take 1 capsule (72 mcg total) by mouth daily before breakfast. 90 capsule 1   methylphenidate (CONCERTA) 54 MG PO CR tablet Take 1 tablet (54 mg total) by mouth every morning. 30 tablet 0   NOVOFINE PEN NEEDLE 32G X 6 MM MISC USE AS DIRECTED 100 each 3   NYSTATIN powder Apply topically.     tirzepatide (MOUNJARO) 10 MG/0.5ML Pen Inject 10 mg into the skin once a week. 6 mL 1   tiZANidine (ZANAFLEX) 4 MG tablet Take 4 mg by mouth 2 (two) times daily.     Vitamin D, Ergocalciferol, (DRISDOL) 1.25 MG (50000 UNIT) CAPS capsule Take 50,000 Units by mouth every 7 (seven) days.     No current facility-administered medications on file prior to visit.     Allergies: No  Known Allergies  Family History: Migraine or other headaches in the family:  *** Aneurysms in a first degree relative:  *** Brain tumors in the family:  *** Other neurological illness in the family:   ***  Past Medical History: Past Medical History:  Diagnosis Date   ADHD    Bipolar I disorder ([place])    Elevated cholesterol    GERD (gastroesophageal reflux disease)    Thyroid disease    Type 2 diabetes mellitus ([place])    Vitamin D deficiency     Past Surgical History Past Surgical History:  Procedure Laterality Date   CESAREAN SECTION     DILATION AND CURETTAGE OF UTERUS      Social History: Social History   Tobacco Use   Smoking status: Every Day    Packs/day: 2.00    Years: 32.00    Total pack years: 64.00    Types: Cigarettes   Smokeless tobacco: Never   Tobacco comments:    smokes 3/4 of a pack a day--01/22/2021  Vaping Use   Vaping Use: Former  Substance Use Topics   Alcohol use: No    Alcohol/week: 0.0 standard drinks of alcohol   Drug use: No   ***  ROS: Negative for fevers, chills. Positive for***. All other systems reviewed and negative unless stated otherwise in HPI.   Physical Exam:   Vital Signs: There were no vitals taken for this visit. GENERAL: well appearing,in no acute distress,alert SKIN:  Color, texture, turgor normal. No rashes or lesions HEAD:  Normocephalic/atraumatic. CV:  RRR RESP: Normal respiratory effort MSK: no tenderness to palpation over occiput, neck, or shoulders  NEUROLOGICAL: Mental Status: Alert, oriented to person, place and time,Follows commands Cranial Nerves: PERRL, visual fields intact to confrontation, extraocular movements intact, facial sensation intact, no facial droop or ptosis, hearing grossly intact, no dysarthria, palate elevate symmetrically, tongue protrudes midline, shoulder shrug intact and symmetric Motor: muscle strength 5/5 both upper and lower extremities,no drift, normal tone Reflexes: 2+  throughout Sensation: intact to light touch all 4 extremities Coordination: Finger-to- nose-finger intact bilaterally Gait: normal-based   IMPRESSION: ***  PLAN: ***   I spent a total of *** minutes chart reviewing and counseling the patient. Headache education was done. Discussed treatment options including preventive and acute medications, natural supplements, and physical  therapy. Discussed medication overuse headache and to limit use of acute treatments to no more than 2 days/week or 10 days/month. Discussed medication side effects, adverse reactions and drug interactions. Written educational materials and patient instructions outlining all of the above were given.  Follow-up: ***   Genia Harold, MD 02/13/2023   8:14 AM

## 2023-02-20 ENCOUNTER — Encounter: Payer: Self-pay | Admitting: Radiology

## 2023-02-26 ENCOUNTER — Telehealth: Payer: Self-pay | Admitting: Nurse Practitioner

## 2023-02-26 NOTE — Telephone Encounter (Signed)
Pt called stating she was waiting on a PA for her Mounjaro.  Can you please check on this?

## 2023-02-27 ENCOUNTER — Other Ambulatory Visit (HOSPITAL_COMMUNITY): Payer: Self-pay

## 2023-02-27 NOTE — Telephone Encounter (Signed)
Goodness!  She has been on just about every medication under the sun (Jardiance, Victoza, Actos, Glipizide, Januvia, Tradjenta, Onglyza, Metformin).  This is a continuation of Mounjaro which has worked well for her just a higher dose.  All the others were just simply ineffective at controlling her glucose and caused more adverse effects (GI side effects, UTIs, hypoglycemia, etc)

## 2023-02-27 NOTE — Telephone Encounter (Signed)
I think the only one from that list that she has tried was the Victoza.  She was put on this medication over a year ago with approval from her insurance, so I guess I am just confused as to why they dont want to cover it as a continuation of therapy.

## 2023-02-27 NOTE — Telephone Encounter (Signed)
Pharmacy Patient Advocate Encounter   Received notification from Pt calls msgs/provider that prior authorization for [facility] '10mg'$  is required/requested.  Working on [place] through [employer].  I'm not sure it will be approved. Can you help me answer any of these questions? I'm only seeing that she took Victoza, but don't see any information about why she stopped it or a second med I can list as failure/contraindication.

## 2023-03-04 NOTE — Telephone Encounter (Signed)
Pt said that she is waiting on a PA for the Mounjaro 10 mg. Please Advise. Thanks!

## 2023-03-04 NOTE — Telephone Encounter (Signed)
Pharmacy Patient Advocate Encounter   PA submitted on 03/04/23 to (ins) [place] Medicaid via [employer] or [facility]) confirmation # [id number] W Status is pending

## 2023-03-10 ENCOUNTER — Ambulatory Visit: Payer: Medicaid Other | Admitting: Pulmonary Disease

## 2023-03-12 ENCOUNTER — Ambulatory Visit (HOSPITAL_COMMUNITY): Payer: No Typology Code available for payment source | Admitting: Psychiatry

## 2023-03-13 ENCOUNTER — Other Ambulatory Visit (HOSPITAL_COMMUNITY): Payer: Self-pay | Admitting: Psychiatry

## 2023-03-13 ENCOUNTER — Ambulatory Visit (INDEPENDENT_AMBULATORY_CARE_PROVIDER_SITE_OTHER): Payer: No Typology Code available for payment source | Admitting: Psychiatry

## 2023-03-13 ENCOUNTER — Encounter (HOSPITAL_COMMUNITY): Payer: Self-pay | Admitting: Psychiatry

## 2023-03-13 VITALS — BP 122/80 | HR 90 | Ht 63.5 in | Wt 199.6 lb

## 2023-03-13 DIAGNOSIS — F3162 Bipolar disorder, current episode mixed, moderate: Secondary | ICD-10-CM | POA: Diagnosis not present

## 2023-03-13 DIAGNOSIS — F902 Attention-deficit hyperactivity disorder, combined type: Secondary | ICD-10-CM | POA: Diagnosis not present

## 2023-03-13 MED ORDER — ALPRAZOLAM 1 MG PO TABS: 1.0000 mg | ORAL_TABLET | Freq: Three times a day (TID) | ORAL | 2 refills | Status: DC

## 2023-03-13 MED ORDER — METHYLPHENIDATE HCL ER (OSM) 54 MG PO TBCR: 54.0000 mg | EXTENDED_RELEASE_TABLET | ORAL | 0 refills | Status: DC

## 2023-03-13 MED ORDER — VRAYLAR 4.5 MG PO CAPS: 4.5000 mg | ORAL_CAPSULE | Freq: Every day | ORAL | 2 refills | Status: DC

## 2023-03-13 MED ORDER — DAYVIGO 10 MG PO TABS: 10.0000 mg | ORAL_TABLET | Freq: Every day | ORAL | 2 refills | Status: DC

## 2023-03-13 MED ORDER — DULOXETINE HCL 60 MG PO CPEP: 60.0000 mg | ORAL_CAPSULE | Freq: Two times a day (BID) | ORAL | 2 refills | Status: DC

## 2023-03-13 NOTE — Progress Notes (Signed)
BH MD/PA/NP OP Progress Note  03/13/2023 11:27 AM Abril Needler  MRN:  [id number]  Chief Complaint:  Chief Complaint  Patient presents with   Depression   Anxiety   Manic Behavior   ADHD   Follow-up   HPI: This patient is a 47 year old divorced white female who lives with her 38, 52 year old daughter and 51 year old son in [place]. Her 33 year old son died of a drug overdose in 03-26-2013. The patient does not work and is on disability   The patient returns in person after 2 months regarding her bipolar disorder anxiety insomnia and ADD.  Last time we increased her Vraylar and it has helped somewhat with the irritability.  However right now she is very upset.  Apparently her landlord is increasing the rent.  They are looking to move out and have not had much luck.  She states the landlord does not fix many things that are wrong in the trailer and the other expects him to pay more money.  This is making her angry and upset and it is affecting her sleep.  I urged her to write down an action plan with her fianc to figure out what they are going to do next rather than worry about it in the middle of the night.  She denies significant depression anxiety thoughts of self-harm or suicide.  She denies auditory or visual destinations.  She is focusing fairly well with the Concerta.  The Xanax continues to help her anxiety. Visit Diagnosis:    ICD-10-CM   1. Bipolar 1 disorder, mixed, moderate (HCC)  F31.62     2. Attention deficit hyperactivity disorder (ADHD), combined type  F90.2       Past Psychiatric History: Patient treatment for the last several years  Past Medical History:  Past Medical History:  Diagnosis Date   ADHD    Bipolar I disorder ([place])    Elevated cholesterol    GERD (gastroesophageal reflux disease)    Thyroid disease    Type 2 diabetes mellitus ([employer])    Vitamin D deficiency     Past Surgical History:  Procedure Laterality Date   CESAREAN SECTION     DILATION AND  CURETTAGE OF UTERUS      Family Psychiatric History: See below  Family History:  Family History  Problem Relation Age of Onset   Schizophrenia Father    Alcohol abuse Father    Diabetes Father    Hyperlipidemia Father    Alcohol abuse Paternal Uncle     Social History:  Social History   Socioeconomic History   Marital status: Single    Spouse name: Not on file   Number of children: Not on file   Years of education: Not on file   Highest education level: Not on file  Occupational History   Not on file  Tobacco Use   Smoking status: Every Day    Packs/day: 2.00    Years: 32.00    Additional pack years: 0.00    Total pack years: 64.00    Types: Cigarettes   Smokeless tobacco: Never   Tobacco comments:    smokes 3/4 of a pack a day--01/22/2021  Vaping Use   Vaping Use: Former  Substance and Sexual Activity   Alcohol use: No    Alcohol/week: 0.0 standard drinks of alcohol   Drug use: No   Sexual activity: Yes    Partners: Male    Birth control/protection: None  Other Topics Concern   Not on file  Social  History Narrative   Not on file   Social Determinants of Health   Financial Resource Strain: Not on file  Food Insecurity: Not on file  Transportation Needs: Not on file  Physical Activity: Not on file  Stress: Not on file  Social Connections: Not on file    Allergies: No Known Allergies  Metabolic Disorder Labs: Lab Results  Component Value Date   HGBA1C 6.0 02/06/2023   No results found for: "PROLACTIN" Lab Results  Component Value Date   CHOL 137 02/06/2023   TRIG 95 02/06/2023   HDL 55 02/06/2023   CHOLHDL 5.7 (H) 10/17/2021   LDLCALC 64 02/06/2023   LDLCALC 124 (H) 10/17/2021   Lab Results  Component Value Date   TSH 1.82 02/06/2023   TSH 1.670 02/27/2022    Therapeutic Level Labs: Lab Results  Component Value Date   LITHIUM 0.40 (L) 07/14/2014   No results found for: "VALPROATE" No results found for: "CBMZ"  Current  Medications: Current Outpatient Medications  Medication Sig Dispense Refill   methylphenidate (CONCERTA) 54 MG PO CR tablet Take 1 tablet (54 mg total) by mouth every morning. 30 tablet 0   methylphenidate (CONCERTA) 54 MG PO CR tablet Take 1 tablet (54 mg total) by mouth every morning. 30 tablet 0   ACCU-CHEK AVIVA PLUS test strip SMARTSIG:Via Meter     Accu-Chek Softclix Lancets lancets SMARTSIG:Topical     ALPRAZolam (XANAX) 1 MG tablet Take 1 tablet (1 mg total) by mouth 3 (three) times daily. 90 tablet 2   Azelastine-Fluticasone 137-50 MCG/ACT SUSP Place 1 spray into the nose in the morning and at bedtime. One spray each nostril two times daily     butalbital-acetaminophen-caffeine (FIORICET) 50-325-40 MG tablet Take by mouth. (Patient not taking: Reported on 07/08/2022)     Cariprazine HCl (VRAYLAR) 4.5 MG CAPS Take 1 capsule (4.5 mg total) by mouth daily. 30 capsule 2   DULoxetine (CYMBALTA) 60 MG capsule Take 1 capsule (60 mg total) by mouth 2 (two) times daily. 180 capsule 2   esomeprazole (NEXIUM) 40 MG capsule Take 30- 60 min before your first and last meals of the day 60 capsule 2   fenofibrate (TRICOR) 145 MG tablet Take 1 tablet (145 mg total) by mouth daily. 90 tablet 3   ipratropium (ATROVENT HFA) 17 MCG/ACT inhaler Inhale 2 puffs into the lungs every 6 (six) hours as needed for wheezing. 1 each 11   Lemborexant (DAYVIGO) 10 MG TABS Take 1 tablet (10 mg total) by mouth at bedtime. 30 tablet 2   levocetirizine (XYZAL) 5 MG tablet 1 tablet in the evening     levothyroxine (SYNTHROID) 75 MCG tablet Take 1 tablet (75 mcg total) by mouth daily. 90 tablet 3   linaclotide (LINZESS) 72 MCG capsule Take 1 capsule (72 mcg total) by mouth daily before breakfast. 90 capsule 1   methylphenidate (CONCERTA) 54 MG PO CR tablet Take 1 tablet (54 mg total) by mouth every morning. 30 tablet 0   NOVOFINE PEN NEEDLE 32G X 6 MM MISC USE AS DIRECTED 100 each 3   NYSTATIN powder Apply topically.      tirzepatide (MOUNJARO) 10 MG/0.5ML Pen Inject 10 mg into the skin once a week. 6 mL 1   tiZANidine (ZANAFLEX) 4 MG tablet Take 4 mg by mouth 2 (two) times daily.     Vitamin D, Ergocalciferol, (DRISDOL) 1.25 MG (50000 UNIT) CAPS capsule Take 50,000 Units by mouth every 7 (seven) days.  No current facility-administered medications for this visit.     Musculoskeletal: Strength & Muscle Tone: within normal limits Gait & Station: normal Patient leans: N/A  Psychiatric Specialty Exam: Review of Systems  Constitutional:  Positive for fatigue.  All other systems reviewed and are negative.   Blood pressure 122/80, pulse 90, height 5' 3.5" (1.613 m), weight 199 lb 9.6 oz (90.5 kg), SpO2 96 %.Body mass index is 34.8 kg/m.  General Appearance: Casual, Neat, and Well Groomed  Eye Contact:  Good  Speech:  Clear and Coherent  Volume:  Normal  Mood:  Irritable  Affect:  Flat  Thought Process:  Goal Directed  Orientation:  Full (Time, Place, and Person)  Thought Content: Rumination   Suicidal Thoughts:  No  Homicidal Thoughts:  No  Memory:  Immediate;   Good Recent;   Good Remote;   NA  Judgement:  Fair  Insight:  Fair  Psychomotor Activity:  Normal  Concentration:  Concentration: Good and Attention Span: Good  Recall:  Good  Fund of Knowledge: Good  Language: Good  Akathisia:  No  Handed:  Right  AIMS (if indicated): not done  Assets:  Communication Skills Desire for Improvement Resilience Social Support Talents/Skills  ADL's:  Intact  Cognition: WNL  Sleep:  Fair   Screenings: GAD-7    [place] Office Visit from 03/13/2023 in [place] at [place]  Total GAD-7 Score 14      PHQ2-9    [place] Office Visit from 03/13/2023 in [place] at [facility] Video Visit from 09/26/2022 in [place] at [place] Video Visit from 08/29/2022 in [place] at [place] Video Visit from 07/04/2022 in [place] at [place] Video Visit from 06/05/2022 in [place] at [facility] Total Score 4 0 1 1 1   PHQ-9 Total Score 12 -- -- -- --      [place] Office Visit from 03/13/2023 in [place] at [facility] Video Visit from 09/26/2022 in [place] at [place] Video Visit from 08/29/2022 in [place] at [place] No Risk No Risk No Risk        Assessment and Plan: This patient is a 47 year old female with a history of bipolar disorder anxiety and ADD.  As usual she gets very upset about situational problems.  I do not think there is much more we can do with this to her medication.  I urged her to make a plan for how she and her fianc are going to change her housing situation.  For now she will continue Vraylar 4.5 mg daily for mood stabilization, Dayvigo 10 mg at bedtime for sleep, Xanax 1 mg 3 times daily for anxiety, Concerta 54 mg every morning for ADD and Cymbalta 60 mg twice daily for depression.  She will return to see me in 3 months  Collaboration of Care: Collaboration of Care: Primary Care Provider AEB notes will be shared with PCP at patient's request  Patient/Guardian was advised Release of Information must be obtained prior to any record release in order to collaborate their care with an outside provider. Patient/Guardian was advised if they have not already done so to contact the registration department to sign all necessary forms in order for [place] to release information regarding their care.   Consent: Patient/Guardian gives verbal consent for treatment and  assignment of benefits for services provided during this visit. Patient/Guardian expressed understanding and agreed to proceed.    [name], MD 03/13/2023, 11:27 AM

## 2023-03-17 ENCOUNTER — Telehealth: Payer: Self-pay | Admitting: *Deleted

## 2023-03-17 DIAGNOSIS — G4733 Obstructive sleep apnea (adult) (pediatric): Secondary | ICD-10-CM

## 2023-03-17 NOTE — Telephone Encounter (Signed)
Patient called and states that she had a procedure and was unable to come to office to see Dr. Halford Chessman but asks if we can put in a new prescription for CPAP machine supplies to [facility] in [place], [place]. Please call and advise patient. [phone]

## 2023-03-17 NOTE — Telephone Encounter (Signed)
Okay to send script for CPAP 11 cm H2O with heated humidity.  She needs ROV in 4 months.

## 2023-03-18 NOTE — Telephone Encounter (Signed)
Informed pt I placed new order for CPAP machine courtesy Dr Halford Chessman. Pt verbalized understanding. Nothing further needed at this time.

## 2023-03-19 ENCOUNTER — Telehealth (HOSPITAL_COMMUNITY): Payer: Self-pay | Admitting: *Deleted

## 2023-03-19 NOTE — Telephone Encounter (Signed)
LMOM

## 2023-03-19 NOTE — Telephone Encounter (Signed)
We can't go any higher than the previous dosage of Adderall 20 mg bid, same goes for regular ritalin and concerta. She already tried vyvanse so there isn't much left to try

## 2023-03-19 NOTE — Telephone Encounter (Signed)
Patient called stating that the Concerta is not working. Per pt she would like to see if provider could put her on a higher dose of her Adderall to see if it would work better or the Ritalin. [phone]

## 2023-03-20 ENCOUNTER — Other Ambulatory Visit (HOSPITAL_COMMUNITY): Payer: Self-pay | Admitting: Psychiatry

## 2023-03-20 MED ORDER — AMPHETAMINE-DEXTROAMPHETAMINE 20 MG PO TABS: 20.0000 mg | ORAL_TABLET | Freq: Two times a day (BID) | ORAL | 0 refills | Status: DC

## 2023-03-20 NOTE — Telephone Encounter (Signed)
Informed patient with what provider stated and she verbalized understanding. Per pt she would like provider to just put her on the Adderall BID due to being off of it for a while.

## 2023-03-20 NOTE — Telephone Encounter (Signed)
sent 

## 2023-03-21 ENCOUNTER — Telehealth: Payer: Self-pay | Admitting: Pulmonary Disease

## 2023-03-21 NOTE — Telephone Encounter (Signed)
Can we call this patient and get her set up for a follow up with Dr Halford Chessman for her cpap   Thank you   She was seen last year

## 2023-03-24 ENCOUNTER — Telehealth: Payer: Medicaid Other | Admitting: Pulmonary Disease

## 2023-03-24 NOTE — Telephone Encounter (Signed)
Patient aware.

## 2023-03-24 NOTE — Telephone Encounter (Signed)
Patient called requesting to speak with Meagan- wants to know if we can send information to Medicaid including office notes, date of overnight sleep study, CPAP information, etc. Patient does not have any contact information for Medicaid.

## 2023-03-24 NOTE — Telephone Encounter (Signed)
ATC patient.  VM was full.    

## 2023-04-02 ENCOUNTER — Telehealth: Payer: Self-pay | Admitting: *Deleted

## 2023-04-02 NOTE — Telephone Encounter (Signed)
Patient called and states that [facility] needs new order/prescription for CPAP machine and last office visit summary. Patient does not have fax number but has office number of [phone] and would like a call back once everything has been sent over. Please call and advise patient [phone]

## 2023-04-03 NOTE — Telephone Encounter (Signed)
Called and spoke w/ Brad @ [facility] - he states that the [facility] team is handling the pts account. Nida Boatman states that as far as he can tell the team has gotten everything they needed.   Nida Boatman states that he will give me a call back with an update once he reaches someone.   Called and informed pt of this update as well.

## 2023-04-07 NOTE — Telephone Encounter (Signed)
04/07/2023 Patient called back and states that she spoke with someone at [facility] and per [facility], they are missing the Rx signature/date and code E0601. Please call and advise patient with any updates.  [phone]

## 2023-04-08 NOTE — Telephone Encounter (Signed)
Called and spoke w/ Catherine Howard again he states that the team handling states that he has a CMN digitally signed w/ a date by Dr.Sood and he is going to reach out to the team.

## 2023-04-08 NOTE — Telephone Encounter (Signed)
ATC pt LVM for her with the information I was provided from [place].

## 2023-04-13 ENCOUNTER — Other Ambulatory Visit (HOSPITAL_COMMUNITY): Payer: Self-pay | Admitting: Psychiatry

## 2023-04-14 ENCOUNTER — Telehealth: Payer: Self-pay | Admitting: *Deleted

## 2023-04-14 NOTE — Telephone Encounter (Signed)
Called and spoke w/ pt she verbalized that [facility] sent email w/ order # and the type of machine she is getting but then told pt her order was voided due to Medicaid.

## 2023-04-14 NOTE — Telephone Encounter (Signed)
Called and spoke w/ Catherine Howard he verbalized that that out source team handling the pt acct. They are requesting a OV w/ VS or a CMN signed. Per [facility] there is a CMN in the pt file already signed.   He is now going to include [facility] into the acct and get it handled. Pt has expressed that if the situation isn't resolved in 2-3 business days she wants to change DME companies.   [facility] is aware of pts frustration and wanting to change companies. He stated that he is sending message to [name] now.     Called and spoke w/ pt again and she verbalized understanding.

## 2023-04-14 NOTE — Telephone Encounter (Signed)
Patient called and states that someone has canceled her prescription for CPAP machine and wants to talk to someone about next steps to take. See previous encounters.  Please call and advise patient [phone]

## 2023-04-14 NOTE — Telephone Encounter (Signed)
Spoke to [place] with [facility] who stated that pt is not eligible for a new cpap until October 24, 2023.

## 2023-04-14 NOTE — Telephone Encounter (Signed)
Called and spoke w/ pt documented in previous encounter, closing this one

## 2023-04-15 ENCOUNTER — Ambulatory Visit (HOSPITAL_COMMUNITY): Payer: No Typology Code available for payment source | Admitting: Psychiatry

## 2023-04-16 NOTE — Telephone Encounter (Signed)
Called and spoke w/ pt she verbalized that she is extremely upset w/ [facility] and their lack of communication. They are offering to lend her a machine but never contacted pt again.   Pt states that her machine is currently broke - the water chamber is messed up and the machine is making noises.   Told pt I was going to call [facility] and see if she brings her machine in will they fix it "free of charge" or how much it would cost.   Called and spoke w/ Brad @ [facility] he verbalized that he is going to request another follow up on the case.

## 2023-04-17 ENCOUNTER — Ambulatory Visit (HOSPITAL_BASED_OUTPATIENT_CLINIC_OR_DEPARTMENT_OTHER): Payer: Medicaid Other | Admitting: Pulmonary Disease

## 2023-04-18 ENCOUNTER — Telehealth (HOSPITAL_COMMUNITY): Payer: Self-pay | Admitting: *Deleted

## 2023-04-18 NOTE — Telephone Encounter (Signed)
Patient called stating if its okay for her to take Allegra with her Adderall. Per pt she don't want to take anything that would interact with that.

## 2023-04-18 NOTE — Telephone Encounter (Signed)
Patient is aware and verbalized understanding.

## 2023-04-24 ENCOUNTER — Other Ambulatory Visit (HOSPITAL_COMMUNITY): Payer: Self-pay | Admitting: Psychiatry

## 2023-04-24 ENCOUNTER — Telehealth (HOSPITAL_COMMUNITY): Payer: Self-pay | Admitting: *Deleted

## 2023-04-24 MED ORDER — AMPHETAMINE-DEXTROAMPHETAMINE 20 MG PO TABS: 20.0000 mg | ORAL_TABLET | Freq: Two times a day (BID) | ORAL | 0 refills | Status: DC

## 2023-04-24 NOTE — Telephone Encounter (Signed)
Patient called stating she is needing refill for her Adderall. She would like it to be sent to [facility]

## 2023-04-24 NOTE — Telephone Encounter (Signed)
Informed pt that medication was sent

## 2023-05-23 ENCOUNTER — Other Ambulatory Visit (HOSPITAL_COMMUNITY): Payer: Self-pay | Admitting: Psychiatry

## 2023-06-03 ENCOUNTER — Telehealth (HOSPITAL_COMMUNITY): Payer: Self-pay

## 2023-06-03 NOTE — Telephone Encounter (Signed)
Catherine Howard with [facility] [phone]  called in stating that they are needing a letter faxed over to them at [phone] stating that it is medically necessary for pt to continue taking benzos while they are prescribing opiates. Pt is scheduled 06/16/24. Please advise.

## 2023-06-04 NOTE — Telephone Encounter (Signed)
First, we need a release signed by pt. Also, I don't see any opiates prescribed on her list. They need to inform me of the meds they are prescribing as well as their last note

## 2023-06-04 NOTE — Telephone Encounter (Signed)
Called office no answer left vm to return my call

## 2023-06-05 NOTE — Telephone Encounter (Signed)
I can write it after she signs a release and we can obtain the other offices last notes

## 2023-06-05 NOTE — Telephone Encounter (Signed)
Called office spoke with Lorene Dy since Maddie is on vacation until 06/11/23. She is saying that they can send [place] a request but is wanting Dr Tenny Craw to fax them over a letter requesting information from previous note. Spoke with pt advised her to come fill out release of information. Pt states that letter is needed due to her taking hydrocodone 5's while being on xanax from Dr Tenny Craw and they are wanting to put her on lortabs. Please advise.

## 2023-06-09 ENCOUNTER — Ambulatory Visit (HOSPITAL_COMMUNITY)
Admission: RE | Admit: 2023-06-09 | Discharge: 2023-06-09 | Disposition: A | Discharge status: Home / Self Care | Payer: Medicaid Other | Source: Ambulatory Visit

## 2023-06-09 ENCOUNTER — Other Ambulatory Visit (HOSPITAL_COMMUNITY): Payer: Self-pay

## 2023-06-09 DIAGNOSIS — M542 Cervicalgia: Secondary | ICD-10-CM | POA: Insufficient documentation

## 2023-06-10 LAB — TSH: TSH: 1.4 u[IU]/mL (ref 0.450–4.500)

## 2023-06-10 LAB — T4, FREE: Free T4: 1.57 ng/dL (ref 0.82–1.77)

## 2023-06-12 ENCOUNTER — Other Ambulatory Visit (HOSPITAL_COMMUNITY): Payer: Self-pay | Admitting: Nurse Practitioner

## 2023-06-12 ENCOUNTER — Other Ambulatory Visit (HOSPITAL_COMMUNITY): Payer: Self-pay | Admitting: Psychiatry

## 2023-06-12 DIAGNOSIS — R928 Other abnormal and inconclusive findings on diagnostic imaging of breast: Secondary | ICD-10-CM

## 2023-06-13 ENCOUNTER — Telehealth (HOSPITAL_COMMUNITY): Payer: No Typology Code available for payment source | Admitting: Psychiatry

## 2023-06-16 ENCOUNTER — Telehealth: Payer: Self-pay | Admitting: *Deleted

## 2023-06-16 ENCOUNTER — Ambulatory Visit: Payer: Medicaid Other | Admitting: Nurse Practitioner

## 2023-06-16 DIAGNOSIS — E782 Mixed hyperlipidemia: Secondary | ICD-10-CM

## 2023-06-16 DIAGNOSIS — E039 Hypothyroidism, unspecified: Secondary | ICD-10-CM

## 2023-06-16 DIAGNOSIS — E119 Type 2 diabetes mellitus without complications: Secondary | ICD-10-CM

## 2023-06-16 DIAGNOSIS — E559 Vitamin D deficiency, unspecified: Secondary | ICD-10-CM

## 2023-06-16 MED ORDER — FENOFIBRATE 145 MG PO TABS: 145.0000 mg | ORAL_TABLET | Freq: Every day | ORAL | 3 refills | Status: DC

## 2023-06-16 MED ORDER — VITAMIN D (ERGOCALCIFEROL) 1.25 MG (50000 UNIT) PO CAPS: 50000.0000 [IU] | ORAL_CAPSULE | ORAL | 0 refills | Status: DC

## 2023-06-16 MED ORDER — TIRZEPATIDE 10 MG/0.5ML ~~LOC~~ SOAJ: 10.0000 mg | SUBCUTANEOUS | 1 refills | Status: DC

## 2023-06-16 MED ORDER — LEVOTHYROXINE SODIUM 75 MCG PO TABS: 75.0000 ug | ORAL_TABLET | Freq: Every day | ORAL | 3 refills | Status: DC

## 2023-06-16 NOTE — Telephone Encounter (Signed)
Patient called to cancel her appointment for today. Her Dad is I the [place] [facility] and not doing well. She also ask about her thyroid results. She would also like to make another appointment.  Patient was given appointment for 07/31/2023. She was also given her lab results per Ronny Bacon, FNP. Patient is asking for refills for the following medications be sent in Levothyroxine, Vitamin D, Fenofibrate,and Mounjaro. She states that she is using the [employer].

## 2023-06-16 NOTE — Telephone Encounter (Signed)
Patient was called and made aware. 

## 2023-06-16 NOTE — Telephone Encounter (Signed)
Done, I have processed those refills for her.

## 2023-06-17 ENCOUNTER — Telehealth (HOSPITAL_COMMUNITY): Payer: No Typology Code available for payment source | Admitting: Psychiatry

## 2023-06-18 ENCOUNTER — Telehealth: Payer: Self-pay | Admitting: Pulmonary Disease

## 2023-06-18 DIAGNOSIS — G4733 Obstructive sleep apnea (adult) (pediatric): Secondary | ICD-10-CM

## 2023-06-18 NOTE — Telephone Encounter (Signed)
Got CPAP machine from [facility] but it is corroded. Dr. Craige Cotta wrote a new RX but they are giving her a hard time.  [facility] needs the RX  along with what had happened with her Cpap (corroded in the process of her using it.). Please resend in accordingly to the [place] office.  Please  call with any questions [phone]

## 2023-06-19 ENCOUNTER — Ambulatory Visit (HOSPITAL_COMMUNITY): Payer: Medicaid Other

## 2023-06-19 ENCOUNTER — Encounter (HOSPITAL_COMMUNITY): Payer: Self-pay

## 2023-06-19 ENCOUNTER — Encounter (HOSPITAL_COMMUNITY): Payer: Medicaid Other

## 2023-06-19 NOTE — Telephone Encounter (Signed)
Pt needs new CPAP.  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  I have put in updated order.

## 2023-06-20 ENCOUNTER — Telehealth (INDEPENDENT_AMBULATORY_CARE_PROVIDER_SITE_OTHER): Payer: No Typology Code available for payment source | Admitting: Psychiatry

## 2023-06-20 ENCOUNTER — Encounter (HOSPITAL_COMMUNITY): Payer: Self-pay | Admitting: Psychiatry

## 2023-06-20 DIAGNOSIS — F902 Attention-deficit hyperactivity disorder, combined type: Secondary | ICD-10-CM

## 2023-06-20 DIAGNOSIS — F3162 Bipolar disorder, current episode mixed, moderate: Secondary | ICD-10-CM

## 2023-06-20 MED ORDER — AMPHETAMINE-DEXTROAMPHETAMINE 20 MG PO TABS: 20.0000 mg | ORAL_TABLET | Freq: Two times a day (BID) | ORAL | 0 refills | Status: DC

## 2023-06-20 MED ORDER — DULOXETINE HCL 60 MG PO CPEP: 60.0000 mg | ORAL_CAPSULE | Freq: Two times a day (BID) | ORAL | 2 refills | Status: DC

## 2023-06-20 MED ORDER — ALPRAZOLAM 1 MG PO TABS: 1.0000 mg | ORAL_TABLET | Freq: Three times a day (TID) | ORAL | 2 refills | Status: DC

## 2023-06-20 MED ORDER — VRAYLAR 4.5 MG PO CAPS: 1.0000 | ORAL_CAPSULE | Freq: Every day | ORAL | 2 refills | Status: DC

## 2023-06-20 NOTE — Progress Notes (Signed)
Virtual Visit via Telephone Note  I connected with Catherine Howard on 06/20/23 at  9:20 AM EDT by telephone and verified that I am speaking with the correct person using two identifiers.  Location: Patient: home Provider: office   I discussed the limitations, risks, security and privacy concerns of performing an evaluation and management service by telephone and the availability of in person appointments. I also discussed with the patient that there may be a patient responsible charge related to this service. The patient expressed understanding and agreed to proceed.    I discussed the assessment and treatment plan with the patient. The patient was provided an opportunity to ask questions and all were answered. The patient agreed with the plan and demonstrated an understanding of the instructions.   The patient was advised to call back or seek an in-person evaluation if the symptoms worsen or if the condition fails to improve as anticipated.  I provided 15 minutes of non-face-to-face time during this encounter.   [name], MD  [facility] MD/PA/NP OP Progress Note  06/20/2023 9:30 AM Catherine Howard  MRN:  [id number]  Chief Complaint:  Chief Complaint  Patient presents with   ADD   Depression   Anxiety   Manic Behavior   Follow-up   HPI: This patient is a 47 year old divorced white female who lives with her fianc, 80 year old daughter and 25 year old son in [place]. Her 12 year old son died of a drug overdose in 03-Jul-2013. The patient does not work and is on disability   The patient returns for follow-up after 3 months regarding her bipolar disorder anxiety insomnia and ADD.  She states that she is doing very well.  Her mood has been stable and she denies significant depression and/or manic symptoms.  She is sleeping well at night.  Her anxiety is under good control.  She has not had any difficulties with focus.  She denies thoughts of suicide or self-harm.  She thinks her current regimen is  working well for her Visit Diagnosis:    ICD-10-CM   1. Bipolar 1 disorder, mixed, moderate (HCC)  F31.62     2. Attention deficit hyperactivity disorder (ADHD), combined type  F90.2       Past Psychiatric History: Outpatient treatment for the last several years  Past Medical History:  Past Medical History:  Diagnosis Date   ADHD    Bipolar I disorder (HCC)    Elevated cholesterol    GERD (gastroesophageal reflux disease)    Thyroid disease    Type 2 diabetes mellitus (HCC)    Vitamin D deficiency     Past Surgical History:  Procedure Laterality Date   CESAREAN SECTION     DILATION AND CURETTAGE OF UTERUS      Family Psychiatric History: See below  Family History:  Family History  Problem Relation Age of Onset   Schizophrenia Father    Alcohol abuse Father    Diabetes Father    Hyperlipidemia Father    Alcohol abuse Paternal Uncle     Social History:  Social History   Socioeconomic History   Marital status: Single    Spouse name: Not on file   Number of children: Not on file   Years of education: Not on file   Highest education level: Not on file  Occupational History   Not on file  Tobacco Use   Smoking status: Every Day    Packs/day: 2.00    Years: 32.00    Additional pack years: 0.00  Total pack years: 64.00    Types: Cigarettes   Smokeless tobacco: Never   Tobacco comments:    smokes 3/4 of a pack a day--01/22/2021  Vaping Use   Vaping Use: Former  Substance and Sexual Activity   Alcohol use: No    Alcohol/week: 0.0 standard drinks of alcohol   Drug use: No   Sexual activity: Yes    Partners: Male    Birth control/protection: None  Other Topics Concern   Not on file  Social History Narrative   Not on file   Social Determinants of Health   Financial Resource Strain: Not on file  Food Insecurity: Not on file  Transportation Needs: Not on file  Physical Activity: Not on file  Stress: Not on file  Social Connections: Not on file     Allergies: No Known Allergies  Metabolic Disorder Labs: Lab Results  Component Value Date   HGBA1C 6.0 02/06/2023   No results found for: "PROLACTIN" Lab Results  Component Value Date   CHOL 137 02/06/2023   TRIG 95 02/06/2023   HDL 55 02/06/2023   CHOLHDL 5.7 (H) 10/17/2021   LDLCALC 64 02/06/2023   LDLCALC 124 (H) 10/17/2021   Lab Results  Component Value Date   TSH 1.400 06/09/2023   TSH 1.82 02/06/2023    Therapeutic Level Labs: Lab Results  Component Value Date   LITHIUM 0.40 (L) 07/14/2014   No results found for: "VALPROATE" No results found for: "CBMZ"  Current Medications: Current Outpatient Medications  Medication Sig Dispense Refill   amphetamine-dextroamphetamine (ADDERALL) 20 MG tablet Take 1 tablet (20 mg total) by mouth 2 (two) times daily. 60 tablet 0   amphetamine-dextroamphetamine (ADDERALL) 20 MG tablet Take 1 tablet (20 mg total) by mouth 2 (two) times daily. 60 tablet 0   ACCU-CHEK AVIVA PLUS test strip SMARTSIG:Via Meter     Accu-Chek Softclix Lancets lancets SMARTSIG:Topical     ALPRAZolam (XANAX) 1 MG tablet Take 1 tablet (1 mg total) by mouth 3 (three) times daily. 90 tablet 2   amphetamine-dextroamphetamine (ADDERALL) 20 MG tablet Take 1 tablet (20 mg total) by mouth 2 (two) times daily. 60 tablet 0   Azelastine-Fluticasone 137-50 MCG/ACT SUSP Place 1 spray into the nose in the morning and at bedtime. One spray each nostril two times daily     butalbital-acetaminophen-caffeine (FIORICET) 50-325-40 MG tablet Take by mouth. (Patient not taking: Reported on 07/08/2022)     Cariprazine HCl (VRAYLAR) 4.5 MG CAPS Take 1 capsule (4.5 mg total) by mouth daily. 30 capsule 2   DULoxetine (CYMBALTA) 60 MG capsule Take 1 capsule (60 mg total) by mouth 2 (two) times daily. 180 capsule 2   esomeprazole (NEXIUM) 40 MG capsule Take 30- 60 min before your first and last meals of the day 60 capsule 2   fenofibrate (TRICOR) 145 MG tablet Take 1 tablet (145 mg  total) by mouth daily. 90 tablet 3   ipratropium (ATROVENT HFA) 17 MCG/ACT inhaler Inhale 2 puffs into the lungs every 6 (six) hours as needed for wheezing. 1 each 11   levocetirizine (XYZAL) 5 MG tablet 1 tablet in the evening     levothyroxine (SYNTHROID) 75 MCG tablet Take 1 tablet (75 mcg total) by mouth daily. 90 tablet 3   linaclotide (LINZESS) 72 MCG capsule Take 1 capsule (72 mcg total) by mouth daily before breakfast. 90 capsule 1   NOVOFINE PEN NEEDLE 32G X 6 MM MISC USE AS DIRECTED 100 each 3   NYSTATIN  powder Apply topically.     tirzepatide (MOUNJARO) 10 MG/0.5ML Pen Inject 10 mg into the skin once a week. 6 mL 1   tiZANidine (ZANAFLEX) 4 MG tablet Take 4 mg by mouth 2 (two) times daily.     Vitamin D, Ergocalciferol, (DRISDOL) 1.25 MG (50000 UNIT) CAPS capsule Take 1 capsule (50,000 Units total) by mouth every 7 (seven) days. 12 capsule 0   No current facility-administered medications for this visit.     Musculoskeletal: Strength & Muscle Tone: na Gait & Station: na Patient leans: N/A  Psychiatric Specialty Exam: Review of Systems  All other systems reviewed and are negative.   There were no vitals taken for this visit.There is no height or weight on file to calculate BMI.  General Appearance: NA  Eye Contact:  NA  Speech:  Clear and Coherent  Volume:  Normal  Mood:  Euthymic  Affect:  NA  Thought Process:  Goal Directed  Orientation:  Full (Time, Place, and Person)  Thought Content: WDL   Suicidal Thoughts:  No  Homicidal Thoughts:  No  Memory:  Immediate;   Good Recent;   Good Remote;   Fair  Judgement:  Good  Insight:  Fair  Psychomotor Activity:  Normal  Concentration:  Concentration: Good and Attention Span: Good  Recall:  Good  Fund of Knowledge: Good  Language: Good  Akathisia:  No  Handed:  Right  AIMS (if indicated): not done  Assets:  Communication Skills Desire for Improvement Physical Health Resilience Social Support Talents/Skills   ADL's:  Intact  Cognition: WNL  Sleep:  Good   Screenings: GAD-7    Flowsheet Row Office Visit from 03/13/2023 in [facility] at [place]  Total GAD-7 Score 14      PHQ2-9    Flowsheet Row Office Visit from 03/13/2023 in [facility] at [place] Video Visit from 09/26/2022 in [facility] at [place] Video Visit from 08/29/2022 in [facility] at [place] Video Visit from 07/04/2022 in [facility] at [place] Video Visit from 06/05/2022 in [facility] at [facility] Total Score 4 0 1 1 1   PHQ-9 Total Score 12 -- -- -- --      Flowsheet Row Office Visit from 03/13/2023 in [facility] at [place] Video Visit from 09/26/2022 in [facility] at [place] Video Visit from 08/29/2022 in [facility] at [place]  C-SSRS RISK CATEGORY No Risk No Risk No Risk        Assessment and Plan: This patient is a 47 year old female with history of bipolar disorder anxiety and ADD.  She is doing well on her current regimen.  She will continue Vraylar 4.5 mg daily for mood stabilization, Xanax 1 mg 3 times daily for anxiety, Adderall 20 mg twice daily for ADD and Cymbalta 60 mg twice daily for depression.  She is no longer needing Dayvigo for sleep.  She will return to see me in 3 months  Collaboration of Care: Collaboration of Care: Primary Care Provider AEB notes will be shared with PCP at patient's request  Patient/Guardian was advised Release of Information must be obtained prior to any record release in order to collaborate their care with an outside provider. Patient/Guardian was advised if they have not already done so to contact the registration department to sign all necessary forms in order for [place] to release  information regarding  their care.   Consent: Patient/Guardian gives verbal consent for treatment and assignment of benefits for services provided during this visit. Patient/Guardian expressed understanding and agreed to proceed.    [name], MD 06/20/2023, 9:30 AM

## 2023-06-23 ENCOUNTER — Ambulatory Visit (INDEPENDENT_AMBULATORY_CARE_PROVIDER_SITE_OTHER): Payer: Medicaid Other | Admitting: Gastroenterology

## 2023-06-25 ENCOUNTER — Telehealth: Payer: Self-pay | Admitting: Pulmonary Disease

## 2023-06-25 NOTE — Telephone Encounter (Signed)
Pt. Calling in about [employer]  giving her trouble to get new cpap advised her that  she needs to take old one back to them but very upset with DME company and needs advise how to proceed she was  hung up on by DME company yesterday

## 2023-06-25 NOTE — Telephone Encounter (Addendum)
Left detailed message on pt's VM to advise I spoke with Brad from [employer] about this.  Per [employer]'s note, the phone call did get dropped & when they tried to reach the patient, they received a busy signal.  I left a detailed message on pt's VM to contact [employer] at [phone] & ask to speak with someone with the [facility] to help.    Pt will be eligible for a new device in about 2 1/2 months, but in the mean time, pt may be able to use a loaner/temporary device.  They would like to perform a system check on the device as well to help determine the issue(s).

## 2023-06-27 NOTE — Telephone Encounter (Signed)
Called pt. Back and gave her updated info closing encounter

## 2023-06-30 ENCOUNTER — Ambulatory Visit: Payer: Medicaid Other | Admitting: Pulmonary Disease

## 2023-06-30 NOTE — Telephone Encounter (Signed)
Please call PT to advise and update them that Dr. Craige Cotta put in a new order.

## 2023-07-03 NOTE — Telephone Encounter (Signed)
[facility] patient. Per DME, left detailed vm letting pt know an updated order has been put in for her cpap machine.   Nothing further needed.

## 2023-07-21 ENCOUNTER — Telehealth (HOSPITAL_COMMUNITY): Payer: Self-pay | Admitting: *Deleted

## 2023-07-21 NOTE — Telephone Encounter (Signed)
Per pt she would like to inform provider of the medications s her Pain Management provider put her on. Per pt she is on:  7.5mg  Norco....headaches  350mg  of Soma 1 tabs BID  Per pt she is Not taking Xanax any more and was stopped approx. last month.  Wondering if there was something else provider can put her on for her Anxiety.

## 2023-07-22 ENCOUNTER — Telehealth (HOSPITAL_COMMUNITY): Payer: MEDICAID | Admitting: Psychiatry

## 2023-07-22 ENCOUNTER — Encounter (HOSPITAL_COMMUNITY): Payer: Self-pay | Admitting: Psychiatry

## 2023-07-22 DIAGNOSIS — F902 Attention-deficit hyperactivity disorder, combined type: Secondary | ICD-10-CM | POA: Diagnosis not present

## 2023-07-22 DIAGNOSIS — F3162 Bipolar disorder, current episode mixed, moderate: Secondary | ICD-10-CM | POA: Diagnosis not present

## 2023-07-22 MED ORDER — HYDROXYZINE HCL 25 MG PO TABS: 25.0000 mg | ORAL_TABLET | Freq: Three times a day (TID) | ORAL | 2 refills | Status: DC | PRN

## 2023-07-22 NOTE — Progress Notes (Signed)
Virtual Visit via Telephone Note  I connected with Catherine Howard on 07/22/23 at  4:20 PM EDT by telephone and verified that I am speaking with the correct person using two identifiers.  Location: Patient: home Provider: office   I discussed the limitations, risks, security and privacy concerns of performing an evaluation and management service by telephone and the availability of in person appointments. I also discussed with the patient that there may be a patient responsible charge related to this service. The patient expressed understanding and agreed to proceed.      I discussed the assessment and treatment plan with the patient. The patient was provided an opportunity to ask questions and all were answered. The patient agreed with the plan and demonstrated an understanding of the instructions.   The patient was advised to call back or seek an in-person evaluation if the symptoms worsen or if the condition fails to improve as anticipated.  I provided 15 minutes of non-face-to-face time during this encounter.   [name], MD  [facility] MD/PA/NP OP Progress Note  07/22/2023 10:43 AM Catherine Howard  MRN:  [id number]  Chief Complaint:  Chief Complaint  Patient presents with   Anxiety   Depression   Manic Behavior   Follow-up   HPI:  This patient is a 47 year old divorced white female who lives with her fianc, 2 year old daughter and 49 year old son in [place]. Her 78 year old son died of a drug overdose in 08-06-13. The patient does not work and is on disability   The patient returns for follow-up after 4 weeks as a work in.  She states she is actually doing pretty well but she was recently put on Soma and hydrocodone for her headaches and neck pain by pain management clinic.  Because of these medicines they have weaned her off Xanax.  She states she is generally doing okay but is having more anxiety although not overt panic attacks.  She would like to try something else.  I explained  that all the benzodiazepines will be contraindicated with the pain medicine but we could try BuSpar or hydroxyzine.  Since she reports BuSpar did not work in the past we will try hydroxyzine.  She states overall her mood is stable and she is generally sleeping okay although she has worries about her husband and her fianc's health. Visit Diagnosis:    ICD-10-CM   1. Bipolar 1 disorder, mixed, moderate (HCC)  F31.62     2. Attention deficit hyperactivity disorder (ADHD), combined type  F90.2       Past Psychiatric History: Outpatient treatment for the last several years  Past Medical History:  Past Medical History:  Diagnosis Date   ADHD    Bipolar I disorder (HCC)    Elevated cholesterol    GERD (gastroesophageal reflux disease)    Thyroid disease    Type 2 diabetes mellitus (HCC)    Vitamin D deficiency     Past Surgical History:  Procedure Laterality Date   CESAREAN SECTION     DILATION AND CURETTAGE OF UTERUS      Family Psychiatric History: See below  Family History:  Family History  Problem Relation Age of Onset   Schizophrenia Father    Alcohol abuse Father    Diabetes Father    Hyperlipidemia Father    Alcohol abuse Paternal Uncle     Social History:  Social History   Socioeconomic History   Marital status: Single    Spouse name: Not on file   Number  of children: Not on file   Years of education: Not on file   Highest education level: Not on file  Occupational History   Not on file  Tobacco Use   Smoking status: Every Day    Current packs/day: 2.00    Average packs/day: 2.0 packs/day for 32.0 years (64.0 ttl pk-yrs)    Types: Cigarettes   Smokeless tobacco: Never   Tobacco comments:    smokes 3/4 of a pack a day--01/22/2021  Vaping Use   Vaping status: Former  Substance and Sexual Activity   Alcohol use: No    Alcohol/week: 0.0 standard drinks of alcohol   Drug use: No   Sexual activity: Yes    Partners: Male    Birth control/protection: None   Other Topics Concern   Not on file  Social History Narrative   Not on file   Social Determinants of Health   Financial Resource Strain: Not on file  Food Insecurity: Not on file  Transportation Needs: Not on file  Physical Activity: Not on file  Stress: Not on file  Social Connections: Not on file    Allergies: No Known Allergies  Metabolic Disorder Labs: Lab Results  Component Value Date   HGBA1C 6.0 02/06/2023   No results found for: "PROLACTIN" Lab Results  Component Value Date   CHOL 137 02/06/2023   TRIG 95 02/06/2023   HDL 55 02/06/2023   CHOLHDL 5.7 (H) 10/17/2021   LDLCALC 64 02/06/2023   LDLCALC 124 (H) 10/17/2021   Lab Results  Component Value Date   TSH 1.400 06/09/2023   TSH 1.82 02/06/2023    Therapeutic Level Labs: Lab Results  Component Value Date   LITHIUM 0.40 (L) 07/14/2014   No results found for: "VALPROATE" No results found for: "CBMZ"  Current Medications: Current Outpatient Medications  Medication Sig Dispense Refill   hydrOXYzine (ATARAX) 25 MG tablet Take 1 tablet (25 mg total) by mouth 3 (three) times daily as needed for anxiety. 90 tablet 2   ACCU-CHEK AVIVA PLUS test strip SMARTSIG:Via Meter     Accu-Chek Softclix Lancets lancets SMARTSIG:Topical     ACETAMINOPHEN-BUTALBITAL 50-325 MG TABS Take 1 tablet every 4 hours by oral route.     amphetamine-dextroamphetamine (ADDERALL) 20 MG tablet Take 1 tablet (20 mg total) by mouth 2 (two) times daily. 60 tablet 0   amphetamine-dextroamphetamine (ADDERALL) 20 MG tablet Take 1 tablet (20 mg total) by mouth 2 (two) times daily. 60 tablet 0   amphetamine-dextroamphetamine (ADDERALL) 20 MG tablet Take 1 tablet (20 mg total) by mouth 2 (two) times daily. 60 tablet 0   Azelastine-Fluticasone 137-50 MCG/ACT SUSP Place 1 spray into the nose in the morning and at bedtime. One spray each nostril two times daily     butalbital-acetaminophen-caffeine (FIORICET) 50-325-40 MG tablet Take by mouth.  (Patient not taking: Reported on 07/08/2022)     Cariprazine HCl (VRAYLAR) 4.5 MG CAPS Take 1 capsule (4.5 mg total) by mouth daily. 30 capsule 2   carisoprodol (SOMA) 350 MG tablet Take by mouth.     DULoxetine (CYMBALTA) 60 MG capsule Take 1 capsule (60 mg total) by mouth 2 (two) times daily. 180 capsule 2   esomeprazole (NEXIUM) 40 MG capsule Take 30- 60 min before your first and last meals of the day 60 capsule 2   fenofibrate (TRICOR) 145 MG tablet Take 1 tablet (145 mg total) by mouth daily. 90 tablet 3   ipratropium (ATROVENT HFA) 17 MCG/ACT inhaler Inhale 2 puffs into  the lungs every 6 (six) hours as needed for wheezing. 1 each 11   levocetirizine (XYZAL) 5 MG tablet 1 tablet in the evening     levothyroxine (SYNTHROID) 75 MCG tablet Take 1 tablet (75 mcg total) by mouth daily. 90 tablet 3   linaclotide (LINZESS) 72 MCG capsule Take 1 capsule (72 mcg total) by mouth daily before breakfast. 90 capsule 1   NOVOFINE PEN NEEDLE 32G X 6 MM MISC USE AS DIRECTED 100 each 3   NYSTATIN powder Apply topically.     tirzepatide (MOUNJARO) 10 MG/0.5ML Pen Inject 10 mg into the skin once a week. 6 mL 1   tiZANidine (ZANAFLEX) 4 MG tablet Take 4 mg by mouth 2 (two) times daily.     Vitamin D, Ergocalciferol, (DRISDOL) 1.25 MG (50000 UNIT) CAPS capsule Take 1 capsule (50,000 Units total) by mouth every 7 (seven) days. 12 capsule 0   No current facility-administered medications for this visit.     Musculoskeletal: Strength & Muscle Tone: na Gait & Station: na Patient leans: N/A  Psychiatric Specialty Exam: Review of Systems  Musculoskeletal:  Positive for neck pain.  Neurological:  Positive for headaches.  Psychiatric/Behavioral:  The patient is nervous/anxious.   All other systems reviewed and are negative.   There were no vitals taken for this visit.There is no height or weight on file to calculate BMI.  General Appearance: NA  Eye Contact:  NA  Speech:  Clear and Coherent  Volume:   Normal  Mood:  Anxious and Euthymic  Affect:  NA  Thought Process:  Goal Directed  Orientation:  Full (Time, Place, and Person)  Thought Content: Rumination   Suicidal Thoughts:  No  Homicidal Thoughts:  No  Memory:  Immediate;   Good Recent;   Good Remote;   Fair  Judgement:  Good  Insight:  Fair  Psychomotor Activity:  NA  Concentration:  Concentration: Good and Attention Span: Good  Recall:  Good  Fund of Knowledge: Good  Language: Good  Akathisia:  No  Handed:  Right  AIMS (if indicated): not done  Assets:  Communication Skills Desire for Improvement Resilience Social Support Talents/Skills  ADL's:  Intact  Cognition: WNL  Sleep:  Fair   Screenings: GAD-7    Flowsheet Row Office Visit from 03/13/2023 in [facility] at [place]  Total GAD-7 Score 14      PHQ2-9    Flowsheet Row Office Visit from 03/13/2023 in [facility] at [place] Video Visit from 09/26/2022 in [facility] at [place] Video Visit from 08/29/2022 in [facility] at [place] Video Visit from 07/04/2022 in [facility] at [place] Video Visit from 06/05/2022 in [facility] at [facility] Total Score 4 0 1 1 1   PHQ-9 Total Score 12 -- -- -- --      Flowsheet Row Office Visit from 03/13/2023 in [facility] at [place] Video Visit from 09/26/2022 in [facility] at [place] Video Visit from 08/29/2022 in [facility] at [place]  C-SSRS RISK CATEGORY No Risk No Risk No Risk        Assessment and Plan: This patient is a 47 year old female with a history of bipolar disorder anxiety and ADD.  She is no longer taking Xanax because of being placed on pain medication.  We will add hydroxyzine 25 mg 3 times daily as  needed for anxiety.  She  will continue Vraylar 4.5 mg daily for mood stabilization, Adderall 20 mg twice daily for ADD and Cymbalta 60 mg twice daily for depression.  She will return to see me in 2 months  Collaboration of Care: Collaboration of Care: Primary Care Provider AEB notes will be shared with PCP at patient's request  Patient/Guardian was advised Release of Information must be obtained prior to any record release in order to collaborate their care with an outside provider. Patient/Guardian was advised if they have not already done so to contact the registration department to sign all necessary forms in order for [place] to release information regarding their care.   Consent: Patient/Guardian gives verbal consent for treatment and assignment of benefits for services provided during this visit. Patient/Guardian expressed understanding and agreed to proceed.    [name], MD 07/22/2023, 10:43 AM

## 2023-07-22 NOTE — Telephone Encounter (Signed)
Patient aware.

## 2023-07-31 ENCOUNTER — Ambulatory Visit: Payer: Medicaid Other | Admitting: Nurse Practitioner

## 2023-07-31 DIAGNOSIS — E559 Vitamin D deficiency, unspecified: Secondary | ICD-10-CM

## 2023-07-31 DIAGNOSIS — Z7985 Long-term (current) use of injectable non-insulin antidiabetic drugs: Secondary | ICD-10-CM

## 2023-07-31 DIAGNOSIS — E782 Mixed hyperlipidemia: Secondary | ICD-10-CM

## 2023-07-31 DIAGNOSIS — E119 Type 2 diabetes mellitus without complications: Secondary | ICD-10-CM

## 2023-07-31 DIAGNOSIS — E039 Hypothyroidism, unspecified: Secondary | ICD-10-CM

## 2023-08-04 ENCOUNTER — Ambulatory Visit: Payer: Medicaid Other | Admitting: Nurse Practitioner

## 2023-08-04 DIAGNOSIS — E039 Hypothyroidism, unspecified: Secondary | ICD-10-CM

## 2023-08-04 DIAGNOSIS — E559 Vitamin D deficiency, unspecified: Secondary | ICD-10-CM

## 2023-08-04 DIAGNOSIS — E119 Type 2 diabetes mellitus without complications: Secondary | ICD-10-CM

## 2023-08-04 DIAGNOSIS — Z7985 Long-term (current) use of injectable non-insulin antidiabetic drugs: Secondary | ICD-10-CM

## 2023-08-07 ENCOUNTER — Ambulatory Visit (INDEPENDENT_AMBULATORY_CARE_PROVIDER_SITE_OTHER): Payer: MEDICAID | Admitting: Gastroenterology

## 2023-08-14 ENCOUNTER — Telehealth: Payer: Self-pay | Admitting: *Deleted

## 2023-08-14 ENCOUNTER — Encounter (INDEPENDENT_AMBULATORY_CARE_PROVIDER_SITE_OTHER): Payer: Self-pay | Admitting: Gastroenterology

## 2023-08-14 ENCOUNTER — Ambulatory Visit (INDEPENDENT_AMBULATORY_CARE_PROVIDER_SITE_OTHER): Payer: MEDICAID | Admitting: Gastroenterology

## 2023-08-14 VITALS — BP 102/72 | HR 96 | Temp 98.7°F | Ht 63.5 in | Wt 193.5 lb

## 2023-08-14 DIAGNOSIS — K5903 Drug induced constipation: Secondary | ICD-10-CM | POA: Diagnosis not present

## 2023-08-14 DIAGNOSIS — K219 Gastro-esophageal reflux disease without esophagitis: Secondary | ICD-10-CM | POA: Diagnosis not present

## 2023-08-14 DIAGNOSIS — K625 Hemorrhage of anus and rectum: Secondary | ICD-10-CM

## 2023-08-14 DIAGNOSIS — R09A2 Foreign body sensation, throat: Secondary | ICD-10-CM | POA: Diagnosis not present

## 2023-08-14 MED ORDER — NALOXEGOL OXALATE 12.5 MG PO TABS: 12.5000 mg | ORAL_TABLET | Freq: Every day | ORAL | 1 refills | Status: AC

## 2023-08-14 MED ORDER — ESOMEPRAZOLE MAGNESIUM 40 MG PO CPDR: 40.0000 mg | DELAYED_RELEASE_CAPSULE | Freq: Two times a day (BID) | ORAL | 3 refills | Status: AC

## 2023-08-14 NOTE — Progress Notes (Addendum)
Referring Provider: Smith Robert, MD Primary Care Physician:  Smith Robert, MD Primary GI Physician: Dr. Levon Hedger   Chief Complaint  Patient presents with   Constipation    Patient having issues with constipation. Tried linzess 72, trulance 3mg , miralax, stool softners, laxatives. When she has BM its very little and seeing some blood. Very little blood she thinks where stools are hard.    Gastroesophageal Reflux    Follow up on GERD. Takes nexium two 40mg  tablets together in the mornings.    HPI:   Catherine Howard is a 47 y.o. female with past medical history of bipolar disorder, ADHD, hyperlipidemia, GERD, hypothyroidism, diabetes   Patient presenting today for follow up of GERD and Constipation  Last seen 2022, at that time, having sticky phlegm in esophagus for years. Some dysphagia with pills and steak, taking nexium 40mg  daily. Taking hydrocodone TID, having watery to hard stools, also with some elevated LFTs.  Schedule EGD and colonoscopy, schedule LIver US, acute hep panel, linzess , continue with nexium 40mg  daily.   Labs, [place] and endoscopic procedures not completed   Last LFTs WNL in February 2024  Present:  She reports she is on more pain medication now than previously. She was given linzess by [place] previously, this worked somewhat for her prior to pain medication being increased but then stopped working. She has tried trulance, stool softeners and miralax without improvement. She notes a lot of constipation. She is having a BM maybe every other day but small volume and hard to pass. She notes some BRB mixed in with stool and on toilet tissue, this began a few days ago and has persisted, no large volume bleeding. she has been straining more recently. She is drinking some water but feels it is not enough. She has some occasional generalized abdominal discomfort, no precipitating or alleviating factors. No nausea or vomiting. No weight loss, changes in appetite. No  melena.    She is taking two nexium 40mg  at the same time, feels this is controlling things well as She has no breakthrough symptoms. She notes some phlegm upon waking in the morning, occasional cough but no sore throat. Denies any dysphagia or odynophagia   Last Colonoscopy: never  Last Endoscopy: never  Recommendations:   Past Medical History:  Diagnosis Date   ADHD    Bipolar I disorder (HCC)    Elevated cholesterol    GERD (gastroesophageal reflux disease)    Thyroid disease    Type 2 diabetes mellitus (HCC)    Vitamin D deficiency     Past Surgical History:  Procedure Laterality Date   CESAREAN SECTION     DILATION AND CURETTAGE OF UTERUS      Current Outpatient Medications  Medication Sig Dispense Refill   ACCU-CHEK AVIVA PLUS test strip SMARTSIG:Via Meter     Accu-Chek Softclix Lancets lancets SMARTSIG:Topical     amphetamine-dextroamphetamine (ADDERALL) 20 MG tablet Take 1 tablet (20 mg total) by mouth 2 (two) times daily. 60 tablet 0   amphetamine-dextroamphetamine (ADDERALL) 20 MG tablet Take 1 tablet (20 mg total) by mouth 2 (two) times daily. 60 tablet 0   amphetamine-dextroamphetamine (ADDERALL) 20 MG tablet Take 1 tablet (20 mg total) by mouth 2 (two) times daily. 60 tablet 0   Azelastine-Fluticasone 137-50 MCG/ACT SUSP Place 1 spray into the nose in the morning and at bedtime. One spray each nostril two times daily     Cariprazine HCl (VRAYLAR) 4.5 MG CAPS Take 1 capsule (4.5 mg  total) by mouth daily. 30 capsule 2   carisoprodol (SOMA) 350 MG tablet Take 350 mg by mouth. One bid     DULoxetine (CYMBALTA) 60 MG capsule Take 1 capsule (60 mg total) by mouth 2 (two) times daily. 180 capsule 2   esomeprazole (NEXIUM) 40 MG capsule Take 30- 60 min before your first and last meals of the day 60 capsule 2   fenofibrate (TRICOR) 145 MG tablet Take 1 tablet (145 mg total) by mouth daily. 90 tablet 3   HYDROcodone-acetaminophen (NORCO) 10-325 MG tablet Take 1 tablet by  mouth 2 (two) times daily as needed.     hydrOXYzine (ATARAX) 25 MG tablet Take 1 tablet (25 mg total) by mouth 3 (three) times daily as needed for anxiety. 90 tablet 2   ipratropium (ATROVENT HFA) 17 MCG/ACT inhaler Inhale 2 puffs into the lungs every 6 (six) hours as needed for wheezing. 1 each 11   levocetirizine (XYZAL) 5 MG tablet 1 tablet in the evening     levothyroxine (SYNTHROID) 75 MCG tablet Take 1 tablet (75 mcg total) by mouth daily. 90 tablet 3   NOVOFINE PEN NEEDLE 32G X 6 MM MISC USE AS DIRECTED 100 each 3   NYSTATIN powder Apply topically.     tirzepatide (MOUNJARO) 10 MG/0.5ML Pen Inject 10 mg into the skin once a week. 6 mL 1   Vitamin D, Ergocalciferol, (DRISDOL) 1.25 MG (50000 UNIT) CAPS capsule Take 1 capsule (50,000 Units total) by mouth every 7 (seven) days. 12 capsule 0   No current facility-administered medications for this visit.    Allergies as of 08/14/2023   (No Known Allergies)    Family History  Problem Relation Age of Onset   Schizophrenia Father    Alcohol abuse Father    Diabetes Father    Hyperlipidemia Father    Alcohol abuse Paternal Uncle     Social History   Socioeconomic History   Marital status: Single    Spouse name: Not on file   Number of children: Not on file   Years of education: Not on file   Highest education level: Not on file  Occupational History   Not on file  Tobacco Use   Smoking status: Every Day    Current packs/day: 2.00    Average packs/day: 2.0 packs/day for 32.0 years (64.0 ttl pk-yrs)    Types: Cigarettes   Smokeless tobacco: Never   Tobacco comments:    smokes 3/4 of a pack a day--01/22/2021  Vaping Use   Vaping status: Former  Substance and Sexual Activity   Alcohol use: No    Alcohol/week: 0.0 standard drinks of alcohol   Drug use: No   Sexual activity: Yes    Partners: Male    Birth control/protection: None  Other Topics Concern   Not on file  Social History Narrative   Not on file   Social  Determinants of Health   Financial Resource Strain: Not on file  Food Insecurity: Not on file  Transportation Needs: Not on file  Physical Activity: Not on file  Stress: Not on file  Social Connections: Not on file   Review of systems General: negative for malaise, night sweats, fever, chills, weight loss Neck: Negative for lumps, goiter, pain and significant neck swelling Resp: Negative for cough, wheezing, dyspnea at rest CV: Negative for chest pain, leg swelling, palpitations, orthopnea GI: denies melena,  nausea, vomiting, diarrhea,dysphagia, odyonophagia, early satiety or unintentional weight loss. +constipation +rectal bleeding +globus sensation +generalized  abdominal discomfort MSK: Negative for joint pain or swelling, back pain, and muscle pain. Derm: Negative for itching or rash Psych: Denies depression, anxiety, memory loss, confusion. No homicidal or suicidal ideation.  Heme: Negative for prolonged bleeding, bruising easily, and swollen nodes. Endocrine: Negative for cold or heat intolerance, polyuria, polydipsia and goiter. Neuro: negative for tremor, gait imbalance, syncope and seizures. The remainder of the review of systems is noncontributory.  Physical Exam: There were no vitals taken for this visit. General:   Alert and oriented. No distress noted. Pleasant and cooperative.  Head:  Normocephalic and atraumatic. Eyes:  Conjuctiva clear without scleral icterus. Mouth:  Oral mucosa pink and moist. Good dentition. No lesions. Heart: Normal rate and rhythm, s1 and s2 heart sounds present.  Lungs: Clear lung sounds in all lobes. Respirations equal and unlabored. Abdomen:  +BS, soft, non-tender and non-distended. No rebound or guarding. No HSM or masses noted. Derm: No palmar erythema or jaundice Msk:  Symmetrical without gross deformities. Normal posture. Extremities:  Without edema. Neurologic:  Alert and  oriented x4 Psych:  Alert and cooperative. Normal mood and  affect.  Invalid input(s): "6 MONTHS"   ASSESSMENT: Jovani Entwistle is a 47 y.o. female presenting today for follow up of GERD and Constipation  GERD/globus sensation: denies breakthrough acid regurgitation or heartburn, though is having a globus sensation upon waking, sometimes a dry cough. Notably taking both nexium pills in the morning. Query if she is having some silent reflux overnight. She denies any dysphagia. Recommend she split nexium dose, take one in the am and the second in the evening, needs to implement good reflux precautions. As she has no dysphagia or other alarm symptoms, will hold off on EGD for now, if symptoms persist despite change in PPI timing and implementation of reflux precautions, will discuss EGD for further evaluation.  Constipation: on norco and also now on soma, she notes worsening constipation since starting soma. Having smaller, hard stools every other day, some recent rectal bleeding. Has tried linzess , trulance, miralax and stool softeners without improvement. Will start Movantik 12.5mg  daily given she is on opiates. Also recommend proceeding with colonoscopy given rectal bleeding and in light of no previous CRC screenings. Indications, risks and benefits of procedure discussed in detail with patient. Patient verbalized understanding and is in agreement to proceed with Colonoscopy.   PLAN:  Movantik 12.5mg  Daily  2. Schedule Colonoscopy-ASA III 2 day prep 3. Increase water intake, aim for atleast 64 oz per day Increase fruits, veggies and whole grains, kiwi and prunes are especially good for constipation 4. Separate nexium doses, one in morning and one in evening 5. Good reflux precautions  All questions were answered, patient verbalized understanding and is in agreement with plan as outlined above.   Follow Up: 4 months   Sravya Grissom L. Jeanmarie Hubert, MSN, APRN, AGNP-C Adult-Gerontology Nurse Practitioner [facility]  I have reviewed  the note and agree with the APP's assessment as described in this progress note  Katrinka Blazing, MD Gastroenterology and Hepatology [facility] Gastroenterology

## 2023-08-14 NOTE — Patient Instructions (Signed)
Let's try taking one nexium in the morning and the second dose in the evening Be mindful of greasy, spicy, fried, citrus foods, caffeine, carbonated drinks, chocolate and alcohol as these can increase reflux symptoms Stay upright 2-3 hours after eating, prior to lying down and avoid eating late in the evenings.  Increase water intake, aim for atleast 64 oz per day Increase fruits, veggies and whole grains, kiwi and prunes are especially good for constipation We will start movantik 12.5mg  daily to help with constipation as well  We will get you scheduled for colonoscopy  Follow up 4 months  It was a pleasure to see you today. I want to create trusting relationships with patients and provide genuine, compassionate, and quality care. I truly value your feedback! please be on the lookout for a survey regarding your visit with me today. I appreciate your input about our visit and your time in completing this!    Suhana Wilner L. Jeanmarie Hubert, MSN, APRN, AGNP-C Adult-Gerontology Nurse Practitioner [facility]astroenterology at [facility]

## 2023-08-14 NOTE — Telephone Encounter (Signed)
LMTRC  TCS, ASA 3. Dr.Castaneda Needs 2 day prep

## 2023-08-18 ENCOUNTER — Telehealth (INDEPENDENT_AMBULATORY_CARE_PROVIDER_SITE_OTHER): Payer: Self-pay

## 2023-08-18 NOTE — Telephone Encounter (Signed)
Movantik 12.5 Approved by patient insurance through 08/13/2024.

## 2023-08-26 ENCOUNTER — Encounter: Payer: Self-pay | Admitting: *Deleted

## 2023-08-26 NOTE — Telephone Encounter (Signed)
LMTRC..mailed letter 

## 2023-09-12 ENCOUNTER — Telehealth (HOSPITAL_COMMUNITY): Payer: Self-pay | Admitting: *Deleted

## 2023-09-12 NOTE — Telephone Encounter (Signed)
Per pt she do not think her Cymbalta is working. She have been having manic episodes. Per pt all her fiance wants to do is argue. Per pt she thinks about hurting feelings. Per pt she have been on the lithium a long time ago but don't know if provider can put her back on it or not. Per pt the Vyralar is working but knows the Cymbalta is not.

## 2023-09-15 NOTE — Telephone Encounter (Signed)
Informed patient and an appt was made.

## 2023-09-22 ENCOUNTER — Ambulatory Visit: Payer: Medicaid Other | Admitting: Nurse Practitioner

## 2023-09-22 ENCOUNTER — Telehealth (INDEPENDENT_AMBULATORY_CARE_PROVIDER_SITE_OTHER): Payer: MEDICAID | Admitting: Psychiatry

## 2023-09-22 ENCOUNTER — Other Ambulatory Visit: Payer: Self-pay | Admitting: Nurse Practitioner

## 2023-09-22 ENCOUNTER — Encounter (HOSPITAL_COMMUNITY): Payer: Self-pay | Admitting: Psychiatry

## 2023-09-22 DIAGNOSIS — E039 Hypothyroidism, unspecified: Secondary | ICD-10-CM

## 2023-09-22 DIAGNOSIS — F902 Attention-deficit hyperactivity disorder, combined type: Secondary | ICD-10-CM | POA: Diagnosis not present

## 2023-09-22 DIAGNOSIS — F3162 Bipolar disorder, current episode mixed, moderate: Secondary | ICD-10-CM

## 2023-09-22 DIAGNOSIS — E119 Type 2 diabetes mellitus without complications: Secondary | ICD-10-CM

## 2023-09-22 DIAGNOSIS — E559 Vitamin D deficiency, unspecified: Secondary | ICD-10-CM

## 2023-09-22 DIAGNOSIS — E782 Mixed hyperlipidemia: Secondary | ICD-10-CM

## 2023-09-22 MED ORDER — AMPHETAMINE-DEXTROAMPHETAMINE 20 MG PO TABS: 20.0000 mg | ORAL_TABLET | Freq: Two times a day (BID) | ORAL | 0 refills | Status: DC

## 2023-09-22 MED ORDER — VRAYLAR 4.5 MG PO CAPS: 1.0000 | ORAL_CAPSULE | Freq: Every day | ORAL | 2 refills | Status: DC

## 2023-09-22 MED ORDER — DULOXETINE HCL 60 MG PO CPEP: 60.0000 mg | ORAL_CAPSULE | Freq: Two times a day (BID) | ORAL | 2 refills | Status: DC

## 2023-09-22 MED ORDER — HYDROXYZINE HCL 25 MG PO TABS: 25.0000 mg | ORAL_TABLET | Freq: Three times a day (TID) | ORAL | 2 refills | Status: DC | PRN

## 2023-09-22 NOTE — Progress Notes (Signed)
Virtual Visit via Video Note  I connected with Catherine Howard on 09/22/23 at 11:20 AM EDT by a video enabled telemedicine application and verified that I am speaking with the correct person using two identifiers.  Location: Patient: home Provider: office   I discussed the limitations of evaluation and management by telemedicine and the availability of in person appointments. The patient expressed understanding and agreed to proceed.     I discussed the assessment and treatment plan with the patient. The patient was provided an opportunity to ask questions and all were answered. The patient agreed with the plan and demonstrated an understanding of the instructions.   The patient was advised to call back or seek an in-person evaluation if the symptoms worsen or if the condition fails to improve as anticipated.  I provided 20 minutes of non-face-to-face time during this encounter.   [name], MD  [facility] MD/PA/NP OP Progress Note  09/22/2023 11:41 AM Catherine Howard  MRN:  [id number]  Chief Complaint:  Chief Complaint  Patient presents with   Depression   Anxiety   Manic Behavior   Follow-up   HPI: This patient is a 47 year old divorced white female who lives with her fianc, 42 year old daughter and 73 year old son in [place]. Her 67 year old son died of a drug overdose in 10-29-2013. The patient does not work and is on disability   The patient returns for follow-up after about 2 months regarding her bipolar disorder anxiety and ADHD.  She had called earlier this week stating that "the Cymbalta is not working."  She states that she is angry and irritable all the time.  She denies being depressed or suicidal.  She states that she and her fianc are constantly arguing and fighting.  She states that he is "trying to control me."  Apparently is telling her who she can and cannot talk to  on her phone.  It turns out that both of them are seeking out other partners on the phone and this is caused a  lot of conflict.  I explained that the situation cannot be solved through medication management and that they needed to sit down and decide if they wanted the relationship to continue.  She is not having the anger and irritability with other people.  This seems to be mostly a conflict between the patient and fianc.  She states that she does not feel that she is in any danger and that her fianc has never been violent or agitated and does not use drugs or alcohol.   Visit Diagnosis:    ICD-10-CM   1. Bipolar 1 disorder, mixed, moderate (HCC)  F31.62     2. Attention deficit hyperactivity disorder (ADHD), combined type  F90.2       Past Psychiatric History: Outpatient treatment for the last several years  Past Medical History:  Past Medical History:  Diagnosis Date   ADHD    Bipolar I disorder (HCC)    Elevated cholesterol    GERD (gastroesophageal reflux disease)    Thyroid disease    Type 2 diabetes mellitus (HCC)    Vitamin D deficiency     Past Surgical History:  Procedure Laterality Date   CESAREAN SECTION     DILATION AND CURETTAGE OF UTERUS      Family Psychiatric History: See below  Family History:  Family History  Problem Relation Age of Onset   Schizophrenia Father    Alcohol abuse Father    Diabetes Father    Hyperlipidemia Father  Alcohol abuse Paternal Uncle     Social History:  Social History   Socioeconomic History   Marital status: Single    Spouse name: Not on file   Number of children: Not on file   Years of education: Not on file   Highest education level: Not on file  Occupational History   Not on file  Tobacco Use   Smoking status: Every Day    Current packs/day: 2.00    Average packs/day: 2.0 packs/day for 32.0 years (64.0 ttl pk-yrs)    Types: Cigarettes   Smokeless tobacco: Never   Tobacco comments:    smokes 3/4 of a pack a day--01/22/2021  Vaping Use   Vaping status: Former  Substance and Sexual Activity   Alcohol use: No     Alcohol/week: 0.0 standard drinks of alcohol   Drug use: No   Sexual activity: Yes    Partners: Male    Birth control/protection: None  Other Topics Concern   Not on file  Social History Narrative   Not on file   Social Determinants of Health   Financial Resource Strain: Not on file  Food Insecurity: Not on file  Transportation Needs: Not on file  Physical Activity: Not on file  Stress: Not on file  Social Connections: Not on file    Allergies: No Known Allergies  Metabolic Disorder Labs: Lab Results  Component Value Date   HGBA1C 6.0 02/06/2023   No results found for: "PROLACTIN" Lab Results  Component Value Date   CHOL 137 02/06/2023   TRIG 95 02/06/2023   HDL 55 02/06/2023   CHOLHDL 5.7 (H) 10/17/2021   LDLCALC 64 02/06/2023   LDLCALC 124 (H) 10/17/2021   Lab Results  Component Value Date   TSH 1.400 06/09/2023   TSH 1.82 02/06/2023    Therapeutic Level Labs: Lab Results  Component Value Date   LITHIUM 0.40 (L) 07/14/2014   No results found for: "VALPROATE" No results found for: "CBMZ"  Current Medications: Current Outpatient Medications  Medication Sig Dispense Refill   ACCU-CHEK AVIVA PLUS test strip SMARTSIG:Via Meter     Accu-Chek Softclix Lancets lancets SMARTSIG:Topical     amphetamine-dextroamphetamine (ADDERALL) 20 MG tablet Take 1 tablet (20 mg total) by mouth 2 (two) times daily. 60 tablet 0   amphetamine-dextroamphetamine (ADDERALL) 20 MG tablet Take 1 tablet (20 mg total) by mouth 2 (two) times daily. 60 tablet 0   amphetamine-dextroamphetamine (ADDERALL) 20 MG tablet Take 1 tablet (20 mg total) by mouth 2 (two) times daily. 60 tablet 0   Azelastine-Fluticasone 137-50 MCG/ACT SUSP Place 1 spray into the nose in the morning and at bedtime. One spray each nostril two times daily     Cariprazine HCl (VRAYLAR) 4.5 MG CAPS Take 1 capsule (4.5 mg total) by mouth daily. 30 capsule 2   carisoprodol (SOMA) 350 MG tablet Take 350 mg by mouth. One bid      DULoxetine (CYMBALTA) 60 MG capsule Take 1 capsule (60 mg total) by mouth 2 (two) times daily. 180 capsule 2   esomeprazole (NEXIUM) 40 MG capsule Take 1 capsule (40 mg total) by mouth 2 (two) times daily. Take 30- 60 min before your first and last meals of the day 180 capsule 3   fenofibrate (TRICOR) 145 MG tablet Take 1 tablet (145 mg total) by mouth daily. 90 tablet 3   HYDROcodone-acetaminophen (NORCO) 10-325 MG tablet Take 1 tablet by mouth 2 (two) times daily as needed.     hydrOXYzine (ATARAX)  25 MG tablet Take 1 tablet (25 mg total) by mouth 3 (three) times daily as needed for anxiety. 90 tablet 2   ipratropium (ATROVENT HFA) 17 MCG/ACT inhaler Inhale 2 puffs into the lungs every 6 (six) hours as needed for wheezing. 1 each 11   levocetirizine (XYZAL) 5 MG tablet 1 tablet in the evening     levothyroxine (SYNTHROID) 75 MCG tablet Take 1 tablet (75 mcg total) by mouth daily. 90 tablet 3   naloxegol oxalate (MOVANTIK) 12.5 MG TABS tablet Take 1 tablet (12.5 mg total) by mouth daily. 30 tablet 1   NOVOFINE PEN NEEDLE 32G X 6 MM MISC USE AS DIRECTED 100 each 3   NYSTATIN powder Apply topically.     tirzepatide (MOUNJARO) 10 MG/0.5ML Pen Inject 10 mg into the skin once a week. 6 mL 1   Vitamin D, Ergocalciferol, (DRISDOL) 1.25 MG (50000 UNIT) CAPS capsule Take 1 capsule (50,000 Units total) by mouth every 7 (seven) days. 12 capsule 0   No current facility-administered medications for this visit.     Musculoskeletal: Strength & Muscle Tone: within normal limits Gait & Station: normal Patient leans: N/A  Psychiatric Specialty Exam: Review of Systems  Psychiatric/Behavioral:         Irritability  All other systems reviewed and are negative.   There were no vitals taken for this visit.There is no height or weight on file to calculate BMI.  General Appearance: Casual and Fairly Groomed  Eye Contact:  Good  Speech:  Clear and Coherent  Volume:  Normal  Mood:  Irritable  Affect:   Congruent  Thought Process:  Goal Directed  Orientation:  Full (Time, Place, and Person)  Thought Content: Rumination   Suicidal Thoughts:  No  Homicidal Thoughts:  No  Memory:  Immediate;   Good Recent;   Good Remote;   NA  Judgement:  Fair  Insight:  Shallow  Psychomotor Activity:  Normal  Concentration:  Concentration: Good and Attention Span: Good  Recall:  Good  Fund of Knowledge: Good  Language: Good  Akathisia:  No  Handed:  Right  AIMS (if indicated): not done  Assets:  Communication Skills Desire for Improvement Resilience Social Support  ADL's:  Intact  Cognition: WNL  Sleep:  Good   Screenings: GAD-7    Flowsheet Row Office Visit from 03/13/2023 in [facility] [place]  Total GAD-7 Score 14      PHQ2-9    Flowsheet Row Office Visit from 03/13/2023 in [facility] [place] Video Visit from 09/26/2022 in [facility] at [place] Video Visit from 08/29/2022 in [facility] at [place] Video Visit from 07/04/2022 in [facility] at [place] Video Visit from 06/05/2022 in [facility] at [facility] Total Score 4 0 1 1 1   PHQ-9 Total Score 12 -- -- -- --      Flowsheet Row Office Visit from 03/13/2023 in [facility] at [place] Video Visit from 09/26/2022 in [facility] at [place] Video Visit from 08/29/2022 in [facility] at [place]  C-SSRS RISK CATEGORY No Risk No Risk No Risk        Assessment and Plan: This patient is a 47 year old female with a history of bipolar disorder anxiety and ADD.  She and her fianc are not getting along and seem to want to see other people.  I do not see this is a medication issue but rather something they need to figure out between themselves.  Therefore for  now she will continue hydroxyzine 25 mg 3 times daily as needed for anxiety, Vraylar 4.5 mg daily for mood stabilization, Adderall 20 mg twice daily for ADD and Cymbalta 60 mg twice daily for depression.  She will return to see me in 4 weeks  Collaboration of Care: Collaboration of Care: Primary Care Provider AEB notes will be shared with PCP at patient's request  Patient/Guardian was advised Release of Information must be obtained prior to any record release in order to collaborate their care with an outside provider. Patient/Guardian was advised if they have not already done so to contact the registration department to sign all necessary forms in order for [place] to release information regarding their care.   Consent: Patient/Guardian gives verbal consent for treatment and assignment of benefits for services provided during this visit. Patient/Guardian expressed understanding and agreed to proceed.    [name], MD 09/22/2023, 11:41 AM

## 2023-09-23 ENCOUNTER — Telehealth (HOSPITAL_COMMUNITY): Payer: No Typology Code available for payment source | Admitting: Psychiatry

## 2023-09-29 ENCOUNTER — Telehealth: Payer: Self-pay | Admitting: *Deleted

## 2023-09-29 NOTE — Telephone Encounter (Signed)
Catherine Howard left a Engineer, technical sales. She sates that the Greggory Keen is not working as it once was. She saw on TV , the Zepbound for weight loss. She is asking if you thought this would be safe for her and if you did, would you send a prescription in for it. She does not know if her insurance will cover it.

## 2023-09-29 NOTE — Telephone Encounter (Signed)
It is the exact same medication as Mounjaro (just with a different name) targeted for weight loss.

## 2023-09-29 NOTE — Telephone Encounter (Signed)
Patient was called and a message was left with this information on it.

## 2023-10-06 ENCOUNTER — Other Ambulatory Visit (HOSPITAL_COMMUNITY): Payer: Self-pay | Admitting: Psychiatry

## 2023-10-06 ENCOUNTER — Other Ambulatory Visit: Payer: Self-pay | Admitting: Nurse Practitioner

## 2023-10-06 ENCOUNTER — Telehealth: Payer: Self-pay | Admitting: Pulmonary Disease

## 2023-10-06 NOTE — Telephone Encounter (Signed)
Spoke with Catherine Howard/[employer]. She states patient is eligible for new machine as of 09/20/23. Patient will have to complete new sleep study and have OV to have covered by insurance, patient has [employer] Managed Care plan. This will be true regardless of DME company, as this is required by insurance for approval of CPAP machine.   Last sleep study 2019, last OSA OV 01/22/21.  Spoke with patient and she is aware she needs OV and repeat sleep study. Patient would like to schedule OV ASAP with any available sleep provider.  Please contact patient to schedule. Thank you!

## 2023-10-06 NOTE — Telephone Encounter (Signed)
Patient states needs order for CPAP machine to go to [facility]. Patient phone number is [phone].

## 2023-10-10 ENCOUNTER — Other Ambulatory Visit (HOSPITAL_COMMUNITY): Payer: Self-pay | Admitting: Psychiatry

## 2023-10-16 ENCOUNTER — Telehealth: Payer: Self-pay | Admitting: *Deleted

## 2023-10-16 NOTE — Telephone Encounter (Signed)
Will need a follow up in office before advancing the dose.  I last saw her in February.

## 2023-10-16 NOTE — Telephone Encounter (Signed)
Patient was called and made aware. 

## 2023-10-16 NOTE — Telephone Encounter (Signed)
Patient left a message . She has appointment with you November 12,2024 at 3 pm. She is asking if you would increase her Mounjaro dose. She has been on the same one for a while. She weighed this morning and it was180 pounds

## 2023-10-20 ENCOUNTER — Telehealth (HOSPITAL_COMMUNITY): Payer: MEDICAID | Admitting: Psychiatry

## 2023-10-21 ENCOUNTER — Encounter (HOSPITAL_COMMUNITY): Payer: Self-pay | Admitting: Psychiatry

## 2023-10-21 ENCOUNTER — Telehealth (HOSPITAL_COMMUNITY): Payer: MEDICAID | Admitting: Psychiatry

## 2023-10-21 DIAGNOSIS — F3162 Bipolar disorder, current episode mixed, moderate: Secondary | ICD-10-CM | POA: Diagnosis not present

## 2023-10-21 DIAGNOSIS — F902 Attention-deficit hyperactivity disorder, combined type: Secondary | ICD-10-CM

## 2023-10-21 MED ORDER — VRAYLAR 4.5 MG PO CAPS: 1.0000 | ORAL_CAPSULE | Freq: Every day | ORAL | 2 refills | Status: DC

## 2023-10-21 MED ORDER — DULOXETINE HCL 60 MG PO CPEP: 60.0000 mg | ORAL_CAPSULE | Freq: Two times a day (BID) | ORAL | 2 refills | Status: DC

## 2023-10-21 MED ORDER — AMPHETAMINE-DEXTROAMPHETAMINE 20 MG PO TABS: 20.0000 mg | ORAL_TABLET | Freq: Two times a day (BID) | ORAL | 0 refills | Status: DC

## 2023-10-21 MED ORDER — HYDROXYZINE HCL 25 MG PO TABS: 25.0000 mg | ORAL_TABLET | Freq: Three times a day (TID) | ORAL | 2 refills | Status: DC | PRN

## 2023-10-21 NOTE — Progress Notes (Signed)
Virtual Visit via Telephone Note  I connected with Catherine Howard on 10/21/23 at  3:40 PM EDT by telephone and verified that I am speaking with the correct person using two identifiers.  Location: Patient: home Provider: office   I discussed the limitations, risks, security and privacy concerns of performing an evaluation and management service by telephone and the availability of in person appointments. I also discussed with the patient that there may be a patient responsible charge related to this service. The patient expressed understanding and agreed to proceed.     I discussed the assessment and treatment plan with the patient. The patient was provided an opportunity to ask questions and all were answered. The patient agreed with the plan and demonstrated an understanding of the instructions.   The patient was advised to call back or seek an in-person evaluation if the symptoms worsen or if the condition fails to improve as anticipated.  I provided 20 minutes of non-face-to-face time during this encounter.   [name], MD  [facility] MD/PA/NP OP Progress Note  10/21/2023 3:55 PM Catherine Howard  MRN:  [id number]  Chief Complaint:  Chief Complaint  Patient presents with   Anxiety   Depression   Manic Behavior   Follow-up   ADD   HPI: This patient is a 47 year old divorced white female who lives with her fianc and son in [place].  She had a 68 year old son died of a drug overdose in 2013/11/22.  The patient does not work and is on disability.  The patient returns for follow-up after 4 weeks regarding her bipolar disorder anxiety and ADHD.  Last time she and her fianc were having a lot of conflicts.  They were both trying to connect with other people on the phone and they found out about this.  They were angry and irritable all the time.  She states that things are better and they are getting along better.  She states that her fianc has gotten Xanax from his doctor and he is easier to  live with.  She does state that she is still often irritable but it seems to be related to her chronic back pain.  She does take Soma and Norco for her neck pain but she does not think it is helping her back pain.  She is supposed to see an orthopedic in a couple of weeks.  I suggested we hold off on medication changes until she gets the pain manage is a little bit better and she is in agreement.  She is sleeping well and denies thoughts of self-harm or suicide.  She Visit Diagnosis:    ICD-10-CM   1. Bipolar 1 disorder, mixed, moderate (HCC)  F31.62     2. Attention deficit hyperactivity disorder (ADHD), combined type  F90.2       Past Psychiatric History: Outpatient treatment for the last several years  Past Medical History:  Past Medical History:  Diagnosis Date   ADHD    Bipolar I disorder (HCC)    Elevated cholesterol    GERD (gastroesophageal reflux disease)    Thyroid disease    Type 2 diabetes mellitus (HCC)    Vitamin D deficiency     Past Surgical History:  Procedure Laterality Date   CESAREAN SECTION     DILATION AND CURETTAGE OF UTERUS      Family Psychiatric History: See below  Family History:  Family History  Problem Relation Age of Onset   Schizophrenia Father    Alcohol abuse Father  Diabetes Father    Hyperlipidemia Father    Alcohol abuse Paternal Uncle     Social History:  Social History   Socioeconomic History   Marital status: Single    Spouse name: Not on file   Number of children: Not on file   Years of education: Not on file   Highest education level: Not on file  Occupational History   Not on file  Tobacco Use   Smoking status: Every Day    Current packs/day: 2.00    Average packs/day: 2.0 packs/day for 32.0 years (64.0 ttl pk-yrs)    Types: Cigarettes   Smokeless tobacco: Never   Tobacco comments:    smokes 3/4 of a pack a day--01/22/2021  Vaping Use   Vaping status: Former  Substance and Sexual Activity   Alcohol use: No     Alcohol/week: 0.0 standard drinks of alcohol   Drug use: No   Sexual activity: Yes    Partners: Male    Birth control/protection: None  Other Topics Concern   Not on file  Social History Narrative   Not on file   Social Determinants of Health   Financial Resource Strain: Not on file  Food Insecurity: Not on file  Transportation Needs: Not on file  Physical Activity: Not on file  Stress: Not on file  Social Connections: Not on file    Allergies: No Known Allergies  Metabolic Disorder Labs: Lab Results  Component Value Date   HGBA1C 6.0 02/06/2023   No results found for: "PROLACTIN" Lab Results  Component Value Date   CHOL 137 02/06/2023   TRIG 95 02/06/2023   HDL 55 02/06/2023   CHOLHDL 5.7 (H) 10/17/2021   LDLCALC 64 02/06/2023   LDLCALC 124 (H) 10/17/2021   Lab Results  Component Value Date   TSH 1.400 06/09/2023   TSH 1.82 02/06/2023    Therapeutic Level Labs: Lab Results  Component Value Date   LITHIUM 0.40 (L) 07/14/2014   No results found for: "VALPROATE" No results found for: "CBMZ"  Current Medications: Current Outpatient Medications  Medication Sig Dispense Refill   ACCU-CHEK AVIVA PLUS test strip SMARTSIG:Via Meter     Accu-Chek Softclix Lancets lancets SMARTSIG:Topical     amphetamine-dextroamphetamine (ADDERALL) 20 MG tablet Take 1 tablet (20 mg total) by mouth 2 (two) times daily. 60 tablet 0   amphetamine-dextroamphetamine (ADDERALL) 20 MG tablet Take 1 tablet (20 mg total) by mouth 2 (two) times daily. 60 tablet 0   amphetamine-dextroamphetamine (ADDERALL) 20 MG tablet Take 1 tablet (20 mg total) by mouth 2 (two) times daily. 60 tablet 0   Azelastine-Fluticasone 137-50 MCG/ACT SUSP Place 1 spray into the nose in the morning and at bedtime. One spray each nostril two times daily     Cariprazine HCl (VRAYLAR) 4.5 MG CAPS Take 1 capsule (4.5 mg total) by mouth daily. 30 capsule 2   carisoprodol (SOMA) 350 MG tablet Take 350 mg by mouth. One bid      DULoxetine (CYMBALTA) 60 MG capsule Take 1 capsule (60 mg total) by mouth 2 (two) times daily. 180 capsule 2   esomeprazole (NEXIUM) 40 MG capsule Take 1 capsule (40 mg total) by mouth 2 (two) times daily. Take 30- 60 min before your first and last meals of the day 180 capsule 3   fenofibrate (TRICOR) 145 MG tablet Take 1 tablet (145 mg total) by mouth daily. 90 tablet 3   HYDROcodone-acetaminophen (NORCO) 10-325 MG tablet Take 1 tablet by mouth 2 (two)  times daily as needed.     hydrOXYzine (ATARAX) 25 MG tablet Take 1 tablet (25 mg total) by mouth 3 (three) times daily as needed for anxiety. 90 tablet 2   ipratropium (ATROVENT HFA) 17 MCG/ACT inhaler Inhale 2 puffs into the lungs every 6 (six) hours as needed for wheezing. 1 each 11   levocetirizine (XYZAL) 5 MG tablet 1 tablet in the evening     levothyroxine (SYNTHROID) 75 MCG tablet Take 1 tablet (75 mcg total) by mouth daily. 90 tablet 3   naloxegol oxalate (MOVANTIK) 12.5 MG TABS tablet Take 1 tablet (12.5 mg total) by mouth daily. 30 tablet 1   NOVOFINE PEN NEEDLE 32G X 6 MM MISC USE AS DIRECTED 100 each 3   NYSTATIN powder Apply topically.     tirzepatide (MOUNJARO) 10 MG/0.5ML Pen Inject 10 mg into the skin once a week. 6 mL 1   Vitamin D, Ergocalciferol, (DRISDOL) 1.25 MG (50000 UNIT) CAPS capsule TAKE ONE CAPSULE BY MOUTH EVERY 7 DAYS 12 capsule 0   No current facility-administered medications for this visit.     Musculoskeletal: Strength & Muscle Tone: na Gait & Station: na Patient leans: N/A  Psychiatric Specialty Exam: Review of Systems  Musculoskeletal:  Positive for back pain and neck pain.  Neurological:  Positive for headaches.  All other systems reviewed and are negative.   There were no vitals taken for this visit.There is no height or weight on file to calculate BMI.  General Appearance: NA  Eye Contact:  NA  Speech:  Clear and Coherent  Volume:  Normal  Mood:  Dysphoric  Affect:  NA  Thought Process:   Goal Directed  Orientation:  Full (Time, Place, and Person)  Thought Content: Rumination   Suicidal Thoughts:  No  Homicidal Thoughts:  No  Memory:  Immediate;   Good Recent;   Good Remote;   NA  Judgement:  Fair  Insight:  Fair  Psychomotor Activity:  Decreased  Concentration:  Concentration: Good and Attention Span: Good  Recall:  Good  Fund of Knowledge: Fair  Language: Good  Akathisia:  No  Handed:  Right  AIMS (if indicated): not done  Assets:  Communication Skills Desire for Improvement Resilience Social Support  ADL's:  Intact  Cognition: WNL  Sleep:  Good   Screenings: GAD-7    Flowsheet Row Office Visit from 03/13/2023 in [facility] [place]  Total GAD-7 Score 14      PHQ2-9    Flowsheet Row Office Visit from 03/13/2023 in [facility] [place] Video Visit from 09/26/2022 in [facility] at [place] Video Visit from 08/29/2022 in [facility] at [place] Video Visit from 07/04/2022 in [facility] at [place] Video Visit from 06/05/2022 in [facility] at [facility] Total Score 4 0 1 1 1   PHQ-9 Total Score 12 -- -- -- --      Flowsheet Row Office Visit from 03/13/2023 in [facility] at [place] Video Visit from 09/26/2022 in [facility] at [place] Video Visit from 08/29/2022 in [facility] at [place]  C-SSRS RISK CATEGORY No Risk No Risk No Risk        Assessment and Plan: This patient is a 47 year old female with a history of bipolar disorder anxiety and ADD.  She states that she is more irritable lately but a  lot of it may have to do with her back pain.  We agreed to wait to make any medication changes until she can get the pain under better control.  She will continue hydroxyzine 25  mg 3 times daily as needed for anxiety, Vraylar 4.5 mg daily for mood stabilization, Adderall 20 mg twice daily for ADD and Cymbalta 60 mg twice daily for depression.  She will return to see me in 2 months  Collaboration of Care: Collaboration of Care: Primary Care Provider AEB notes will be shared with PCP at patient's request  Patient/Guardian was advised Release of Information must be obtained prior to any record release in order to collaborate their care with an outside provider. Patient/Guardian was advised if they have not already done so to contact the registration department to sign all necessary forms in order for [place] to release information regarding their care.   Consent: Patient/Guardian gives verbal consent for treatment and assignment of benefits for services provided during this visit. Patient/Guardian expressed understanding and agreed to proceed.    [name], MD 10/21/2023, 3:55 PM

## 2023-11-04 ENCOUNTER — Ambulatory Visit: Payer: Medicaid Other | Admitting: Nurse Practitioner

## 2023-11-04 DIAGNOSIS — E039 Hypothyroidism, unspecified: Secondary | ICD-10-CM

## 2023-11-04 DIAGNOSIS — E119 Type 2 diabetes mellitus without complications: Secondary | ICD-10-CM

## 2023-11-04 DIAGNOSIS — E782 Mixed hyperlipidemia: Secondary | ICD-10-CM

## 2023-11-04 DIAGNOSIS — F17200 Nicotine dependence, unspecified, uncomplicated: Secondary | ICD-10-CM

## 2023-11-04 DIAGNOSIS — E559 Vitamin D deficiency, unspecified: Secondary | ICD-10-CM

## 2023-11-05 ENCOUNTER — Ambulatory Visit (INDEPENDENT_AMBULATORY_CARE_PROVIDER_SITE_OTHER): Payer: MEDICAID | Admitting: Nurse Practitioner

## 2023-11-05 ENCOUNTER — Encounter: Payer: Self-pay | Admitting: Nurse Practitioner

## 2023-11-05 VITALS — BP 106/72 | HR 102 | Ht 63.5 in | Wt 194.0 lb

## 2023-11-05 DIAGNOSIS — E119 Type 2 diabetes mellitus without complications: Secondary | ICD-10-CM

## 2023-11-05 DIAGNOSIS — E782 Mixed hyperlipidemia: Secondary | ICD-10-CM

## 2023-11-05 DIAGNOSIS — E559 Vitamin D deficiency, unspecified: Secondary | ICD-10-CM | POA: Diagnosis not present

## 2023-11-05 DIAGNOSIS — Z7985 Long-term (current) use of injectable non-insulin antidiabetic drugs: Secondary | ICD-10-CM | POA: Diagnosis not present

## 2023-11-05 DIAGNOSIS — E039 Hypothyroidism, unspecified: Secondary | ICD-10-CM | POA: Diagnosis not present

## 2023-11-05 LAB — COMPREHENSIVE METABOLIC PANEL
ALT: 22 [IU]/L (ref 0–32)
AST: 18 [IU]/L (ref 0–40)
Albumin: 4.2 g/dL (ref 3.9–4.9)
Alkaline Phosphatase: 128 [IU]/L — ABNORMAL HIGH (ref 44–121)
BUN/Creatinine Ratio: 7 — ABNORMAL LOW (ref 9–23)
BUN: 7 mg/dL (ref 6–24)
Bilirubin Total: <0.2 mg/dL (ref 0.0–1.2)
CO2: 22 mmol/L (ref 20–29)
Calcium: 9.6 mg/dL (ref 8.7–10.2)
Chloride: 101 mmol/L (ref 96–106)
Creatinine, Ser: 1.06 mg/dL — ABNORMAL HIGH (ref 0.57–1.00)
Globulin, Total: 2.4 g/dL (ref 1.5–4.5)
Glucose: 106 mg/dL — ABNORMAL HIGH (ref 70–99)
Potassium: 5.5 mmol/L — ABNORMAL HIGH (ref 3.5–5.2)
Sodium: 137 mmol/L (ref 134–144)
Total Protein: 6.6 g/dL (ref 6.0–8.5)
eGFR: 64 mL/min/{1.73_m2} (ref >59)

## 2023-11-05 LAB — TSH: TSH: 1.38 u[IU]/mL (ref 0.450–4.500)

## 2023-11-05 LAB — POCT GLYCOSYLATED HEMOGLOBIN (HGB A1C): Hemoglobin A1C: 6.2 % — AB (ref 4.0–5.6)

## 2023-11-05 LAB — T4, FREE: Free T4: 1.38 ng/dL (ref 0.82–1.77)

## 2023-11-05 MED ORDER — TIRZEPATIDE 12.5 MG/0.5ML ~~LOC~~ SOAJ: 12.5000 mg | SUBCUTANEOUS | 1 refills | Status: DC

## 2023-11-05 MED ORDER — LEVOTHYROXINE SODIUM 75 MCG PO TABS: 75.0000 ug | ORAL_TABLET | Freq: Every day | ORAL | 3 refills | Status: DC

## 2023-11-05 NOTE — Progress Notes (Signed)
11/05/2023, 10:00 AM Endocrinology Follow Up Visit   Subjective:    Patient ID: Catherine Howard, female    DOB: 1976/11/19.  Catherine Howard is being seen in follow up for management of currently uncontrolled symptomatic diabetes requested by  Smith Robert, MD.   Past Medical History:  Diagnosis Date   ADHD    Bipolar I disorder (HCC)    Elevated cholesterol    GERD (gastroesophageal reflux disease)    Thyroid disease    Type 2 diabetes mellitus (HCC)    Vitamin D deficiency     Past Surgical History:  Procedure Laterality Date   CESAREAN SECTION     DILATION AND CURETTAGE OF UTERUS      Social History   Socioeconomic History   Marital status: Single    Spouse name: Not on file   Number of children: Not on file   Years of education: Not on file   Highest education level: Not on file  Occupational History   Not on file  Tobacco Use   Smoking status: Every Day    Current packs/day: 2.00    Average packs/day: 2.0 packs/day for 32.0 years (64.0 ttl pk-yrs)    Types: Cigarettes   Smokeless tobacco: Never   Tobacco comments:    smokes 3/4 of a pack a day--01/22/2021  Vaping Use   Vaping status: Former  Substance and Sexual Activity   Alcohol use: No    Alcohol/week: 0.0 standard drinks of alcohol   Drug use: No   Sexual activity: Yes    Partners: Male    Birth control/protection: None  Other Topics Concern   Not on file  Social History Narrative   Not on file   Social Determinants of Health   Financial Resource Strain: Not on file  Food Insecurity: Not on file  Transportation Needs: Not on file  Physical Activity: Not on file  Stress: Not on file  Social Connections: Not on file    Family History  Problem Relation Age of Onset   Schizophrenia Father    Alcohol abuse Father    Diabetes Father    Hyperlipidemia Father    Alcohol abuse Paternal Uncle      Outpatient Encounter Medications as of 11/05/2023  Medication Sig   ACCU-CHEK AVIVA PLUS test strip SMARTSIG:Via Meter   Accu-Chek Softclix Lancets lancets SMARTSIG:Topical   amphetamine-dextroamphetamine (ADDERALL) 20 MG tablet Take 1 tablet (20 mg total) by mouth 2 (two) times daily.   amphetamine-dextroamphetamine (ADDERALL) 20 MG tablet Take 1 tablet (20 mg total) by mouth 2 (two) times daily.   amphetamine-dextroamphetamine (ADDERALL) 20 MG tablet Take 1 tablet (20 mg total) by mouth 2 (two) times daily.   Azelastine-Fluticasone 137-50 MCG/ACT SUSP Place 1 spray into the nose in the morning and at bedtime. One spray each nostril two times daily   Cariprazine HCl (VRAYLAR) 4.5 MG CAPS Take 1 capsule (4.5 mg total) by mouth daily.   carisoprodol (SOMA) 350 MG tablet Take 350 mg by mouth. One bid   DULoxetine (CYMBALTA) 60 MG capsule Take 1 capsule (60 mg total) by mouth 2 (  two) times daily.   esomeprazole (NEXIUM) 40 MG capsule Take 1 capsule (40 mg total) by mouth 2 (two) times daily. Take 30- 60 min before your first and last meals of the day   fenofibrate (TRICOR) 145 MG tablet Take 1 tablet (145 mg total) by mouth daily.   HYDROcodone-acetaminophen (NORCO) 10-325 MG tablet Take 1 tablet by mouth 2 (two) times daily as needed.   hydrOXYzine (ATARAX) 25 MG tablet Take 1 tablet (25 mg total) by mouth 3 (three) times daily as needed for anxiety.   ipratropium (ATROVENT HFA) 17 MCG/ACT inhaler Inhale 2 puffs into the lungs every 6 (six) hours as needed for wheezing.   levocetirizine (XYZAL) 5 MG tablet 1 tablet in the evening   naloxegol oxalate (MOVANTIK) 12.5 MG TABS tablet Take 1 tablet (12.5 mg total) by mouth daily.   NOVOFINE PEN NEEDLE 32G X 6 MM MISC USE AS DIRECTED   NYSTATIN powder Apply topically.   tirzepatide (MOUNJARO) 12.5 MG/0.5ML Pen Inject 12.5 mg into the skin once a week.   Vitamin D, Ergocalciferol, (DRISDOL) 1.25 MG (50000 UNIT) CAPS capsule TAKE ONE CAPSULE BY  MOUTH EVERY 7 DAYS   [DISCONTINUED] levothyroxine (SYNTHROID) 75 MCG tablet Take 1 tablet (75 mcg total) by mouth daily.   [DISCONTINUED] tirzepatide (MOUNJARO) 10 MG/0.5ML Pen Inject 10 mg into the skin once a week.   levothyroxine (SYNTHROID) 75 MCG tablet Take 1 tablet (75 mcg total) by mouth daily.   No facility-administered encounter medications on file as of 11/05/2023.    ALLERGIES: No Known Allergies  VACCINATION STATUS: Immunization History  Administered Date(s) Administered   Tdap 01/28/2017    Diabetes She presents for her follow-up diabetic visit. She has type 2 diabetes mellitus. Onset time: She was diagnosed at approximate age of 6 years. Her disease course has been stable. There are no hypoglycemic associated symptoms. Pertinent negatives for hypoglycemia include no confusion, headaches, nervousness/anxiousness, pallor or seizures. Associated symptoms include fatigue and weight loss. Pertinent negatives for diabetes include no chest pain, no foot ulcerations, no polydipsia, no polyphagia, no polyuria and no visual change. There are no hypoglycemic complications. Symptoms are stable. There are no diabetic complications. Risk factors for coronary artery disease include diabetes mellitus, dyslipidemia, obesity, sedentary lifestyle, tobacco exposure and hypertension. Current diabetic treatments: Mounjaro. She is compliant with treatment most of the time. Her weight is decreasing steadily. She is following a generally healthy diet. When asked about meal planning, she reported none. She has not had a previous visit with a dietitian. She rarely participates in exercise. (She presents today with no meter or logs to review, was not asked to routinely monitor given safe regimen with Mounjaro only.  Her POCT A1c today is 6.2%, increasing slightly from last visit of 6%.  She denies any unpleasant side effects of this medication.  She does report she started smoking once again.) An ACE  inhibitor/angiotensin II receptor blocker is not being taken. She does not see a podiatrist.Eye exam is current.  Hyperlipidemia This is a chronic problem. The current episode started more than 1 year ago. The problem is uncontrolled (yet improving). Recent lipid tests were reviewed and are high. Exacerbating diseases include diabetes, hypothyroidism and obesity. Factors aggravating her hyperlipidemia include fatty foods and smoking. Pertinent negatives include no chest pain, myalgias or shortness of breath. Current antihyperlipidemic treatment includes statins and fibric acid derivatives. The current treatment provides moderate improvement of lipids. Compliance problems include adherence to diet and adherence to exercise.  Risk  factors for coronary artery disease include diabetes mellitus, dyslipidemia, a sedentary lifestyle, obesity and hypertension.    Review of systems  Constitutional: + steadily decreasing body weight,  current Body mass index is 33.83 kg/m. , + fatigue, + subjective hyperthermia, no subjective hypothermia Eyes: no blurry vision, no xerophthalmia ENT: no sore throat, no nodules palpated in throat, no dysphagia/odynophagia, no hoarseness Cardiovascular: no chest pain, no shortness of breath, no palpitations, no leg swelling Respiratory: no cough, no shortness of breath Gastrointestinal: no nausea/vomiting/diarrhea Musculoskeletal: no muscle/joint aches Skin: no rashes, no hyperemia Neurological: no tremors, no numbness, no tingling, no dizziness Psychiatric: no depression, no anxiety, hx bipolar- currently controlled on meds  Objective:     BP 106/72 (BP Location: Right Arm, Patient Position: Sitting, Cuff Size: Large)   Pulse (!) 102   Ht 5' 3.5" (1.613 m)   Wt 194 lb (88 kg)   BMI 33.83 kg/m   Wt Readings from Last 3 Encounters:  11/05/23 194 lb (88 kg)  08/14/23 193 lb 8 oz (87.8 kg)  02/12/23 205 lb (93 kg)    BP Readings from Last 3 Encounters:  11/05/23  106/72  08/14/23 102/72  02/12/23 101/70      Physical Exam- Limited  Constitutional:  Body mass index is 33.83 kg/m. , not in acute distress, normal state of mind Eyes:  EOMI, no exophthalmos Musculoskeletal: no gross deformities, strength intact in all four extremities, no gross restriction of joint movements Skin:  no rashes, no hyperemia Neurological: no tremor with outstretched hands   Diabetic Foot Exam - Simple   Simple Foot Form Diabetic Foot exam was performed with the following findings: Yes 11/05/2023  9:37 AM  Visual Inspection No deformities, no ulcerations, no other skin breakdown bilaterally: Yes Sensation Testing Intact to touch and monofilament testing bilaterally: Yes Pulse Check Posterior Tibialis and Dorsalis pulse intact bilaterally: Yes Comments    CMP ( most recent) CMP     Component Value Date/Time   NA 137 11/04/2023 0954   K 5.5 (H) 11/04/2023 0954   CL 101 11/04/2023 0954   CO2 22 11/04/2023 0954   GLUCOSE 106 (H) 11/04/2023 0954   GLUCOSE 89 07/14/2014 0958   BUN 7 11/04/2023 0954   CREATININE 1.06 (H) 11/04/2023 0954   CREATININE 0.75 07/14/2014 0958   CALCIUM 9.6 11/04/2023 0954   PROT 6.6 11/04/2023 0954   ALBUMIN 4.2 11/04/2023 0954   AST 18 11/04/2023 0954   ALT 22 11/04/2023 0954   ALKPHOS 128 (H) 11/04/2023 0954   BILITOT <0.2 11/04/2023 0954   GFRNONAA 80 10/10/2020 0000     Diabetic Labs (most recent): Lab Results  Component Value Date   HGBA1C 6.2 (A) 11/05/2023   HGBA1C 6.0 02/06/2023   HGBA1C 5.9 07/08/2022   MICROALBUR <0.2 02/06/2023   MICROALBUR 10 07/08/2022   MICROALBUR 10 01/24/2021     Lipid Panel     Component Value Date/Time   CHOL 137 02/06/2023 0000   CHOL 221 (H) 10/17/2021 1008   TRIG 95 02/06/2023 0000   HDL 55 02/06/2023 0000   HDL 39 (L) 10/17/2021 1008   CHOLHDL 5.7 (H) 10/17/2021 1008   LDLCALC 64 02/06/2023 0000   LDLCALC 124 (H) 10/17/2021 1008   LABVLDL 58 (H) 10/17/2021 1008      Latest Reference Range & Units 10/17/21 00:00 10/17/21 10:08 02/27/22 08:39 02/06/23 00:00 06/09/23 11:04 11/04/23 09:54  TSH 0.450 - 4.500 uIU/mL 0.58 (E) 0.575 1.670 1.82 1.400 1.380  T4,Free(Direct) 0.82 -  1.77 ng/dL  1.61 0.96  0.45 4.09  (E): External lab result  Assessment & Plan:   1) Type 2 diabetes mellitus without complication, without long-term current use of insulin (HCC)  - Catherine Howard has currently uncontrolled symptomatic type 2 DM since  47 years of age.  She presents today with no meter or logs to review, was not asked to routinely monitor given safe regimen with Mounjaro only.  Her POCT A1c today is 6.2%, increasing slightly from last visit of 6%.  She denies any unpleasant side effects of this medication.  She does report she started smoking once again.  - I had a long discussion with her about the progressive nature of diabetes and the pathology behind its complications. -her diabetes is complicated by obesity/sedentary life, chronic  Smoking, sleep apnea and she remains at a high risk for more acute and chronic complications which include CAD, CVA, CKD, retinopathy, and neuropathy. These are all discussed in detail with her.  - Nutritional counseling repeated at each appointment due to patients tendency to fall back in to old habits.  - The patient admits there is a room for improvement in their diet and drink choices. -  Suggestion is made for the patient to avoid simple carbohydrates from their diet including Cakes, Sweet Desserts / Pastries, Ice Cream, Soda (diet and regular), Sweet Tea, Candies, Chips, Cookies, Sweet Pastries, Store Bought Juices, Alcohol in Excess of 1-2 drinks a day, Artificial Sweeteners, Coffee Creamer, and "Sugar-free" Products. This will help patient to have stable blood glucose profile and potentially avoid unintended weight gain.   - I encouraged the patient to switch to unprocessed or minimally processed complex starch and increased  protein intake (animal or plant source), fruits, and vegetables.   - Patient is advised to stick to a routine mealtimes to eat 3 meals a day and avoid unnecessary snacks (to snack only to correct hypoglycemia).  - I have approached her with the following individualized plan to manage  her diabetes and patient agrees:   -She is advised to increase her Mounjaro to 12.5 mg SQ weekly.  I discussed the potential dangers of taking GLP1 product while smoking as it increases risk of pancreatitis.  I urged her to quit once again.  -She can take a break from routine glucose monitoring due to monotherapy with Mounjaro only.  - Specific targets for  A1c;  LDL, HDL,  and Triglycerides were discussed with the patient.  2) Blood Pressure /Hypertension: Her blood pressure is controlled to target.  She is not currently on any antihypertensive medications at this time.  If BP is elevated on 3 separate occasions, she will be considered for low dose ACE/ARB for renal protection from DM.  3) Lipids/Hyperlipidemia:  Her most recent lipid panel from 02/06/23 shows uncontrolled LDL of 64 (greatly improved).  She is advised to continue Crestor 20 mg po daily at bedtime and Fenofibrate 145 mg po daily.  Side effects and precautions discussed with her.  She is also advised to avoid fried foods and butter.    4)  Weight/Diet:  Her Body mass index is 33.83 kg/m.-   clearly complicating her diabetes care.   she is  a candidate for weight loss. I discussed with her the fact that loss of 5 - 10% of her  current body weight will have the most impact on her diabetes management.  Exercise, and detailed carbohydrates information provided  -  detailed on discharge instructions.    5) Hypothyroidism-  unspecified Her previsit TFTs are consistent with appropriate hormone replacement.  She is advised to continue her Levothyroxine 75 mcg po daily before breakfast.    - The correct intake of thyroid hormone (Levothyroxine, Synthroid),  is on empty stomach first thing in the morning, with water, separated by at least 30 minutes from breakfast and other medications,  and separated by more than 4 hours from calcium, iron, multivitamins, acid reflux medications (PPIs).  - This medication is a life-long medication and will be needed to correct thyroid hormone imbalances for the rest of your life.  The dose may change from time to time, based on thyroid blood work.  - It is extremely important to be consistent taking this medication, near the same time each morning.  -AVOID TAKING PRODUCTS CONTAINING BIOTIN (commonly found in Hair, Skin, Nails vitamins) AS IT INTERFERES WITH THE VALIDITY OF THYROID FUNCTION BLOOD TESTS.  6) Vitamin D Deficiency Her most recent vitamin D level was 30 on 13/8/23.  She is currently taking Ergocalciferol 50000 units weekly, she is advised to continue.    7) Chronic Care/Health Maintenance: -she is on Statin medications and  is encouraged to initiate and continue to follow up with Ophthalmology, Dentist, Podiatrist at least yearly or according to recommendations, and advised to  Quit smoking. I have recommended yearly flu vaccine and pneumonia vaccine at least every 5 years; moderate intensity exercise for up to 150 minutes weekly; and  sleep for at least 7 hours a day.  Smoking cessation instruction/counseling given:  counseled patient on the dangers of tobacco use, advised patient to stop smoking, and reviewed strategies to maximize success   - she is advised to maintain close follow up with Smith Robert, MD for primary care needs, as well as her other providers for optimal and coordinated care.  I did enter referral to podiatry for DM foot care.     I spent  32  minutes in the care of the patient today including review of labs from CMP, Lipids, Thyroid Function, Hematology (current and previous including abstractions from other facilities); face-to-face time discussing  her blood glucose  readings/logs, discussing hypoglycemia and hyperglycemia episodes and symptoms, medications doses, her options of short and long term treatment based on the latest standards of care / guidelines;  discussion about incorporating lifestyle medicine;  and documenting the encounter. Risk reduction counseling performed per USPSTF guidelines to reduce obesity and cardiovascular risk factors.     Please refer to Patient Instructions for Blood Glucose Monitoring and Insulin/Medications Dosing Guide"  in media tab for additional information. Please  also refer to " Patient Self Inventory" in the Media  tab for reviewed elements of pertinent patient history.  [name] participated in the discussions, expressed understanding, and voiced agreement with the above plans.  All questions were answered to her satisfaction. she is encouraged to contact clinic should she have any questions or concerns prior to her return visit.    Follow up plan: - Return in about 4 months (around 03/04/2024) for Diabetes F/U with A1c in office, Thyroid follow up, No previsit labs.  Ronny Bacon, [facility] [address] [place], [place] [address] Phone: [phone] Fax: [phone]  11/05/2023, 10:00 AM

## 2023-11-17 ENCOUNTER — Other Ambulatory Visit (HOSPITAL_COMMUNITY): Payer: Self-pay

## 2023-11-17 DIAGNOSIS — M545 Low back pain, unspecified: Secondary | ICD-10-CM

## 2023-11-17 DIAGNOSIS — M542 Cervicalgia: Secondary | ICD-10-CM

## 2023-11-18 ENCOUNTER — Encounter: Payer: Self-pay | Admitting: Adult Health

## 2023-11-18 ENCOUNTER — Encounter (INDEPENDENT_AMBULATORY_CARE_PROVIDER_SITE_OTHER): Payer: Self-pay | Admitting: Gastroenterology

## 2023-11-18 ENCOUNTER — Ambulatory Visit: Payer: MEDICAID | Admitting: Adult Health

## 2023-12-02 ENCOUNTER — Other Ambulatory Visit: Payer: Self-pay | Admitting: Nurse Practitioner

## 2023-12-02 DIAGNOSIS — Z7985 Long-term (current) use of injectable non-insulin antidiabetic drugs: Secondary | ICD-10-CM

## 2023-12-02 DIAGNOSIS — E119 Type 2 diabetes mellitus without complications: Secondary | ICD-10-CM

## 2023-12-03 ENCOUNTER — Other Ambulatory Visit: Payer: Self-pay | Admitting: Nurse Practitioner

## 2023-12-19 ENCOUNTER — Other Ambulatory Visit (HOSPITAL_COMMUNITY): Payer: Self-pay

## 2023-12-19 ENCOUNTER — Telehealth: Payer: Self-pay

## 2023-12-19 NOTE — Telephone Encounter (Signed)
*  Endo  Pharmacy Patient Advocate Encounter   Received notification from CoverMyMeds that prior authorization for [facility] 12.5MG /0.5ML auto-injectors  is required/requested.   Insurance verification completed.   The patient is insured through [employer] .   Per test claim: PA required; PA submitted to above mentioned insurance via CoverMyMeds Key/confirmation #/EOC [facility] Status is pending

## 2023-12-22 ENCOUNTER — Ambulatory Visit (INDEPENDENT_AMBULATORY_CARE_PROVIDER_SITE_OTHER): Payer: MEDICAID | Admitting: Gastroenterology

## 2023-12-23 ENCOUNTER — Telehealth (HOSPITAL_COMMUNITY): Payer: MEDICAID | Admitting: Psychiatry

## 2023-12-23 ENCOUNTER — Encounter (HOSPITAL_COMMUNITY): Payer: Self-pay | Admitting: Psychiatry

## 2023-12-23 DIAGNOSIS — F902 Attention-deficit hyperactivity disorder, combined type: Secondary | ICD-10-CM | POA: Diagnosis not present

## 2023-12-23 DIAGNOSIS — F3162 Bipolar disorder, current episode mixed, moderate: Secondary | ICD-10-CM | POA: Diagnosis not present

## 2023-12-23 MED ORDER — AMPHETAMINE-DEXTROAMPHETAMINE 20 MG PO TABS: 20.0000 mg | ORAL_TABLET | Freq: Two times a day (BID) | ORAL | 0 refills | Status: DC

## 2023-12-23 MED ORDER — DULOXETINE HCL 60 MG PO CPEP: 60.0000 mg | ORAL_CAPSULE | Freq: Two times a day (BID) | ORAL | 2 refills | Status: DC

## 2023-12-23 MED ORDER — VRAYLAR 4.5 MG PO CAPS: 1.0000 | ORAL_CAPSULE | Freq: Every day | ORAL | 2 refills | Status: DC

## 2023-12-23 MED ORDER — HYDROXYZINE HCL 25 MG PO TABS: 25.0000 mg | ORAL_TABLET | Freq: Three times a day (TID) | ORAL | 2 refills | Status: DC | PRN

## 2023-12-23 NOTE — Progress Notes (Signed)
 Virtual Visit via Video Note  I connected with Catherine Howard on 12/23/23 at  3:00 PM EST by a video enabled telemedicine application and verified that I am speaking with the correct person using two identifiers.  Location: Patient: home Provider: office   I discussed the limitations of evaluation and management by telemedicine and the availability of in person appointments. The patient expressed understanding and agreed to proceed.      I discussed the assessment and treatment plan with the patient. The patient was provided an opportunity to ask questions and all were answered. The patient agreed with the plan and demonstrated an understanding of the instructions.   The patient was advised to call back or seek an in-person evaluation if the symptoms worsen or if the condition fails to improve as anticipated.  I provided 20 minutes of non-face-to-face time during this encounter.   Barnie Gull, MD  [facility] MD/PA/NP OP Progress Note  12/23/2023 3:12 PM Catherine Howard  MRN:  [id number]  Chief Complaint:  Chief Complaint  Patient presents with   Depression   Anxiety   Follow-up   ADHD   Manic Behavior   HPI: This patient is a 47 year old divorced white female who lives with her fianc and son in [place]. She had a 49 year old son died of a drug overdose in 12/09/13. The patient does not work and is on disability.  The patient returns for follow-up after 2 months regarding her bipolar disorder anxiety and ADHD.  Overall she states that she is doing well.  She denies significant depression anxiety mood swings or irritability.  She states however that she is not sleeping well.  On further questioning however it turns out that she is taking her second dose of Adderall 20 mg at bedtime.  I am not sure how she decided to do this but explained that the second dose should not be taken any later than 1 or 2 PM.  I am sure this has a lot to do with her poor sleep.  She is going to change this and we  will see how it goes. Visit Diagnosis:    ICD-10-CM   1. Bipolar 1 disorder, mixed, moderate (HCC)  F31.62     2. Attention deficit hyperactivity disorder (ADHD), combined type  F90.2       Past Psychiatric History: Outpatient treatment for the last several years  Past Medical History:  Past Medical History:  Diagnosis Date   ADHD    Bipolar I disorder (HCC)    Elevated cholesterol    GERD (gastroesophageal reflux disease)    Thyroid disease    Type 2 diabetes mellitus (HCC)    Vitamin D  deficiency     Past Surgical History:  Procedure Laterality Date   CESAREAN SECTION     DILATION AND CURETTAGE OF UTERUS      Family Psychiatric History: See below  Family History:  Family History  Problem Relation Age of Onset   Schizophrenia Father    Alcohol abuse Father    Diabetes Father    Hyperlipidemia Father    Alcohol abuse Paternal Uncle     Social History:  Social History   Socioeconomic History   Marital status: Single    Spouse name: Not on file   Number of children: Not on file   Years of education: Not on file   Highest education level: Not on file  Occupational History   Not on file  Tobacco Use   Smoking status: Every Day  Current packs/day: 2.00    Average packs/day: 2.0 packs/day for 32.0 years (64.0 ttl pk-yrs)    Types: Cigarettes   Smokeless tobacco: Never   Tobacco comments:    smokes 3/4 of a pack a day--01/22/2021  Vaping Use   Vaping status: Former  Substance and Sexual Activity   Alcohol use: No    Alcohol/week: 0.0 standard drinks of alcohol   Drug use: No   Sexual activity: Yes    Partners: Male    Birth control/protection: None  Other Topics Concern   Not on file  Social History Narrative   Not on file   Social Drivers of Health   Financial Resource Strain: Not on file  Food Insecurity: Not on file  Transportation Needs: Not on file  Physical Activity: Not on file  Stress: Not on file  Social Connections: Not on file     Allergies: No Known Allergies  Metabolic Disorder Labs: Lab Results  Component Value Date   HGBA1C 6.2 (A) 11/05/2023   No results found for: PROLACTIN Lab Results  Component Value Date   CHOL 137 02/06/2023   TRIG 95 02/06/2023   HDL 55 02/06/2023   CHOLHDL 5.7 (H) 10/17/2021   LDLCALC 64 02/06/2023   LDLCALC 124 (H) 10/17/2021   Lab Results  Component Value Date   TSH 1.380 11/04/2023   TSH 1.400 06/09/2023    Therapeutic Level Labs: Lab Results  Component Value Date   LITHIUM  0.40 (L) 07/14/2014   No results found for: VALPROATE No results found for: CBMZ  Current Medications: Current Outpatient Medications  Medication Sig Dispense Refill   ACCU-CHEK AVIVA PLUS test strip SMARTSIG:Via Meter     Accu-Chek Softclix Lancets lancets SMARTSIG:Topical     amphetamine -dextroamphetamine  (ADDERALL) 20 MG tablet Take 1 tablet (20 mg total) by mouth 2 (two) times daily. 60 tablet 0   amphetamine -dextroamphetamine  (ADDERALL) 20 MG tablet Take 1 tablet (20 mg total) by mouth 2 (two) times daily. 60 tablet 0   amphetamine -dextroamphetamine  (ADDERALL) 20 MG tablet Take 1 tablet (20 mg total) by mouth 2 (two) times daily. 60 tablet 0   Azelastine-Fluticasone 137-50 MCG/ACT SUSP Place 1 spray into the nose in the morning and at bedtime. One spray each nostril two times daily     Cariprazine  HCl (VRAYLAR ) 4.5 MG CAPS Take 1 capsule (4.5 mg total) by mouth daily. 30 capsule 2   carisoprodol (SOMA) 350 MG tablet Take 350 mg by mouth. One bid     DULoxetine  (CYMBALTA ) 60 MG capsule Take 1 capsule (60 mg total) by mouth 2 (two) times daily. 180 capsule 2   esomeprazole  (NEXIUM ) 40 MG capsule Take 1 capsule (40 mg total) by mouth 2 (two) times daily. Take 30- 60 min before your first and last meals of the day 180 capsule 3   fenofibrate  (TRICOR ) 145 MG tablet Take 1 tablet (145 mg total) by mouth daily. 90 tablet 3   HYDROcodone-acetaminophen (NORCO) 10-325 MG tablet Take 1  tablet by mouth 2 (two) times daily as needed.     hydrOXYzine  (ATARAX ) 25 MG tablet Take 1 tablet (25 mg total) by mouth 3 (three) times daily as needed for anxiety. 90 tablet 2   ipratropium (ATROVENT  HFA) 17 MCG/ACT inhaler Inhale 2 puffs into the lungs every 6 (six) hours as needed for wheezing. 1 each 11   levocetirizine (XYZAL) 5 MG tablet 1 tablet in the evening     levothyroxine  (SYNTHROID ) 75 MCG tablet Take 1 tablet (75 mcg  total) by mouth daily. 90 tablet 3   naloxegol  oxalate (MOVANTIK ) 12.5 MG TABS tablet Take 1 tablet (12.5 mg total) by mouth daily. 30 tablet 1   NOVOFINE PEN NEEDLE 32G X 6 MM MISC USE AS DIRECTED 100 each 3   NYSTATIN powder Apply topically.     tirzepatide  (MOUNJARO ) 12.5 MG/0.5ML Pen Inject 12.5 mg into the skin once a week. 6 mL 1   tirzepatide  (MOUNJARO ) 12.5 MG/0.5ML Pen Inject 12.5 mg into the skin once a week. 6 mL 3   Vitamin D , Ergocalciferol , (DRISDOL ) 1.25 MG (50000 UNIT) CAPS capsule TAKE ONE CAPSULE BY MOUTH EVERY 7 DAYS 12 capsule 0   No current facility-administered medications for this visit.     Musculoskeletal: Strength & Muscle Tone: within normal limits Gait & Station: normal Patient leans: N/A  Psychiatric Specialty Exam: Review of Systems  Musculoskeletal:  Positive for back pain.  Psychiatric/Behavioral:  Positive for sleep disturbance.   All other systems reviewed and are negative.   There were no vitals taken for this visit.There is no height or weight on file to calculate BMI.  General Appearance: Casual and Fairly Groomed  Eye Contact:  Good  Speech:  Clear and Coherent  Volume:  Normal  Mood:  Euthymic  Affect:  Congruent  Thought Process:  Goal Directed  Orientation:  Full (Time, Place, and Person)  Thought Content: WDL   Suicidal Thoughts:  No  Homicidal Thoughts:  No  Memory:  Immediate;   Good Recent;   Good Remote;   NA  Judgement:  Good  Insight:  Fair  Psychomotor Activity:  Normal  Concentration:   Concentration: Good and Attention Span: Good  Recall:  Good  Fund of Knowledge: Good  Language: Good  Akathisia:  No  Handed:  Right  AIMS (if indicated): not done  Assets:  Communication Skills Desire for Improvement Physical Health Resilience Social Support  ADL's:  Intact  Cognition: WNL  Sleep:  Good   Screenings: GAD-7    Flowsheet Row Office Visit from 03/13/2023 in [facility] [place]  Total GAD-7 Score 14      PHQ2-9    Flowsheet Row Office Visit from 03/13/2023 in [facility] [place] Video Visit from 09/26/2022 in [facility] at [place] Video Visit from 08/29/2022 in [facility] at [place] Video Visit from 07/04/2022 in [facility] at [place] Video Visit from 06/05/2022 in [facility] Total Score 4 0 1 1 1   PHQ-9 Total Score 12 -- -- -- --      Flowsheet Row Office Visit from 03/13/2023 in [facility] at [place] Video Visit from 09/26/2022 in [facility] at [place] Video Visit from 08/29/2022 in [facility] at [place]  C-SSRS RISK CATEGORY No Risk No Risk No Risk        Assessment and Plan: This patient is a 47 year old female with a history of bipolar disorder anxiety and ADD.  She states she is not sleeping well but this may be because she is taking the Adderall at bedtime.  She is supposed to take Adderall 20 mg in the morning and 20 mg at noon.  She will continue hydroxyzine  25 mg 3 times daily as needed for anxiety, Vraylar  4.5 mg daily for mood stabilization and Cymbalta  60 mg twice daily for depression.  She will  return to see me in 3 months  Collaboration of Care: Collaboration of Care: Primary Care Provider AEB notes to be shared with PCP at patient's  request  Patient/Guardian was advised Release of Information must be obtained prior to any record release in order to collaborate their care with an outside provider. Patient/Guardian was advised if they have not already done so to contact the registration department to sign all necessary forms in order for us  to release information regarding their care.   Consent: Patient/Guardian gives verbal consent for treatment and assignment of benefits for services provided during this visit. Patient/Guardian expressed understanding and agreed to proceed.    Barnie Gull, MD 12/23/2023, 3:12 PM

## 2023-12-29 NOTE — Telephone Encounter (Signed)
 Pharmacy Patient Advocate Encounter  Received notification from [facility] that Prior Authorization for Catherine Howard has been DENIED.  Full denial letter will be uploaded to the media tab. See denial reason below.

## 2023-12-30 NOTE — Telephone Encounter (Signed)
 Noted.

## 2023-12-30 NOTE — Telephone Encounter (Signed)
 Patient needs to try the preferred but these may need a PA as well. They are Byetta , Trulicity, Ozempic and Victoza. The patient has tried the Victoza (02/28/2022). Your thoughts, Whitney?

## 2023-12-30 NOTE — Telephone Encounter (Signed)
 Sounds like we will have to change her to Ozempic given the insurance denial, despite this being a continuation of therapy.  Can you reach out to the patient and see if she would like me to send in a prescription?

## 2023-12-30 NOTE — Telephone Encounter (Signed)
 I haven't seen the request yet. Will send them what needed once we receive paperwork.

## 2023-12-30 NOTE — Telephone Encounter (Signed)
 I talked with Rosaline. She states that she called her insurance and is appealing this. She was told they would send us  something to be completed, and they are asking for you notes that support how well she has done on the Monujaro to help support this appeal. I will send this to Luke also , as she may have seen the request they were going to send.

## 2023-12-31 NOTE — Telephone Encounter (Signed)
 Sent requested records to [employer]. By the information sent, it looks like they want to deny Mounjaro due to her A1c going up from 6.0 to 6.2.

## 2024-01-01 NOTE — Telephone Encounter (Signed)
 FYI Appeal was denied. Insurance basically does not like that her A1c went from 6.0 to 6.2 while taking this medication.

## 2024-01-01 NOTE — Telephone Encounter (Signed)
 Received fax- denied. Kim, its in your box

## 2024-01-01 NOTE — Telephone Encounter (Signed)
 FYI The requested documentation has been sent twice. Once on 12/31/23 with a confirmation and on 01/01/24 with a confirmation. [employer] is leaving multiple VM stating they need this information. Called the # they left on VM [phone]. Got VM x 2. Lft msg for them to call us  back.

## 2024-01-02 MED ORDER — SEMAGLUTIDE (1 MG/DOSE) 4 MG/3ML ~~LOC~~ SOPN: 1.0000 mg | PEN_INJECTOR | SUBCUTANEOUS | 0 refills | Status: DC

## 2024-01-02 NOTE — Telephone Encounter (Signed)
 Patient was called and a voice message was left letting her know that a prescription had been sent in for her Ozempic.

## 2024-01-02 NOTE — Addendum Note (Signed)
 Addended by: Dani Gobble on: 01/02/2024 10:24 AM   Modules accepted: Orders

## 2024-01-02 NOTE — Telephone Encounter (Signed)
 I have sent in for Ozempic 1 to [facility] for her.  If she tolerates the change well, we may increase once she needs a refill.

## 2024-01-02 NOTE — Telephone Encounter (Signed)
 Talked with the patient. She says that the insurance company wanted you to send them notes that she had been on Victoza  in the past, and also notes to support how long she had been on the Mounjaro . I told her that she would need to tr\y one of the other preferred, as normally a patient has to fail at least two of the preferred medications.  She would like for you to sent in a prescription to [facility] for the Ozempic .

## 2024-01-02 NOTE — Telephone Encounter (Signed)
 Whitney see the note below.

## 2024-01-05 ENCOUNTER — Other Ambulatory Visit (HOSPITAL_COMMUNITY): Payer: Self-pay

## 2024-01-05 ENCOUNTER — Telehealth: Payer: Self-pay | Admitting: *Deleted

## 2024-01-05 ENCOUNTER — Telehealth: Payer: Self-pay

## 2024-01-05 NOTE — Telephone Encounter (Signed)
 Pharmacy Patient Advocate Encounter   Received notification from [employer] that prior authorization for ozempic  is required/requested.   Insurance verification completed.   The patient is insured through [employer] .   Per test claim: PA required; PA submitted to above mentioned insurance via CoverMyMeds Key/confirmation #/EOC [id number] Status is pending

## 2024-01-05 NOTE — Telephone Encounter (Signed)
 Patient left a message that the new prescription for Ozempic  will need a PA also.This is one of the preferred per her insurance. It was noted that even with her preferred medications a PA would possibly be needed.Patient uses [facility] in [place]. Patient was called and made aware that this would be sent over to the PA team. We will let her know something as we hear back from them.

## 2024-01-12 ENCOUNTER — Other Ambulatory Visit: Payer: Self-pay | Admitting: Nurse Practitioner

## 2024-01-12 ENCOUNTER — Telehealth: Payer: Self-pay | Admitting: *Deleted

## 2024-01-12 MED ORDER — TIRZEPATIDE 10 MG/0.5ML ~~LOC~~ SOAJ: 10.0000 mg | SUBCUTANEOUS | 1 refills | Status: DC

## 2024-01-12 NOTE — Telephone Encounter (Signed)
Patient was called and made aware. 

## 2024-01-12 NOTE — Telephone Encounter (Signed)
I will try sending in for [facility] (will to the 10 mg to make sure she doesn't have any unpleasant side effects from the switch).  I have added Ozempic to the allergy list.  Maybe this will be enough to get her insurance to approve it.

## 2024-01-12 NOTE — Telephone Encounter (Signed)
Patient left a voicemail that she was approved for her Ozempic. She has started it but that it is making her itch. Patient  was called back, states that she has only taken 1 injection, this was on Saturday,01/10/24.Marland KitchenShe noticed the itching within 5 minutes of receiving the injection.She experienced itching at the injection site and all over body.The itching lasted a couple of hours. Patient states that she does not like the Ozempic and would like to be back on the [facility].

## 2024-01-13 ENCOUNTER — Ambulatory Visit (INDEPENDENT_AMBULATORY_CARE_PROVIDER_SITE_OTHER): Payer: MEDICAID | Admitting: Gastroenterology

## 2024-01-13 NOTE — Telephone Encounter (Signed)
Patient received this on Friday of last week , and she injected. Within 10 minutes she experienced itching at the injection site and then all over body. We would like to submit this to the insurance to see if we may be able to get her back on her Mounjaro. As this will be two of the preferred that she has tried and there has been issues with them both.

## 2024-01-13 NOTE — Telephone Encounter (Signed)
Pharmacy Patient Advocate Encounter  Received notification from [facility] that Prior Authorization for Ozempic has been APPROVED through 01/04/2025

## 2024-01-14 ENCOUNTER — Telehealth: Payer: Self-pay

## 2024-01-14 NOTE — Telephone Encounter (Signed)
Patient was called and a message left, letting her know that PA team had submitted to insurance about the allergy she had with the Ozempic. We will let her know the outcome when we hear back frem the insurance.

## 2024-01-14 NOTE — Telephone Encounter (Signed)
Pharmacy Patient Advocate Encounter   Received notification from [facility] [employer] that prior authorization for Greggory Keen is required/requested.   Insurance verification completed.   The patient is insured through [employer] .   Per test claim: PA required; PA submitted to above mentioned insurance via CoverMyMeds Key/confirmation #/EOC [id number] Status is pending

## 2024-01-29 NOTE — Telephone Encounter (Signed)
 Pharmacy Patient Advocate Encounter  Received notification from [facility]  that Prior Authorization for Mounjaro  10mg  has been DENIED.  Full denial letter will be uploaded to the media tab. See denial reason below.   Called for clarification. They are going to overturn the denial. It may take up to 24 hrs to get the approval processed.   PA #/Case ID/Reference #: PA Case ID #: [id number]

## 2024-02-03 NOTE — Telephone Encounter (Signed)
Patient was called and made aware.

## 2024-02-03 NOTE — Telephone Encounter (Signed)
Pharmacy Patient Advocate Encounter  Received notification from [facility] that Prior Authorization for Catherine Howard has been APPROVED through 01/12/2025

## 2024-02-04 ENCOUNTER — Ambulatory Visit: Payer: MEDICAID | Admitting: Orthopaedic Surgery

## 2024-02-09 ENCOUNTER — Telehealth: Payer: Self-pay | Admitting: Adult Health

## 2024-02-09 NOTE — Telephone Encounter (Signed)
Patient advised that in October, 2024 patient was advised that an appt was needed with new sleep study. Patient reports she has contacted her insurance and was advised that appt and sleep study are not needed. Patient did call [employer] today also and is waiting on a call back from them.  Patient is scheduled for follow up in April, 2025.   I have messaged Brad New. Waiting on response.

## 2024-02-09 NOTE — Telephone Encounter (Signed)
Patient states she was supposed to get a new CPAP machine in November of 2024, but all she has been getting is the run around from [employer] [facility]---can we assist---patient call back [phone]

## 2024-02-10 ENCOUNTER — Telehealth: Payer: Self-pay

## 2024-02-10 NOTE — Telephone Encounter (Signed)
Patient has not been seen for sleep since 01/22/2021 and will need to have office visit and order of new sleep study before she will be able to get replacement cpap

## 2024-02-10 NOTE — Telephone Encounter (Signed)
Patient advised. Patient scheduled to for virtual appt for 02/20/24.   After our review we will need the following to process her request for a new pap unit.   1. OV face 2 face visit note showing that the patient used the pap unit and benefited from the pap unit before it broke and that the unit is not working ( we can not use phone notes).   2. Updated order with numerical settings ( on or after the OV)   Once these are recieved we should have what is needed to process for a new pap unit.   Thank you ,   Luellen Pucker

## 2024-02-10 NOTE — Telephone Encounter (Signed)
Please schedule OV with Sleep provider. Thank you!

## 2024-02-11 ENCOUNTER — Encounter (HOSPITAL_COMMUNITY): Payer: Self-pay | Admitting: Psychiatry

## 2024-02-11 ENCOUNTER — Ambulatory Visit: Payer: MEDICAID | Admitting: Orthopaedic Surgery

## 2024-02-11 ENCOUNTER — Telehealth (INDEPENDENT_AMBULATORY_CARE_PROVIDER_SITE_OTHER): Payer: MEDICAID | Admitting: Psychiatry

## 2024-02-11 DIAGNOSIS — F902 Attention-deficit hyperactivity disorder, combined type: Secondary | ICD-10-CM | POA: Diagnosis not present

## 2024-02-11 DIAGNOSIS — F3162 Bipolar disorder, current episode mixed, moderate: Secondary | ICD-10-CM | POA: Diagnosis not present

## 2024-02-11 MED ORDER — DULOXETINE HCL 60 MG PO CPEP: 60.0000 mg | ORAL_CAPSULE | Freq: Two times a day (BID) | ORAL | 2 refills | Status: DC

## 2024-02-11 MED ORDER — AMPHETAMINE-DEXTROAMPHETAMINE 20 MG PO TABS: 20.0000 mg | ORAL_TABLET | Freq: Two times a day (BID) | ORAL | 0 refills | Status: DC

## 2024-02-11 MED ORDER — VRAYLAR 4.5 MG PO CAPS: 1.0000 | ORAL_CAPSULE | Freq: Every day | ORAL | 2 refills | Status: DC

## 2024-02-11 MED ORDER — ALPRAZOLAM 1 MG PO TABS: 1.0000 mg | ORAL_TABLET | Freq: Three times a day (TID) | ORAL | 2 refills | Status: DC

## 2024-02-11 NOTE — Progress Notes (Signed)
Virtual Visit via Video Note  I connected with Catherine Howard on 02/11/24 at 11:00 AM EST by a video enabled telemedicine application and verified that I am speaking with the correct person using two identifiers.  Location: Patient: home Provider: office   I discussed the limitations of evaluation and management by telemedicine and the availability of in person appointments. The patient expressed understanding and agreed to proceed.     I discussed the assessment and treatment plan with the patient. The patient was provided an opportunity to ask questions and all were answered. The patient agreed with the plan and demonstrated an understanding of the instructions.   The patient was advised to call back or seek an in-person evaluation if the symptoms worsen or if the condition fails to improve as anticipated.  I provided 20 minutes of non-face-to-face time during this encounter.   Catherine Ruder, MD  [facility] MD/PA/NP OP Progress Note  02/11/2024 11:16 AM Catherine Howard  MRN:  [id number]  Chief Complaint:  Chief Complaint  Patient presents with   ADHD   Anxiety   Depression   Manic Behavior   HPI:  This patient is a 48 year old divorced white female who lives with her fianc and son in [place]. She had a 12 year old son died of a drug overdose in 02/23/2013. The patient does not work and is on disability.   The patient returns for follow-up after 2 months regarding her bipolar disorder anxiety and ADHD.  She states that she has been more anxious and irritable lately.  She worries a lot about her own health and about her children.  We had stopped the Xanax because she was on pain medication for her back but she no longer takes the pain medication because it "made me itch."  She very much would like to go back to the Xanax because the hydroxyzine is not helping her anxiety and sleep.  She is also going to be getting a new CPAP machine.  The patient denies significant depression or thoughts of  self-harm.  She is focusing well with the Adderall. Visit Diagnosis:    ICD-10-CM   1. Bipolar 1 disorder, mixed, moderate (HCC)  F31.62     2. Attention deficit hyperactivity disorder (ADHD), combined type  F90.2       Past Psychiatric History: Previous outpatient treatment  Past Medical History:  Past Medical History:  Diagnosis Date   ADHD    Bipolar I disorder (HCC)    Elevated cholesterol    GERD (gastroesophageal reflux disease)    Thyroid disease    Type 2 diabetes mellitus (HCC)    Vitamin D deficiency     Past Surgical History:  Procedure Laterality Date   CESAREAN SECTION     DILATION AND CURETTAGE OF UTERUS      Family Psychiatric History: See below  Family History:  Family History  Problem Relation Age of Onset   Schizophrenia Father    Alcohol abuse Father    Diabetes Father    Hyperlipidemia Father    Alcohol abuse Paternal Uncle     Social History:  Social History   Socioeconomic History   Marital status: Single    Spouse name: Not on file   Number of children: Not on file   Years of education: Not on file   Highest education level: Not on file  Occupational History   Not on file  Tobacco Use   Smoking status: Every Day    Current packs/day: 2.00  Average packs/day: 2.0 packs/day for 32.0 years (64.0 ttl pk-yrs)    Types: Cigarettes   Smokeless tobacco: Never   Tobacco comments:    smokes 3/4 of a pack a day--01/22/2021  Vaping Use   Vaping status: Former  Substance and Sexual Activity   Alcohol use: No    Alcohol/week: 0.0 standard drinks of alcohol   Drug use: No   Sexual activity: Yes    Partners: Male    Birth control/protection: None  Other Topics Concern   Not on file  Social History Narrative   Not on file   Social Drivers of Health   Financial Resource Strain: Not on file  Food Insecurity: Not on file  Transportation Needs: Not on file  Physical Activity: Not on file  Stress: Not on file  Social Connections: Not  on file    Allergies:  Allergies  Allergen Reactions   Ozempic (0.25 Or 0.5 Mg-Dose) [Semaglutide(0.25 Or 0.5mg -Dos)] Itching and Rash    Metabolic Disorder Labs: Lab Results  Component Value Date   HGBA1C 6.2 (A) 11/05/2023   No results found for: "PROLACTIN" Lab Results  Component Value Date   CHOL 137 02/06/2023   TRIG 95 02/06/2023   HDL 55 02/06/2023   CHOLHDL 5.7 (H) 10/17/2021   LDLCALC 64 02/06/2023   LDLCALC 124 (H) 10/17/2021   Lab Results  Component Value Date   TSH 1.380 11/04/2023   TSH 1.400 06/09/2023    Therapeutic Level Labs: Lab Results  Component Value Date   LITHIUM 0.40 (L) 07/14/2014   No results found for: "VALPROATE" No results found for: "CBMZ"  Current Medications: Current Outpatient Medications  Medication Sig Dispense Refill   ACCU-CHEK AVIVA PLUS test strip SMARTSIG:Via Meter     Accu-Chek Softclix Lancets lancets SMARTSIG:Topical     ALPRAZolam (XANAX) 1 MG tablet Take 1 tablet (1 mg total) by mouth 3 (three) times daily. 90 tablet 2   amphetamine-dextroamphetamine (ADDERALL) 20 MG tablet Take 1 tablet (20 mg total) by mouth 2 (two) times daily. 60 tablet 0   amphetamine-dextroamphetamine (ADDERALL) 20 MG tablet Take 1 tablet (20 mg total) by mouth 2 (two) times daily. 60 tablet 0   amphetamine-dextroamphetamine (ADDERALL) 20 MG tablet Take 1 tablet (20 mg total) by mouth 2 (two) times daily. 60 tablet 0   Azelastine-Fluticasone 137-50 MCG/ACT SUSP Place 1 spray into the nose in the morning and at bedtime. One spray each nostril two times daily     Cariprazine HCl (VRAYLAR) 4.5 MG CAPS Take 1 capsule (4.5 mg total) by mouth daily. 30 capsule 2   carisoprodol (SOMA) 350 MG tablet Take 350 mg by mouth. One bid     DULoxetine (CYMBALTA) 60 MG capsule Take 1 capsule (60 mg total) by mouth 2 (two) times daily. 180 capsule 2   esomeprazole (NEXIUM) 40 MG capsule Take 1 capsule (40 mg total) by mouth 2 (two) times daily. Take 30- 60 min before  your first and last meals of the day 180 capsule 3   fenofibrate (TRICOR) 145 MG tablet Take 1 tablet (145 mg total) by mouth daily. 90 tablet 3   ipratropium (ATROVENT HFA) 17 MCG/ACT inhaler Inhale 2 puffs into the lungs every 6 (six) hours as needed for wheezing. 1 each 11   levocetirizine (XYZAL) 5 MG tablet 1 tablet in the evening     levothyroxine (SYNTHROID) 75 MCG tablet Take 1 tablet (75 mcg total) by mouth daily. 90 tablet 3   naloxegol oxalate (MOVANTIK) 12.5  MG TABS tablet Take 1 tablet (12.5 mg total) by mouth daily. 30 tablet 1   NOVOFINE PEN NEEDLE 32G X 6 MM MISC USE AS DIRECTED 100 each 3   NYSTATIN powder Apply topically.     tirzepatide (MOUNJARO) 10 MG/0.5ML Pen Inject 10 mg into the skin once a week. 6 mL 1   Vitamin D, Ergocalciferol, (DRISDOL) 1.25 MG (50000 UNIT) CAPS capsule TAKE ONE CAPSULE BY MOUTH EVERY 7 DAYS 12 capsule 0   No current facility-administered medications for this visit.     Musculoskeletal: Strength & Muscle Tone: within normal limits Gait & Station: normal Patient leans: N/A  Psychiatric Specialty Exam: Review of Systems  Psychiatric/Behavioral:  Positive for sleep disturbance. The patient is nervous/anxious.   All other systems reviewed and are negative.   There were no vitals taken for this visit.There is no height or weight on file to calculate BMI.  General Appearance: Casual and Fairly Groomed  Eye Contact:  Good  Speech:  Clear and Coherent  Volume:  Normal  Mood:  Anxious and Euthymic  Affect:  Congruent  Thought Process:  Goal Directed  Orientation:  Full (Time, Place, and Person)  Thought Content: Rumination   Suicidal Thoughts:  No  Homicidal Thoughts:  No  Memory:  Immediate;   Good Recent;   Good Remote;   NA  Judgement:  Good  Insight:  Fair  Psychomotor Activity:  Normal  Concentration:  Concentration: Good and Attention Span: Good  Recall:  Good  Fund of Knowledge: Good  Language: Good  Akathisia:  No  Handed:   Right  AIMS (if indicated): not done  Assets:  Communication Skills Desire for Improvement Resilience Social Support  ADL's:  Intact  Cognition: WNL  Sleep:  Fair   Screenings: GAD-7    Flowsheet Row Office Visit from 03/13/2023 in [facility] [place]  Total GAD-7 Score 14      PHQ2-9    Flowsheet Row Office Visit from 03/13/2023 in [facility] [place] Video Visit from 09/26/2022 in [facility] at [place] Video Visit from 08/29/2022 in [facility] [place] Video Visit from 07/04/2022 in [facility] at [place] Video Visit from 06/05/2022 in [facility] at [facility] Total Score 4 0 1 1 1   PHQ-9 Total Score 12 -- -- -- --      Flowsheet Row Office Visit from 03/13/2023 in [facility] [place] Video Visit from 09/26/2022 in [facility] at [place] Video Visit from 08/29/2022 in [facility] at [place]  C-SSRS RISK CATEGORY No Risk No Risk No Risk        Assessment and Plan: This patient is a 48 year old female with a history of bipolar disorder anxiety and ADD.  Since she is no longer on pain medicine and is having increased anxiety we will reinstate the Xanax 1 mg 3 times daily as needed for anxiety.  She will continue Adderall 20 mg in the morning and noon for ADD, Vraylar 4.5 mg daily for mood stabilization and Cymbalta 60 mg twice daily for depression.  She will return to see me in 3 months  Collaboration of Care: Collaboration of Care: Primary Care Provider AEB notes will be shared with PCP at patient's request  Patient/Guardian was advised Release of Information must be obtained prior to any record release in order  to collaborate their care with an outside provider. Patient/Guardian was  advised if they have not already done so to contact the registration department to sign all necessary forms in order for [place] to release information regarding their care.   Consent: Patient/Guardian gives verbal consent for treatment and assignment of benefits for services provided during this visit. Patient/Guardian expressed understanding and agreed to proceed.    Catherine Ruder, MD 02/11/2024, 11:16 AM

## 2024-02-16 ENCOUNTER — Ambulatory Visit (INDEPENDENT_AMBULATORY_CARE_PROVIDER_SITE_OTHER): Payer: MEDICAID | Admitting: Gastroenterology

## 2024-02-20 ENCOUNTER — Telehealth: Payer: Self-pay | Admitting: Primary Care

## 2024-02-20 ENCOUNTER — Telehealth: Payer: MEDICAID | Admitting: Primary Care

## 2024-02-20 DIAGNOSIS — G4733 Obstructive sleep apnea (adult) (pediatric): Secondary | ICD-10-CM

## 2024-02-20 DIAGNOSIS — F17200 Nicotine dependence, unspecified, uncomplicated: Secondary | ICD-10-CM | POA: Diagnosis not present

## 2024-02-20 NOTE — Telephone Encounter (Signed)
 Thank you :)

## 2024-02-20 NOTE — Progress Notes (Signed)
 Virtual Visit via Video Note  I connected with Catherine Howard on 02/20/24 at  1:30 PM EST by a video enabled telemedicine application and verified that I am speaking with the correct person using two identifiers.  Location: Patient: Home Provider: Office    I discussed the limitations of evaluation and management by telemedicine and the availability of in person appointments. The patient expressed understanding and agreed to proceed.  History of Present Illness: 48 year old female, current everyday smoker.  Past medical history significant for COPD Gold 0, OSA on CPAP, GERD, dysphagia, IBS, type 2 diabetes, bipolar 1 disorder, prediabetes, hyperlipidemia, tobacco use disorder.  Patient of Dr. Sherene Sires.  Formally followed by Dr. Craige Cotta for OSA.   02/20/2024 Discussed the use of AI scribe software for clinical note transcription with the patient, who gave verbal consent to proceed.  History of Present Illness   [name] is a 48 year old female with sleep apnea who presents with disrupted sleep due to lack of CPAP use.  She has been experiencing disrupted sleep due to not using her CPAP machine. Her previous CPAP machine became unusable because the water chamber corroded, and she has been without a CPAP machine for approximately five to seven months.  During this period without CPAP therapy, she has experienced poor sleep quality, waking up multiple times throughout the night, typically between six to seven times. She wakes up with nasal congestion and mucus in her throat, symptoms that were not present when she was using the CPAP. Her typical bedtime is early, around 5:00 to 5:30 PM, after taking her medication, and she often wakes up as early as 3:00 to 5:00 AM.  Her last sleep study was conducted in 2019, and she was due for a new CPAP machine in November. She recalls that her CPAP pressure setting was at 11, although she initially mentioned a setting of 4, which she later clarified might have  been the starting pressure that ramps up.      Observations/Objective:  Appears well without overt respiratory symptoms  PSG 08/28/18 >> AHI 16.6, SpO2 low 82%; CPAP 11 cm H2O   Assessment and Plan:    Obstructive Sleep Apnea Patient has been off CPAP therapy for approximately 5-7 months due to machine malfunction. Reports disrupted sleep, frequent awakenings, and nasal congestion since discontinuing therapy. Last sleep study was in 2019 and last recorded pressure setting was 11. -Contact [employer] to verify insurance requirements for CPAP replacement. -If required by insurance, order a home sleep study to reconfirm diagnosis. -If not required by insurance, proceed with order for new CPAP machine with pressure setting of 11. -Instruct patient to sleep on side, avoid alcohol before bedtime, and not to drive when tired. -Follow up with patient by middle of next week.      Follow Up Instructions:  3 months    I discussed the assessment and treatment plan with the patient. The patient was provided an opportunity to ask questions and all were answered. The patient agreed with the plan and demonstrated an understanding of the instructions.   The patient was advised to call back or seek an in-person evaluation if the symptoms worsen or if the condition fails to improve as anticipated.  I provided 22 minutes of non-face-to-face time during this encounter.   Glenford Bayley, NP

## 2024-02-20 NOTE — Telephone Encounter (Signed)
 Patient has hx sleep apnea, last sleep study was done in 2019. She has been off CPAP for 5-7 months due to broken machine. She was eligible for new CPAP machine as of 08/2023. She tells me her insurance told her that she would not need a repeat sleep study in order to be provided an new machine. Her previous DME company was [employer], can we call Nida Boatman or Mitch. If we can proceed with CPAP please place order for replacement CPAP machine at 11cm h20. If she need to repeat sleep study please place order for HST and notify patient. Thanks

## 2024-02-20 NOTE — Telephone Encounter (Signed)
 Left detailed message with Mickeal Needy and Nida Boatman New at [employer].  Will go ahead and place order for replacement CPAP.  If patient does not qualify, we can order HST at this point. Notified patient.

## 2024-02-23 ENCOUNTER — Other Ambulatory Visit (HOSPITAL_COMMUNITY): Payer: Self-pay | Admitting: Nurse Practitioner

## 2024-02-23 DIAGNOSIS — M549 Dorsalgia, unspecified: Secondary | ICD-10-CM

## 2024-02-23 DIAGNOSIS — M542 Cervicalgia: Secondary | ICD-10-CM

## 2024-02-28 ENCOUNTER — Encounter (HOSPITAL_COMMUNITY): Payer: Self-pay

## 2024-02-28 ENCOUNTER — Ambulatory Visit (HOSPITAL_COMMUNITY): Payer: MEDICAID

## 2024-03-01 ENCOUNTER — Telehealth: Payer: Self-pay | Admitting: Primary Care

## 2024-03-01 NOTE — Telephone Encounter (Signed)
 Catherine Babinski,   Do you see where Waynetta Sandy signed this? Does anything else need to be done?

## 2024-03-01 NOTE — Telephone Encounter (Signed)
 Patient states she called [employer] [facility] regarding the signed order for her CPAP machine and per [facility]--the signature was cut off the order---can we resend----patient call back [phone]

## 2024-03-01 NOTE — Telephone Encounter (Signed)
 We will have to print and fax. NFN

## 2024-03-01 NOTE — Telephone Encounter (Signed)
 Just spoke with patient and informed her the order for her new CPAP machine was signed.  Patient is going to call [employer] [facility] and let them know.  Requested she call us back if there was anything else we could do

## 2024-03-01 NOTE — Telephone Encounter (Signed)
 Patient was seen by Ames Dura, NP on 02/20/24 and was told an order for a new CPAP machine would be sent to [facility] [facility]---the order was sent but not signed----patient call back [phone]

## 2024-03-01 NOTE — Telephone Encounter (Signed)
 Date/Time Action Taken User Additional Information  02/20/24 1433 Sign Franco Nones, CMA Ordering Mode: Verbal with Read Back: Cosign Required  02/20/24 1503 Verbal Cosign Glenford Bayley, NP    It looks like I signed

## 2024-03-01 NOTE — Telephone Encounter (Signed)
 It says [employer] had received this already. Catherine Howard, is it too late for you to sign order, or should I create a new order?

## 2024-03-03 ENCOUNTER — Ambulatory Visit: Payer: MEDICAID | Admitting: Orthopaedic Surgery

## 2024-03-03 ENCOUNTER — Encounter: Payer: Self-pay | Admitting: Orthopaedic Surgery

## 2024-03-03 ENCOUNTER — Other Ambulatory Visit (INDEPENDENT_AMBULATORY_CARE_PROVIDER_SITE_OTHER): Payer: MEDICAID

## 2024-03-03 VITALS — BP 125/84 | HR 106 | Ht 63.0 in | Wt 194.0 lb

## 2024-03-03 DIAGNOSIS — M79602 Pain in left arm: Secondary | ICD-10-CM | POA: Diagnosis not present

## 2024-03-03 NOTE — Progress Notes (Signed)
 Subjective:    Patient ID: Catherine Howard, female    DOB: [birth year], 48 y.o.   MRN: [id number]  HPI She has developed some swelling and a "knot" on the mid to distal third of the left nondominant forearm dorsally.  It is not red.  It bothers her at times.  She has no trauma.  She has no problems with making a fist or moving her wrist.  She has no other problems.   Review of Systems  Constitutional:  Positive for activity change.  Musculoskeletal:  Positive for arthralgias.  All other systems reviewed and are negative. For Review of Systems, all other systems reviewed and are negative.  The following is a summary of the past history medically, past history surgically, known current medicines, social history and family history.  This information is gathered electronically by the computer from prior information and documentation.  I review this each visit and have found including this information at this point in the chart is beneficial and informative.   Past Medical History:  Diagnosis Date   ADHD    Bipolar I disorder (HCC)    Elevated cholesterol    GERD (gastroesophageal reflux disease)    Thyroid disease    Type 2 diabetes mellitus (HCC)    Vitamin D deficiency     Past Surgical History:  Procedure Laterality Date   CESAREAN SECTION     DILATION AND CURETTAGE OF UTERUS      Current Outpatient Medications on File Prior to Visit  Medication Sig Dispense Refill   ACCU-CHEK AVIVA PLUS test strip SMARTSIG:Via Meter     Accu-Chek Softclix Lancets lancets SMARTSIG:Topical     ALPRAZolam (XANAX) 1 MG tablet Take 1 tablet (1 mg total) by mouth 3 (three) times daily. 90 tablet 2   amphetamine-dextroamphetamine (ADDERALL) 20 MG tablet Take 1 tablet (20 mg total) by mouth 2 (two) times daily. 60 tablet 0   amphetamine-dextroamphetamine (ADDERALL) 20 MG tablet Take 1 tablet (20 mg total) by mouth 2 (two) times daily. 60 tablet 0   amphetamine-dextroamphetamine (ADDERALL) 20 MG  tablet Take 1 tablet (20 mg total) by mouth 2 (two) times daily. 60 tablet 0   atorvastatin (LIPITOR) 40 MG tablet Take 40 mg by mouth daily.     Azelastine-Fluticasone 137-50 MCG/ACT SUSP Place 1 spray into the nose in the morning and at bedtime. One spray each nostril two times daily     Cariprazine HCl (VRAYLAR) 4.5 MG CAPS Take 1 capsule (4.5 mg total) by mouth daily. 30 capsule 2   carisoprodol (SOMA) 350 MG tablet Take 350 mg by mouth. One bid     DULoxetine (CYMBALTA) 60 MG capsule Take 1 capsule (60 mg total) by mouth 2 (two) times daily. 180 capsule 2   esomeprazole (NEXIUM) 40 MG capsule Take 1 capsule (40 mg total) by mouth 2 (two) times daily. Take 30- 60 min before your first and last meals of the day 180 capsule 3   fenofibrate (TRICOR) 145 MG tablet Take 1 tablet (145 mg total) by mouth daily. 90 tablet 3   ipratropium (ATROVENT HFA) 17 MCG/ACT inhaler Inhale 2 puffs into the lungs every 6 (six) hours as needed for wheezing. 1 each 11   levocetirizine (XYZAL) 5 MG tablet 1 tablet in the evening     levothyroxine (SYNTHROID) 75 MCG tablet Take 1 tablet (75 mcg total) by mouth daily. 90 tablet 3   naloxegol oxalate (MOVANTIK) 12.5 MG TABS tablet Take 1 tablet (12.5 mg total)  by mouth daily. 30 tablet 1   NOVOFINE PEN NEEDLE 32G X 6 MM MISC USE AS DIRECTED 100 each 3   NYSTATIN powder Apply topically.     tirzepatide (MOUNJARO) 10 MG/0.5ML Pen Inject 10 mg into the skin once a week. 6 mL 1   Vitamin D, Ergocalciferol, (DRISDOL) 1.25 MG (50000 UNIT) CAPS capsule TAKE ONE CAPSULE BY MOUTH EVERY 7 DAYS 12 capsule 0   No current facility-administered medications on file prior to visit.    Social History   Socioeconomic History   Marital status: Single    Spouse name: Not on file   Number of children: Not on file   Years of education: Not on file   Highest education level: Not on file  Occupational History   Not on file  Tobacco Use   Smoking status: Every Day    Current  packs/day: 2.00    Average packs/day: 2.0 packs/day for 32.0 years (64.0 ttl pk-yrs)    Types: Cigarettes   Smokeless tobacco: Never   Tobacco comments:    smokes 3/4 of a pack a day--01/22/2021  Vaping Use   Vaping status: Former  Substance and Sexual Activity   Alcohol use: No    Alcohol/week: 0.0 standard drinks of alcohol   Drug use: No   Sexual activity: Yes    Partners: Male    Birth control/protection: None  Other Topics Concern   Not on file  Social History Narrative   Not on file   Social Drivers of Health   Financial Resource Strain: Not on file  Food Insecurity: Not on file  Transportation Needs: Not on file  Physical Activity: Not on file  Stress: Not on file  Social Connections: Not on file  Intimate Partner Violence: Not on file    Family History  Problem Relation Age of Onset   Schizophrenia Father    Alcohol abuse Father    Diabetes Father    Hyperlipidemia Father    Alcohol abuse Paternal Uncle     BP 125/84   Pulse (!) 106   Ht 5\' 3"  (1.6 m)   Wt 194 lb (88 kg)   BMI 34.37 kg/m   Body mass index is 34.37 kg/m.      Objective:   Physical Exam Vitals and nursing note reviewed. Exam conducted with a chaperone present.  Constitutional:      Appearance: She is well-developed.  HENT:     Head: Normocephalic and atraumatic.  Eyes:     Conjunctiva/sclera: Conjunctivae normal.     Pupils: Pupils are equal, round, and reactive to light.  Cardiovascular:     Rate and Rhythm: Normal rate and regular rhythm.  Pulmonary:     Effort: Pulmonary effort is normal.  Abdominal:     Palpations: Abdomen is soft.  Musculoskeletal:       Arms:     Cervical back: Normal range of motion and neck supple.  Skin:    General: Skin is warm and dry.  Neurological:     Mental Status: She is alert and oriented to person, place, and time.     Cranial Nerves: No cranial nerve deficit.     Motor: No abnormal muscle tone.     Coordination: Coordination normal.      Deep Tendon Reflexes: Reflexes are normal and symmetric. Reflexes normal.  Psychiatric:        Behavior: Behavior normal.        Thought Content: Thought content normal.  Judgment: Judgment normal.   X-rays were done of the left forearm, reported separately.        Assessment & Plan:   Encounter Diagnosis  Name Primary?   Pain in left arm Yes   I think this is more of a lipoma.  She will need to just observe it.  She can use Aspercreme, Biofreeze or Voltaren gel if she likes.  I will see prn.  Call if any problem.  Precautions discussed.  Electronically Signed Darreld Mclean, MD 3/12/20258:33 AM

## 2024-03-04 ENCOUNTER — Ambulatory Visit: Payer: MEDICAID | Admitting: Nurse Practitioner

## 2024-03-04 DIAGNOSIS — E039 Hypothyroidism, unspecified: Secondary | ICD-10-CM

## 2024-03-04 DIAGNOSIS — Z7985 Long-term (current) use of injectable non-insulin antidiabetic drugs: Secondary | ICD-10-CM

## 2024-03-04 DIAGNOSIS — E559 Vitamin D deficiency, unspecified: Secondary | ICD-10-CM

## 2024-03-04 DIAGNOSIS — E782 Mixed hyperlipidemia: Secondary | ICD-10-CM

## 2024-03-04 DIAGNOSIS — E119 Type 2 diabetes mellitus without complications: Secondary | ICD-10-CM

## 2024-03-06 ENCOUNTER — Ambulatory Visit (HOSPITAL_COMMUNITY): Payer: MEDICAID

## 2024-03-06 ENCOUNTER — Encounter (HOSPITAL_COMMUNITY): Payer: Self-pay

## 2024-03-06 ENCOUNTER — Ambulatory Visit (HOSPITAL_COMMUNITY): Admission: RE | Admit: 2024-03-06 | Payer: MEDICAID | Source: Ambulatory Visit

## 2024-03-22 ENCOUNTER — Telehealth (HOSPITAL_COMMUNITY): Payer: MEDICAID | Admitting: Psychiatry

## 2024-04-05 ENCOUNTER — Ambulatory Visit: Payer: MEDICAID | Admitting: Primary Care

## 2024-04-13 ENCOUNTER — Other Ambulatory Visit: Payer: Self-pay | Admitting: Nurse Practitioner

## 2024-04-25 ENCOUNTER — Other Ambulatory Visit (HOSPITAL_COMMUNITY): Payer: Self-pay | Admitting: Psychiatry

## 2024-05-03 ENCOUNTER — Other Ambulatory Visit (HOSPITAL_COMMUNITY): Payer: Self-pay | Admitting: Psychiatry

## 2024-05-05 ENCOUNTER — Encounter: Payer: Self-pay | Admitting: Orthopaedic Surgery

## 2024-05-05 ENCOUNTER — Ambulatory Visit: Payer: MEDICAID | Admitting: Orthopaedic Surgery

## 2024-05-05 DIAGNOSIS — M542 Cervicalgia: Secondary | ICD-10-CM

## 2024-05-05 DIAGNOSIS — G894 Chronic pain syndrome: Secondary | ICD-10-CM | POA: Diagnosis not present

## 2024-05-05 MED ORDER — DICLOFENAC SODIUM 75 MG PO TBEC: 75.0000 mg | DELAYED_RELEASE_TABLET | Freq: Two times a day (BID) | ORAL | 2 refills | Status: AC

## 2024-05-05 NOTE — Patient Instructions (Addendum)
 Need records from pain clinic be sure to get these so we can go over this with you. Please bring this to our office. You can have the notes emailed to me at [email or web address] if they will.  Schedule appt in 2 weeks with Dr. Iline Mallory once you get the notes.

## 2024-05-05 NOTE — Progress Notes (Signed)
 I have chronic neck pain.  She has a long history of chronic neck and back pain.  She has been to a pain clinic and they were to send notes.  I do not have the notes to see.  She has since decided not go to back to the pain clinic.  She is on Cymbalta  from her psychiatrist.    I have told her I cannot give any pain medicine until I review the pain clinic notes.  I need to see what has been done and tried.  She has good ROM of the neck, no spasm, NV intact, good grips.  Encounter Diagnoses  Name Primary?   Neck pain Yes   Chronic pain syndrome    I will see her in two weeks.  Hopefully I can have the notes to review.  Call if any problem.  Precautions discussed.  Electronically Signed Pleasant Brilliant, MD 5/14/202510:57 AM

## 2024-05-06 ENCOUNTER — Other Ambulatory Visit (HOSPITAL_COMMUNITY): Payer: Self-pay | Admitting: Psychiatry

## 2024-05-10 ENCOUNTER — Telehealth (HOSPITAL_COMMUNITY): Payer: MEDICAID | Admitting: Psychiatry

## 2024-05-10 ENCOUNTER — Encounter (HOSPITAL_COMMUNITY): Payer: Self-pay | Admitting: Psychiatry

## 2024-05-10 DIAGNOSIS — F902 Attention-deficit hyperactivity disorder, combined type: Secondary | ICD-10-CM | POA: Diagnosis not present

## 2024-05-10 DIAGNOSIS — F3162 Bipolar disorder, current episode mixed, moderate: Secondary | ICD-10-CM | POA: Diagnosis not present

## 2024-05-10 MED ORDER — DULOXETINE HCL 60 MG PO CPEP: 60.0000 mg | ORAL_CAPSULE | Freq: Two times a day (BID) | ORAL | 2 refills | Status: DC

## 2024-05-10 MED ORDER — AMPHETAMINE-DEXTROAMPHETAMINE 20 MG PO TABS: 20.0000 mg | ORAL_TABLET | Freq: Two times a day (BID) | ORAL | 0 refills | Status: DC

## 2024-05-10 MED ORDER — VRAYLAR 4.5 MG PO CAPS: 1.0000 | ORAL_CAPSULE | Freq: Every day | ORAL | 2 refills | Status: DC

## 2024-05-10 MED ORDER — BELSOMRA 20 MG PO TABS: 20.0000 mg | ORAL_TABLET | Freq: Every day | ORAL | 2 refills | Status: DC

## 2024-05-10 MED ORDER — ALPRAZOLAM 1 MG PO TABS: 1.0000 mg | ORAL_TABLET | Freq: Three times a day (TID) | ORAL | 2 refills | Status: DC

## 2024-05-10 NOTE — Progress Notes (Signed)
 Virtual Visit via Video Note  I connected with Catherine Howard on 05/10/24 at  1:20 PM EDT by a video enabled telemedicine application and verified that I am speaking with the correct person using two identifiers.  Location: Patient: home Provider: office   I discussed the limitations of evaluation and management by telemedicine and the availability of in person appointments. The patient expressed understanding and agreed to proceed.     I discussed the assessment and treatment plan with the patient. The patient was provided an opportunity to ask questions and all were answered. The patient agreed with the plan and demonstrated an understanding of the instructions.   The patient was advised to call back or seek an in-person evaluation if the symptoms worsen or if the condition fails to improve as anticipated.  I provided 20 minutes of non-face-to-face time during this encounter.   Alfredia Annas, MD  [facility] MD/PA/NP OP Progress Note  05/10/2024 1:39 PM Catherine Howard  MRN:  [id number]  Chief Complaint:  Chief Complaint  Patient presents with   Depression   Anxiety   ADHD   Manic Behavior   HPI: This patient is a 48 year old divorced white female who lives with her fianc and son in [place]. She had a 69 year old son died of a drug overdose in 05/30/13. The patient does not work and is on disability.   The patient returns for follow-up after 3 months regarding her bipolar disorder anxiety and ADHD.  She states that she has been upset lately because her father's lung cancer was in remission but has now returned.  She also found out that her uncle has dementia.  These 2 things have been really worrying her.  She is not sleeping that well.  She is currently not taking any medication for sleep other than Xanax .  She has tried quite a few others such as Restoril  trazodone  nortriptyline.  She was on Belsomra  at the 15 mg dosage in the past but I suggested we go up to a 20 mg dosage.  She claims she  is getting some exercise and is limiting caffeine.  She feels her mood is stable for the most part particular given the circumstances.  She denies any thoughts of self-harm.  The Adderall continues to help her focus to some degree. Visit Diagnosis:    ICD-10-CM   1. Bipolar 1 disorder, mixed, moderate (HCC)  F31.62     2. Attention deficit hyperactivity disorder (ADHD), combined type  F90.2       Past Psychiatric History: Previous outpatient treatment  Past Medical History:  Past Medical History:  Diagnosis Date   ADHD    Bipolar I disorder (HCC)    Elevated cholesterol    GERD (gastroesophageal reflux disease)    Thyroid disease    Type 2 diabetes mellitus (HCC)    Vitamin D  deficiency     Past Surgical History:  Procedure Laterality Date   CESAREAN SECTION     DILATION AND CURETTAGE OF UTERUS      Family Psychiatric History: See below  Family History:  Family History  Problem Relation Age of Onset   Schizophrenia Father    Alcohol abuse Father    Diabetes Father    Hyperlipidemia Father    Alcohol abuse Paternal Uncle     Social History:  Social History   Socioeconomic History   Marital status: Single    Spouse name: Not on file   Number of children: Not on file   Years of education:  Not on file   Highest education level: Not on file  Occupational History   Not on file  Tobacco Use   Smoking status: Every Day    Current packs/day: 2.00    Average packs/day: 2.0 packs/day for 32.0 years (64.0 ttl pk-yrs)    Types: Cigarettes   Smokeless tobacco: Never   Tobacco comments:    smokes 3/4 of a pack a day--01/22/2021  Vaping Use   Vaping status: Former  Substance and Sexual Activity   Alcohol use: No    Alcohol/week: 0.0 standard drinks of alcohol   Drug use: No   Sexual activity: Yes    Partners: Male    Birth control/protection: None  Other Topics Concern   Not on file  Social History Narrative   Not on file   Social Drivers of Health    Financial Resource Strain: Not on file  Food Insecurity: Not on file  Transportation Needs: Not on file  Physical Activity: Not on file  Stress: Not on file  Social Connections: Not on file    Allergies:  Allergies  Allergen Reactions   Oxycodone-Acetaminophen Itching   Ozempic  (0.25 Or 0.5 Mg-Dose) [Semaglutide (0.25 Or 0.5mg -Dos)] Itching and Rash    Metabolic Disorder Labs: Lab Results  Component Value Date   HGBA1C 6.2 (A) 11/05/2023   No results found for: "PROLACTIN" Lab Results  Component Value Date   CHOL 137 02/06/2023   TRIG 95 02/06/2023   HDL 55 02/06/2023   CHOLHDL 5.7 (H) 10/17/2021   LDLCALC 64 02/06/2023   LDLCALC 124 (H) 10/17/2021   Lab Results  Component Value Date   TSH 1.380 11/04/2023   TSH 1.400 06/09/2023    Therapeutic Level Labs: Lab Results  Component Value Date   LITHIUM  0.40 (L) 07/14/2014   No results found for: "VALPROATE" No results found for: "CBMZ"  Current Medications: Current Outpatient Medications  Medication Sig Dispense Refill   Suvorexant  (BELSOMRA ) 20 MG TABS Take 1 tablet (20 mg total) by mouth at bedtime. 30 tablet 2   ACCU-CHEK AVIVA PLUS test strip SMARTSIG:Via Meter     Accu-Chek Softclix Lancets lancets SMARTSIG:Topical     ALPRAZolam  (XANAX ) 1 MG tablet Take 1 tablet (1 mg total) by mouth 3 (three) times daily. 90 tablet 2   amphetamine -dextroamphetamine  (ADDERALL) 20 MG tablet Take 1 tablet (20 mg total) by mouth 2 (two) times daily. 60 tablet 0   amphetamine -dextroamphetamine  (ADDERALL) 20 MG tablet Take 1 tablet (20 mg total) by mouth 2 (two) times daily. 60 tablet 0   amphetamine -dextroamphetamine  (ADDERALL) 20 MG tablet Take 1 tablet (20 mg total) by mouth 2 (two) times daily. 60 tablet 0   atorvastatin (LIPITOR) 40 MG tablet Take 40 mg by mouth daily.     Azelastine-Fluticasone 137-50 MCG/ACT SUSP Place 1 spray into the nose in the morning and at bedtime. One spray each nostril two times daily      Cariprazine  HCl (VRAYLAR ) 4.5 MG CAPS Take 1 capsule (4.5 mg total) by mouth daily. 30 capsule 2   carisoprodol (SOMA) 350 MG tablet Take 350 mg by mouth. One bid     diclofenac  (VOLTAREN ) 75 MG EC tablet Take 1 tablet (75 mg total) by mouth 2 (two) times daily with a meal. 60 tablet 2   DULoxetine  (CYMBALTA ) 60 MG capsule Take 1 capsule (60 mg total) by mouth 2 (two) times daily. 180 capsule 2   esomeprazole  (NEXIUM ) 40 MG capsule Take 1 capsule (40 mg total) by mouth 2 (  two) times daily. Take 30- 60 min before your first and last meals of the day 180 capsule 3   fenofibrate  (TRICOR ) 145 MG tablet Take 1 tablet (145 mg total) by mouth daily. 90 tablet 3   ipratropium (ATROVENT  HFA) 17 MCG/ACT inhaler Inhale 2 puffs into the lungs every 6 (six) hours as needed for wheezing. 1 each 11   ipratropium (ATROVENT ) 0.03 % nasal spray USE TWO SPRAYS in each nostril TWICE DAILY x10 DAYS AS NEEDED     levocetirizine (XYZAL) 5 MG tablet 1 tablet in the evening     levothyroxine  (SYNTHROID ) 75 MCG tablet Take 1 tablet (75 mcg total) by mouth daily. 90 tablet 3   montelukast (SINGULAIR) 10 MG tablet SMARTSIG:1 Tablet(s) By Mouth Every Evening     naloxegol  oxalate (MOVANTIK ) 12.5 MG TABS tablet Take 1 tablet (12.5 mg total) by mouth daily. 30 tablet 1   NOVOFINE PEN NEEDLE 32G X 6 MM MISC USE AS DIRECTED 100 each 3   NYSTATIN powder Apply topically.     SUMAtriptan (IMITREX) 100 MG tablet Take by mouth.     tirzepatide  (MOUNJARO ) 10 MG/0.5ML Pen Inject 10 mg into the skin once a week. 6 mL 1   TRULANCE 3 MG TABS Take 1 tablet by mouth daily.     Vitamin D , Ergocalciferol , (DRISDOL ) 1.25 MG (50000 UNIT) CAPS capsule TAKE ONE CAPSULE BY MOUTH EVERY 7 DAYS 12 capsule 0   No current facility-administered medications for this visit.     Musculoskeletal: Strength & Muscle Tone: within normal limits Gait & Station: normal Patient leans: N/A  Psychiatric Specialty Exam: Review of Systems   Psychiatric/Behavioral:  The patient is nervous/anxious.   All other systems reviewed and are negative.   There were no vitals taken for this visit.There is no height or weight on file to calculate BMI.  General Appearance: Casual and Fairly Groomed  Eye Contact:  Good  Speech:  Clear and Coherent  Volume:  Normal  Mood:  Anxious and Euthymic  Affect:  Congruent  Thought Process:  Goal Directed  Orientation:  Full (Time, Place, and Person)  Thought Content: Rumination   Suicidal Thoughts:  No  Homicidal Thoughts:  No  Memory:  Immediate;   Good Recent;   Good Remote;   Fair  Judgement:  Good  Insight:  Fair  Psychomotor Activity:  Decreased  Concentration:  Concentration: Fair and Attention Span: Fair  Recall:  Good  Fund of Knowledge: Good  Language: Good  Akathisia:  No  Handed:  Right  AIMS (if indicated): not done  Assets:  Communication Skills Desire for Improvement Resilience Social Support  ADL's:  Intact  Cognition: WNL  Sleep:  Poor   Screenings: GAD-7    Flowsheet Row Office Visit from 03/13/2023 in [facility] [place]  Total GAD-7 Score 14      PHQ2-9    Flowsheet Row Office Visit from 03/13/2023 in [facility] [place] Video Visit from 09/26/2022 in [facility] at [place] Video Visit from 08/29/2022 in [facility] at [place] Video Visit from 07/04/2022 in [facility] at [place] Video Visit from 06/05/2022 in [facility] at [facility] Total Score 4 0 1 1 1   PHQ-9 Total Score 12 -- -- -- --      Flowsheet Row Office Visit from 03/13/2023 in [facility] at [place] Video Visit  from 09/26/2022 in [facility] at [place] Video Visit from 08/29/2022 in [facility] at [place]   C-SSRS RISK CATEGORY No Risk No Risk No Risk        Assessment and Plan: This patient is a 48 year old female with a history of bipolar disorder anxiety and ADD.  She is not sleeping so well so we will add Belsomra  20 mg at bedtime.  She will continue Xanax  1 mg 3 times daily as needed for anxiety, Adderall 20 mg twice daily for ADD, Vraylar  4.5 mg daily for mood stabilization and Cymbalta  60 mg twice daily for depression.  She will return to see me in 3 months  Collaboration of Care: Collaboration of Care: Notes are shared with PCP at patient's request  Patient/Guardian was advised Release of Information must be obtained prior to any record release in order to collaborate their care with an outside provider. Patient/Guardian was advised if they have not already done so to contact the registration department to sign all necessary forms in order for us  to release information regarding their care.   Consent: Patient/Guardian gives verbal consent for treatment and assignment of benefits for services provided during this visit. Patient/Guardian expressed understanding and agreed to proceed.    Alfredia Annas, MD 05/10/2024, 1:40 PM

## 2024-05-13 ENCOUNTER — Ambulatory Visit (INDEPENDENT_AMBULATORY_CARE_PROVIDER_SITE_OTHER): Payer: MEDICAID | Admitting: Nurse Practitioner

## 2024-05-13 ENCOUNTER — Encounter: Payer: Self-pay | Admitting: Nurse Practitioner

## 2024-05-13 VITALS — BP 108/76 | HR 104 | Ht 63.5 in | Wt 191.2 lb

## 2024-05-13 DIAGNOSIS — E782 Mixed hyperlipidemia: Secondary | ICD-10-CM | POA: Diagnosis not present

## 2024-05-13 DIAGNOSIS — E039 Hypothyroidism, unspecified: Secondary | ICD-10-CM

## 2024-05-13 DIAGNOSIS — E559 Vitamin D deficiency, unspecified: Secondary | ICD-10-CM | POA: Diagnosis not present

## 2024-05-13 DIAGNOSIS — Z7985 Long-term (current) use of injectable non-insulin antidiabetic drugs: Secondary | ICD-10-CM

## 2024-05-13 DIAGNOSIS — E119 Type 2 diabetes mellitus without complications: Secondary | ICD-10-CM

## 2024-05-13 LAB — POCT GLYCOSYLATED HEMOGLOBIN (HGB A1C): Hemoglobin A1C: 6.4 % — AB (ref 4.0–5.6)

## 2024-05-13 MED ORDER — VITAMIN D (ERGOCALCIFEROL) 1.25 MG (50000 UNIT) PO CAPS: 50000.0000 [IU] | ORAL_CAPSULE | ORAL | 3 refills | Status: AC

## 2024-05-13 MED ORDER — LEVOTHYROXINE SODIUM 75 MCG PO TABS: 75.0000 ug | ORAL_TABLET | Freq: Every day | ORAL | 3 refills | Status: DC

## 2024-05-13 MED ORDER — TIRZEPATIDE 10 MG/0.5ML ~~LOC~~ SOAJ: 10.0000 mg | SUBCUTANEOUS | 1 refills | Status: DC

## 2024-05-13 MED ORDER — FENOFIBRATE 145 MG PO TABS: 145.0000 mg | ORAL_TABLET | Freq: Every day | ORAL | 3 refills | Status: AC

## 2024-05-13 NOTE — Progress Notes (Signed)
 05/13/2024, 11:42 AM Endocrinology Follow Up Visit   Subjective:    Patient ID: Catherine Howard, female    DOB: Apr 11, 1976.  Catherine Howard is being seen in follow up for management of currently uncontrolled symptomatic diabetes requested by  Jonah Negus, NP.   Past Medical History:  Diagnosis Date   ADHD    Bipolar I disorder (HCC)    Elevated cholesterol    GERD (gastroesophageal reflux disease)    Thyroid disease    Type 2 diabetes mellitus (HCC)    Vitamin D  deficiency     Past Surgical History:  Procedure Laterality Date   CESAREAN SECTION     DILATION AND CURETTAGE OF UTERUS      Social History   Socioeconomic History   Marital status: Single    Spouse name: Not on file   Number of children: Not on file   Years of education: Not on file   Highest education level: Not on file  Occupational History   Not on file  Tobacco Use   Smoking status: Every Day    Current packs/day: 2.00    Average packs/day: 2.0 packs/day for 32.0 years (64.0 ttl pk-yrs)    Types: Cigarettes   Smokeless tobacco: Never   Tobacco comments:    smokes 3/4 of a pack a day--01/22/2021  Vaping Use   Vaping status: Former  Substance and Sexual Activity   Alcohol use: No    Alcohol/week: 0.0 standard drinks of alcohol   Drug use: No   Sexual activity: Yes    Partners: Male    Birth control/protection: None  Other Topics Concern   Not on file  Social History Narrative   Not on file   Social Drivers of Health   Financial Resource Strain: Not on file  Food Insecurity: Not on file  Transportation Needs: Not on file  Physical Activity: Not on file  Stress: Not on file  Social Connections: Not on file    Family History  Problem Relation Age of Onset   Schizophrenia Father    Alcohol abuse Father    Diabetes Father    Hyperlipidemia Father    Alcohol abuse Paternal Uncle      Outpatient Encounter Medications as of 05/13/2024  Medication Sig   ACCU-CHEK AVIVA PLUS test strip SMARTSIG:Via Meter   Accu-Chek Softclix Lancets lancets SMARTSIG:Topical   ALPRAZolam  (XANAX ) 1 MG tablet Take 1 tablet (1 mg total) by mouth 3 (three) times daily.   amphetamine -dextroamphetamine  (ADDERALL) 20 MG tablet Take 1 tablet (20 mg total) by mouth 2 (two) times daily.   atorvastatin (LIPITOR) 40 MG tablet Take 40 mg by mouth daily.   Azelastine-Fluticasone 137-50 MCG/ACT SUSP Place 1 spray into the nose in the morning and at bedtime. One spray each nostril two times daily   Cariprazine  HCl (VRAYLAR ) 4.5 MG CAPS Take 1 capsule (4.5 mg total) by mouth daily.   DULoxetine  (CYMBALTA ) 60 MG capsule Take 1 capsule (60 mg total) by mouth 2 (two) times daily.   esomeprazole  (NEXIUM ) 40 MG capsule Take 1 capsule (40 mg total) by mouth 2 (  two) times daily. Take 30- 60 min before your first and last meals of the day   ipratropium (ATROVENT  HFA) 17 MCG/ACT inhaler Inhale 2 puffs into the lungs every 6 (six) hours as needed for wheezing.   ipratropium (ATROVENT ) 0.03 % nasal spray USE TWO SPRAYS in each nostril TWICE DAILY x10 DAYS AS NEEDED   levocetirizine (XYZAL) 5 MG tablet 1 tablet in the evening   montelukast (SINGULAIR) 10 MG tablet SMARTSIG:1 Tablet(s) By Mouth Every Evening   naloxegol  oxalate (MOVANTIK ) 12.5 MG TABS tablet Take 1 tablet (12.5 mg total) by mouth daily.   NOVOFINE PEN NEEDLE 32G X 6 MM MISC USE AS DIRECTED   NYSTATIN powder Apply topically.   Suvorexant  (BELSOMRA ) 20 MG TABS Take 1 tablet (20 mg total) by mouth at bedtime.   [DISCONTINUED] fenofibrate  (TRICOR ) 145 MG tablet Take 1 tablet (145 mg total) by mouth daily.   [DISCONTINUED] levothyroxine  (SYNTHROID ) 75 MCG tablet Take 1 tablet (75 mcg total) by mouth daily.   [DISCONTINUED] tirzepatide  (MOUNJARO ) 10 MG/0.5ML Pen Inject 10 mg into the skin once a week.   [DISCONTINUED] TRULANCE 3 MG TABS Take 1 tablet by mouth  daily.   [DISCONTINUED] Vitamin D , Ergocalciferol , (DRISDOL ) 1.25 MG (50000 UNIT) CAPS capsule TAKE ONE CAPSULE BY MOUTH EVERY 7 DAYS   amphetamine -dextroamphetamine  (ADDERALL) 20 MG tablet Take 1 tablet (20 mg total) by mouth 2 (two) times daily. (Patient not taking: Reported on 05/13/2024)   amphetamine -dextroamphetamine  (ADDERALL) 20 MG tablet Take 1 tablet (20 mg total) by mouth 2 (two) times daily. (Patient not taking: Reported on 05/13/2024)   diclofenac  (VOLTAREN ) 75 MG EC tablet Take 1 tablet (75 mg total) by mouth 2 (two) times daily with a meal.   fenofibrate  (TRICOR ) 145 MG tablet Take 1 tablet (145 mg total) by mouth daily.   levothyroxine  (SYNTHROID ) 75 MCG tablet Take 1 tablet (75 mcg total) by mouth daily.   SUMAtriptan (IMITREX) 100 MG tablet Take by mouth.   tirzepatide  (MOUNJARO ) 10 MG/0.5ML Pen Inject 10 mg into the skin once a week.   Vitamin D , Ergocalciferol , (DRISDOL ) 1.25 MG (50000 UNIT) CAPS capsule Take 1 capsule (50,000 Units total) by mouth every 7 (seven) days.   [DISCONTINUED] carisoprodol (SOMA) 350 MG tablet Take 350 mg by mouth. One bid (Patient not taking: Reported on 05/13/2024)   No facility-administered encounter medications on file as of 05/13/2024.    ALLERGIES: Allergies  Allergen Reactions   Oxycodone-Acetaminophen Itching   Ozempic  (0.25 Or 0.5 Mg-Dose) [Semaglutide (0.25 Or 0.5mg -Dos)] Itching and Rash    VACCINATION STATUS: Immunization History  Administered Date(s) Administered   Tdap 01/28/2017    Diabetes She presents for her follow-up diabetic visit. She has type 2 diabetes mellitus. Onset time: She was diagnosed at approximate age of 1 years. Her disease course has been stable. There are no hypoglycemic associated symptoms. Pertinent negatives for hypoglycemia include no confusion, headaches, nervousness/anxiousness, pallor or seizures. Associated symptoms include fatigue and weight loss. Pertinent negatives for diabetes include no chest pain,  no foot ulcerations, no polydipsia, no polyphagia, no polyuria and no visual change. There are no hypoglycemic complications. Symptoms are stable. There are no diabetic complications. Risk factors for coronary artery disease include diabetes mellitus, dyslipidemia, obesity, sedentary lifestyle, tobacco exposure and hypertension. Current diabetic treatments: Mounjaro . She is compliant with treatment most of the time. Her weight is decreasing steadily. She is following a generally healthy diet. When asked about meal planning, she reported none. She has not had a previous  visit with a dietitian. She rarely participates in exercise. (She presents today with no meter or logs to review, was not asked to routinely monitor given safe regimen with Mounjaro  only.  Her POCT A1c today is 6.4%, increasing slightly from last visit of 6.2%.  She denies any unpleasant side effects of this medication.  She does report she started smoking once again but not as much as she was last visit.  She expresses many stressors, boyfriends health is not good, neither is her dads.) An ACE inhibitor/angiotensin II receptor blocker is not being taken. She does not see a podiatrist.Eye exam is current.  Hyperlipidemia This is a chronic problem. The current episode started more than 1 year ago. The problem is uncontrolled (yet improving). Recent lipid tests were reviewed and are high. Exacerbating diseases include diabetes, hypothyroidism and obesity. Factors aggravating her hyperlipidemia include fatty foods and smoking. Pertinent negatives include no chest pain, myalgias or shortness of breath. Current antihyperlipidemic treatment includes statins and fibric acid derivatives. The current treatment provides moderate improvement of lipids. Compliance problems include adherence to diet and adherence to exercise.  Risk factors for coronary artery disease include diabetes mellitus, dyslipidemia, a sedentary lifestyle, obesity and hypertension.     Review of systems  Constitutional: + steadily decreasing body weight,  current Body mass index is 33.34 kg/m. , + fatigue, + subjective hyperthermia, no subjective hypothermia Eyes: no blurry vision, no xerophthalmia ENT: no sore throat, no nodules palpated in throat, no dysphagia/odynophagia, no hoarseness Cardiovascular: no chest pain, no shortness of breath, no palpitations, no leg swelling Respiratory: no cough, no shortness of breath Gastrointestinal: no nausea/vomiting/diarrhea Musculoskeletal: no muscle/joint aches Skin: no rashes, no hyperemia Neurological: no tremors, no numbness, no tingling, no dizziness Psychiatric: no depression, no anxiety, hx bipolar- currently controlled on meds  Objective:     BP 108/76 (BP Location: Left Arm, Patient Position: Sitting, Cuff Size: Large)   Pulse (!) 104   Ht 5' 3.5" (1.613 m)   Wt 191 lb 3.2 oz (86.7 kg)   BMI 33.34 kg/m   Wt Readings from Last 3 Encounters:  05/13/24 191 lb 3.2 oz (86.7 kg)  03/03/24 194 lb (88 kg)  11/05/23 194 lb (88 kg)    BP Readings from Last 3 Encounters:  05/13/24 108/76  03/03/24 125/84  11/05/23 106/72      Physical Exam- Limited  Constitutional:  Body mass index is 33.34 kg/m. , not in acute distress, normal state of mind Eyes:  EOMI, no exophthalmos Musculoskeletal: no gross deformities, strength intact in all four extremities, no gross restriction of joint movements Skin:  no rashes, no hyperemia Neurological: no tremor with outstretched hands   Diabetic Foot Exam - Simple   No data filed    CMP ( most recent) CMP     Component Value Date/Time   NA 137 11/04/2023 0954   K 5.5 (H) 11/04/2023 0954   CL 101 11/04/2023 0954   CO2 22 11/04/2023 0954   GLUCOSE 106 (H) 11/04/2023 0954   GLUCOSE 89 07/14/2014 0958   BUN 7 11/04/2023 0954   CREATININE 1.06 (H) 11/04/2023 0954   CREATININE 0.75 07/14/2014 0958   CALCIUM 9.6 11/04/2023 0954   PROT 6.6 11/04/2023 0954   ALBUMIN  4.2 11/04/2023 0954   AST 18 11/04/2023 0954   ALT 22 11/04/2023 0954   ALKPHOS 128 (H) 11/04/2023 0954   BILITOT <0.2 11/04/2023 0954   GFRNONAA 80 10/10/2020 0000     Diabetic Labs (most recent):  Lab Results  Component Value Date   HGBA1C 6.4 (A) 05/13/2024   HGBA1C 6.2 (A) 11/05/2023   HGBA1C 6.0 02/06/2023   MICROALBUR <0.2 02/06/2023   MICROALBUR 10 07/08/2022   MICROALBUR 10 01/24/2021     Lipid Panel     Component Value Date/Time   CHOL 137 02/06/2023 0000   CHOL 221 (H) 10/17/2021 1008   TRIG 95 02/06/2023 0000   HDL 55 02/06/2023 0000   HDL 39 (L) 10/17/2021 1008   CHOLHDL 5.7 (H) 10/17/2021 1008   LDLCALC 64 02/06/2023 0000   LDLCALC 124 (H) 10/17/2021 1008   LABVLDL 58 (H) 10/17/2021 1008     Latest Reference Range & Units 10/17/21 00:00 10/17/21 10:08 02/27/22 08:39 02/06/23 00:00 06/09/23 11:04 11/04/23 09:54  TSH 0.450 - 4.500 uIU/mL 0.58 (E) 0.575 1.670 1.82 1.400 1.380  T4,Free(Direct) 0.82 - 1.77 ng/dL  1.61 0.96  0.45 4.09  (E): External lab result  Assessment & Plan:   1) Type 2 diabetes mellitus without complication, without long-term current use of insulin (HCC)  - Analynn Daum has currently uncontrolled symptomatic type 2 DM since  48 years of age.  She presents today with no meter or logs to review, was not asked to routinely monitor given safe regimen with Mounjaro  only.  Her POCT A1c today is 6.4%, increasing slightly from last visit of 6.2%.  She denies any unpleasant side effects of this medication.  She does report she started smoking once again but not as much as she was last visit.  She expresses many stressors, boyfriends health is not good, neither is her dads.  - I had a long discussion with her about the progressive nature of diabetes and the pathology behind its complications. -her diabetes is complicated by obesity/sedentary life, chronic  Smoking, sleep apnea and she remains at a high risk for more acute and chronic complications  which include CAD, CVA, CKD, retinopathy, and neuropathy. These are all discussed in detail with her.  - Nutritional counseling repeated at each appointment due to patients tendency to fall back in to old habits.  - The patient admits there is a room for improvement in their diet and drink choices. -  Suggestion is made for the patient to avoid simple carbohydrates from their diet including Cakes, Sweet Desserts / Pastries, Ice Cream, Soda (diet and regular), Sweet Tea, Candies, Chips, Cookies, Sweet Pastries, Store Bought Juices, Alcohol in Excess of 1-2 drinks a day, Artificial Sweeteners, Coffee Creamer, and "Sugar-free" Products. This will help patient to have stable blood glucose profile and potentially avoid unintended weight gain.   - I encouraged the patient to switch to unprocessed or minimally processed complex starch and increased protein intake (animal or plant source), fruits, and vegetables.   - Patient is advised to stick to a routine mealtimes to eat 3 meals a day and avoid unnecessary snacks (to snack only to correct hypoglycemia).  - I have approached her with the following individualized plan to manage  her diabetes and patient agrees:   -She is advised to continue Mounjaro  10 mg SQ weekly.   I discussed the potential dangers of taking GLP1 product while smoking as it increases risk of pancreatitis.  I urged her to quit once again.  -She can take a break from routine glucose monitoring due to monotherapy with Mounjaro  only.  - Specific targets for  A1c;  LDL, HDL,  and Triglycerides were discussed with the patient.  2) Blood Pressure /Hypertension: Her blood pressure is  controlled to target.  She is not currently on any antihypertensive medications at this time.  If BP is elevated on 3 separate occasions, she will be considered for low dose ACE/ARB for renal protection from DM.  3) Lipids/Hyperlipidemia:  Her most recent lipid panel from 02/06/23 shows uncontrolled LDL of  64 (greatly improved).  She is advised to continue Crestor 20 mg po daily at bedtime and Fenofibrate  145 mg po daily.  Side effects and precautions discussed with her.  She is also advised to avoid fried foods and butter.    4)  Weight/Diet:  Her Body mass index is 33.34 kg/m.-   clearly complicating her diabetes care.   she is  a candidate for weight loss. I discussed with her the fact that loss of 5 - 10% of her  current body weight will have the most impact on her diabetes management.  Exercise, and detailed carbohydrates information provided  -  detailed on discharge instructions.    5) Hypothyroidism- unspecified There are no recent TFTs to review.  She is advised to continue her Levothyroxine  75 mcg po daily before breakfast.  She has labs coming up with her PCP.  I asked her to have a copy of any labs sent to use for review and record keeping.   - The correct intake of thyroid hormone (Levothyroxine , Synthroid ), is on empty stomach first thing in the morning, with water, separated by at least 30 minutes from breakfast and other medications,  and separated by more than 4 hours from calcium, iron, multivitamins, acid reflux medications (PPIs).  - This medication is a life-long medication and will be needed to correct thyroid hormone imbalances for the rest of your life.  The dose may change from time to time, based on thyroid blood work.  - It is extremely important to be consistent taking this medication, near the same time each morning.  -AVOID TAKING PRODUCTS CONTAINING BIOTIN (commonly found in Hair, Skin, Nails vitamins) AS IT INTERFERES WITH THE VALIDITY OF THYROID FUNCTION BLOOD TESTS.  6) Vitamin D  Deficiency Her most recent vitamin D  level was 30 on 13/8/23.  She is currently taking Ergocalciferol  50000 units weekly, she is advised to continue.    7) Chronic Care/Health Maintenance: -she is on Statin medications and  is encouraged to initiate and continue to follow up with  Ophthalmology, Dentist, Podiatrist at least yearly or according to recommendations, and advised to  Quit smoking. I have recommended yearly flu vaccine and pneumonia vaccine at least every 5 years; moderate intensity exercise for up to 150 minutes weekly; and  sleep for at least 7 hours a day.  Smoking cessation instruction/counseling given:  counseled patient on the dangers of tobacco use, advised patient to stop smoking, and reviewed strategies to maximize success   - she is advised to maintain close follow up with Strader, Lindsey F, NP for primary care needs, as well as her other providers for optimal and coordinated care.  I did enter referral to podiatry for DM foot care.     I spent  30  minutes in the care of the patient today including review of labs from CMP, Lipids, Thyroid Function, Hematology (current and previous including abstractions from other facilities); face-to-face time discussing  her blood glucose readings/logs, discussing hypoglycemia and hyperglycemia episodes and symptoms, medications doses, her options of short and long term treatment based on the latest standards of care / guidelines;  discussion about incorporating lifestyle medicine;  and documenting the encounter. Risk  reduction counseling performed per USPSTF guidelines to reduce obesity and cardiovascular risk factors.     Please refer to Patient Instructions for Blood Glucose Monitoring and Insulin/Medications Dosing Guide"  in media tab for additional information. Please  also refer to " Patient Self Inventory" in the Media  tab for reviewed elements of pertinent patient history.  Vesta Gourd participated in the discussions, expressed understanding, and voiced agreement with the above plans.  All questions were answered to her satisfaction. she is encouraged to contact clinic should she have any questions or concerns prior to her return visit.    Follow up plan: - Return in about 4 months (around 09/13/2024)  for Diabetes F/U with A1c in office, Thyroid follow up, No previsit labs.  Hulon Magic, [facility] [address] [place], [place] [address] Phone: [phone] Fax: [phone]  05/13/2024, 11:42 AM

## 2024-05-19 ENCOUNTER — Encounter: Payer: Self-pay | Admitting: Orthopaedic Surgery

## 2024-05-19 ENCOUNTER — Ambulatory Visit: Payer: MEDICAID | Admitting: Orthopaedic Surgery

## 2024-05-19 VITALS — BP 113/74 | HR 99 | Ht 63.5 in | Wt 191.0 lb

## 2024-05-19 DIAGNOSIS — G894 Chronic pain syndrome: Secondary | ICD-10-CM

## 2024-05-19 DIAGNOSIS — M542 Cervicalgia: Secondary | ICD-10-CM

## 2024-05-19 NOTE — Progress Notes (Signed)
 I still do not have notes from the pain clinic.  No charge today.  No treatment.  Encounter Diagnoses  Name Primary?   Chronic pain syndrome Yes   Neck pain    Return in one week.  Hopefully I will get the notes.  Electronically Signed Pleasant Brilliant, MD 5/28/20251:49 PM

## 2024-05-26 ENCOUNTER — Ambulatory Visit: Payer: MEDICAID | Admitting: Orthopaedic Surgery

## 2024-05-31 ENCOUNTER — Ambulatory Visit: Payer: MEDICAID | Admitting: Cardiology

## 2024-05-31 NOTE — Progress Notes (Deleted)
 Clinical Summary Catherine Howard is a 48 y.o.female seen as a new consult, referred by NP Strader for the following medical problems.  1.Chest pain - previously seen by [facility] cards 2024 - from notes discussed coronary CTA but elected not to pursue -    2.HTN   3. HLD   4. DM2  5. Tobacco abuse   Past Medical History:  Diagnosis Date   ADHD    Bipolar I disorder (HCC)    Elevated cholesterol    GERD (gastroesophageal reflux disease)    Thyroid disease    Type 2 diabetes mellitus (HCC)    Vitamin D  deficiency      Allergies  Allergen Reactions   Oxycodone-Acetaminophen Itching   Ozempic  (0.25 Or 0.5 Mg-Dose) [Semaglutide (0.25 Or 0.5mg -Dos)] Itching and Rash     Current Outpatient Medications  Medication Sig Dispense Refill   ACCU-CHEK AVIVA PLUS test strip SMARTSIG:Via Meter     Accu-Chek Softclix Lancets lancets SMARTSIG:Topical     ALPRAZolam  (XANAX ) 1 MG tablet Take 1 tablet (1 mg total) by mouth 3 (three) times daily. 90 tablet 2   amphetamine -dextroamphetamine  (ADDERALL) 20 MG tablet Take 1 tablet (20 mg total) by mouth 2 (two) times daily. 60 tablet 0   amphetamine -dextroamphetamine  (ADDERALL) 20 MG tablet Take 1 tablet (20 mg total) by mouth 2 (two) times daily. 60 tablet 0   amphetamine -dextroamphetamine  (ADDERALL) 20 MG tablet Take 1 tablet (20 mg total) by mouth 2 (two) times daily. 60 tablet 0   atorvastatin (LIPITOR) 40 MG tablet Take 40 mg by mouth daily.     Azelastine-Fluticasone 137-50 MCG/ACT SUSP Place 1 spray into the nose in the morning and at bedtime. One spray each nostril two times daily     Cariprazine  HCl (VRAYLAR ) 4.5 MG CAPS Take 1 capsule (4.5 mg total) by mouth daily. 30 capsule 2   diclofenac  (VOLTAREN ) 75 MG EC tablet Take 1 tablet (75 mg total) by mouth 2 (two) times daily with a meal. 60 tablet 2   DULoxetine  (CYMBALTA ) 60 MG capsule Take 1 capsule (60 mg total) by mouth 2 (two) times daily. 180 capsule 2   esomeprazole  (NEXIUM ) 40  MG capsule Take 1 capsule (40 mg total) by mouth 2 (two) times daily. Take 30- 60 min before your first and last meals of the day 180 capsule 3   fenofibrate  (TRICOR ) 145 MG tablet Take 1 tablet (145 mg total) by mouth daily. 90 tablet 3   ipratropium (ATROVENT  HFA) 17 MCG/ACT inhaler Inhale 2 puffs into the lungs every 6 (six) hours as needed for wheezing. 1 each 11   ipratropium (ATROVENT ) 0.03 % nasal spray USE TWO SPRAYS in each nostril TWICE DAILY x10 DAYS AS NEEDED     levocetirizine (XYZAL) 5 MG tablet 1 tablet in the evening     levothyroxine  (SYNTHROID ) 75 MCG tablet Take 1 tablet (75 mcg total) by mouth daily. 90 tablet 3   montelukast (SINGULAIR) 10 MG tablet SMARTSIG:1 Tablet(s) By Mouth Every Evening     naloxegol  oxalate (MOVANTIK ) 12.5 MG TABS tablet Take 1 tablet (12.5 mg total) by mouth daily. 30 tablet 1   NOVOFINE PEN NEEDLE 32G X 6 MM MISC USE AS DIRECTED 100 each 3   NYSTATIN powder Apply topically.     SUMAtriptan (IMITREX) 100 MG tablet Take by mouth.     Suvorexant  (BELSOMRA ) 20 MG TABS Take 1 tablet (20 mg total) by mouth at bedtime. 30 tablet 2   tirzepatide  (MOUNJARO )  10 MG/0.5ML Pen Inject 10 mg into the skin once a week. 6 mL 1   Vitamin D , Ergocalciferol , (DRISDOL ) 1.25 MG (50000 UNIT) CAPS capsule Take 1 capsule (50,000 Units total) by mouth every 7 (seven) days. 12 capsule 3   No current facility-administered medications for this visit.     Past Surgical History:  Procedure Laterality Date   CESAREAN SECTION     DILATION AND CURETTAGE OF UTERUS       Allergies  Allergen Reactions   Oxycodone-Acetaminophen Itching   Ozempic  (0.25 Or 0.5 Mg-Dose) [Semaglutide (0.25 Or 0.5mg -Dos)] Itching and Rash      Family History  Problem Relation Age of Onset   Schizophrenia Father    Alcohol abuse Father    Diabetes Father    Hyperlipidemia Father    Alcohol abuse Paternal Uncle      Social History Catherine Howard reports that she has been smoking cigarettes.  She has a 64 pack-year smoking history. She has never used smokeless tobacco. Catherine Howard reports no history of alcohol use.   Review of Systems CONSTITUTIONAL: No weight loss, fever, chills, weakness or fatigue.  HEENT: Eyes: No visual loss, blurred vision, double vision or yellow sclerae.No hearing loss, sneezing, congestion, runny nose or sore throat.  SKIN: No rash or itching.  CARDIOVASCULAR:  RESPIRATORY: No shortness of breath, cough or sputum.  GASTROINTESTINAL: No anorexia, nausea, vomiting or diarrhea. No abdominal pain or blood.  GENITOURINARY: No burning on urination, no polyuria NEUROLOGICAL: No headache, dizziness, syncope, paralysis, ataxia, numbness or tingling in the extremities. No change in bowel or bladder control.  MUSCULOSKELETAL: No muscle, back pain, joint pain or stiffness.  LYMPHATICS: No enlarged nodes. No history of splenectomy.  PSYCHIATRIC: No history of depression or anxiety.  ENDOCRINOLOGIC: No reports of sweating, cold or heat intolerance. No polyuria or polydipsia.  Catherine Howard   Physical Examination There were no vitals filed for this visit. There were no vitals filed for this visit.  Gen: resting comfortably, no acute distress HEENT: no scleral icterus, pupils equal round and reactive, no palptable cervical adenopathy,  CV Resp: Clear to auscultation bilaterally GI: abdomen is soft, non-tender, non-distended, normal bowel sounds, no hepatosplenomegaly MSK: extremities are warm, no edema.  Skin: warm, no rash Neuro:  no focal deficits Psych: appropriate affect   Diagnostic Studies     Assessment and Plan        Laurann Pollock, M.D., F.A.C.C.

## 2024-06-02 ENCOUNTER — Encounter: Payer: Self-pay | Admitting: Orthopaedic Surgery

## 2024-06-02 ENCOUNTER — Telehealth (HOSPITAL_COMMUNITY): Payer: Self-pay

## 2024-06-02 ENCOUNTER — Ambulatory Visit: Payer: MEDICAID | Admitting: Orthopaedic Surgery

## 2024-06-02 DIAGNOSIS — M542 Cervicalgia: Secondary | ICD-10-CM

## 2024-06-02 NOTE — Progress Notes (Signed)
 I still hurt.  She has a long history of neck and lower back pain.  She is seen today for the neck pain.  She has been in a pain clinic last seen in November.  I have notes from [facility].  I have reviewed the notes.  She is currently on Xanax , Adderall, Cymbalta .  She had been on Vicodin 10 for pain control which helped some.  She has had neck ablation times two by Dr. Adin Honour.  He has retired.  She had previously seen Dr. Joleen Navy, neurologist, but he has retired also.  She has had MRI ordered of the neck in November, 2024 and March, 2025 which were both denied by her insurance company.  She has continued neck pain, weakness of arms.  She has no new trauma.  She is not on any narcotic.    She has painful ROM of the neck more to the right.  Her nuchal muscles are tender and tight on the right side.  She has weakness of the right arm triceps 4 of 5 with slightly decreased reflex on that side.  Grips are weaker on the right side.    ROM of the shoulders is good.  Encounter Diagnosis  Name Primary?   Cervicalgia Yes   I feel she needs MRI of the cervical spine.  It should have been done earlier.  I will order it.  I will hold off on pain medicine until MRI is done.  Return in three weeks.  Call if any problem.  Precautions discussed.  Electronically Signed Pleasant Brilliant, MD 6/11/202510:13 AM

## 2024-06-02 NOTE — Telephone Encounter (Signed)
 Medication management - Prior authorization submitted online with CoverMyMeds and sent to patient's [employer] plan for review and pending decision.

## 2024-06-02 NOTE — Patient Instructions (Signed)
 Follow up in 3 weeks with Dr. Iline Mallory after MRI.

## 2024-06-04 NOTE — Telephone Encounter (Signed)
 PA # [id number] for pt's Belsomra  20 MG Tab approved from 06/03/24 To 12/03/24

## 2024-06-13 ENCOUNTER — Ambulatory Visit (HOSPITAL_COMMUNITY)
Admission: RE | Admit: 2024-06-13 | Discharge: 2024-06-13 | Disposition: A | Discharge status: Home / Self Care | Payer: MEDICAID | Source: Ambulatory Visit | Attending: Orthopaedic Surgery | Admitting: Orthopaedic Surgery

## 2024-06-13 DIAGNOSIS — M542 Cervicalgia: Secondary | ICD-10-CM | POA: Diagnosis present

## 2024-06-21 ENCOUNTER — Encounter (HOSPITAL_COMMUNITY): Payer: Self-pay | Admitting: Psychiatry

## 2024-06-21 ENCOUNTER — Telehealth (HOSPITAL_COMMUNITY): Payer: Self-pay | Admitting: *Deleted

## 2024-06-21 ENCOUNTER — Telehealth (INDEPENDENT_AMBULATORY_CARE_PROVIDER_SITE_OTHER): Payer: MEDICAID | Admitting: Psychiatry

## 2024-06-21 DIAGNOSIS — F3162 Bipolar disorder, current episode mixed, moderate: Secondary | ICD-10-CM | POA: Diagnosis not present

## 2024-06-21 DIAGNOSIS — F902 Attention-deficit hyperactivity disorder, combined type: Secondary | ICD-10-CM

## 2024-06-21 MED ORDER — BELSOMRA 20 MG PO TABS: 20.0000 mg | ORAL_TABLET | Freq: Every day | ORAL | 2 refills | Status: DC

## 2024-06-21 MED ORDER — DULOXETINE HCL 60 MG PO CPEP: 60.0000 mg | ORAL_CAPSULE | Freq: Two times a day (BID) | ORAL | 2 refills | Status: DC

## 2024-06-21 MED ORDER — VRAYLAR 4.5 MG PO CAPS: 1.0000 | ORAL_CAPSULE | Freq: Every day | ORAL | 2 refills | Status: DC

## 2024-06-21 MED ORDER — LITHIUM CARBONATE ER 450 MG PO TBCR: 450.0000 mg | EXTENDED_RELEASE_TABLET | Freq: Two times a day (BID) | ORAL | 2 refills | Status: DC

## 2024-06-21 MED ORDER — CLONAZEPAM 1 MG PO TABS: 1.0000 mg | ORAL_TABLET | Freq: Three times a day (TID) | ORAL | 2 refills | Status: DC

## 2024-06-21 NOTE — Telephone Encounter (Signed)
 Patient called wanting provider to please call her. Per patient she have a lot going on and been crying and her fiance passed away on the 2024-07-15 of this month. Per pt her depression has been worse. Per pt she do not have SI or HI. She just wants provider to please give her a call at [phone].

## 2024-06-21 NOTE — Progress Notes (Signed)
 Virtual Visit via Video Note  I connected with Catherine Howard on 06/21/24 at  2:40 PM EDT by a video enabled telemedicine application and verified that I am speaking with the correct person using two identifiers.  Location: Patient: home Provider: office   I discussed the limitations of evaluation and management by telemedicine and the availability of in person appointments. The patient expressed understanding and agreed to proceed.      I discussed the assessment and treatment plan with the patient. The patient was provided an opportunity to ask questions and all were answered. The patient agreed with the plan and demonstrated an understanding of the instructions.   The patient was advised to call back or seek an in-person evaluation if the symptoms worsen or if the condition fails to improve as anticipated.  I provided 20 minutes of non-face-to-face time during this encounter.   Barnie Gull, MD  [facility] MD/PA/NP OP Progress Note  06/21/2024 2:57 PM Catherine Howard  MRN:  [id number]  Chief Complaint:  Chief Complaint  Patient presents with   Anxiety   Depression   Follow-up   HPI: This patient is a 48 year old divorced white female who lives with her fianc and son in [place]. She had a 82 year old son died of a drug overdose in 06/29/2013. The patient does not work and is on disability.   The patient returns for follow-up as a work in today.  She has been followed for her bipolar disorder anxiety and ADHD.  However she states that her fianc died of heart failure earlier this month.  He was 50 years old.  They have been together about 14 years.  She states that he gave up on his life because he was tired of fighting.  She has been very sad and anxious.  She has not yet picked up the Belsomra  for sleep so consequently she is not sleeping well.  Her son is living with her as well as a friend so she is not alone.  She denies any thoughts of self-harm or suicide.  She has been tearful and sad  and I think this is normal after losing someone so close.  She asked if she can retry the clonazepam  as the Xanax  does not seem to be helping and I think this is reasonable. Visit Diagnosis:    ICD-10-CM   1. Bipolar 1 disorder, mixed, moderate (HCC)  F31.62     2. Attention deficit hyperactivity disorder (ADHD), combined type  F90.2       Past Psychiatric History: Previous outpatient treatment  Past Medical History:  Past Medical History:  Diagnosis Date   ADHD    Bipolar I disorder (HCC)    Elevated cholesterol    GERD (gastroesophageal reflux disease)    Thyroid disease    Type 2 diabetes mellitus (HCC)    Vitamin D  deficiency     Past Surgical History:  Procedure Laterality Date   CESAREAN SECTION     DILATION AND CURETTAGE OF UTERUS      Family Psychiatric History: See below  Family History:  Family History  Problem Relation Age of Onset   Schizophrenia Father    Alcohol abuse Father    Diabetes Father    Hyperlipidemia Father    Alcohol abuse Paternal Uncle     Social History:  Social History   Socioeconomic History   Marital status: Single    Spouse name: Not on file   Number of children: Not on file   Years of education:  Not on file   Highest education level: Not on file  Occupational History   Not on file  Tobacco Use   Smoking status: Every Day    Current packs/day: 2.00    Average packs/day: 2.0 packs/day for 32.0 years (64.0 ttl pk-yrs)    Types: Cigarettes   Smokeless tobacco: Never   Tobacco comments:    smokes 3/4 of a pack a day--01/22/2021  Vaping Use   Vaping status: Former  Substance and Sexual Activity   Alcohol use: No    Alcohol/week: 0.0 standard drinks of alcohol   Drug use: No   Sexual activity: Yes    Partners: Male    Birth control/protection: None  Other Topics Concern   Not on file  Social History Narrative   Not on file   Social Drivers of Health   Financial Resource Strain: Not on file  Food Insecurity: Not on  file  Transportation Needs: Not on file  Physical Activity: Not on file  Stress: Not on file  Social Connections: Not on file    Allergies:  Allergies  Allergen Reactions   Oxycodone-Acetaminophen Itching   Ozempic  (0.25 Or 0.5 Mg-Dose) [Semaglutide (0.25 Or 0.5mg -Dos)] Itching and Rash    Metabolic Disorder Labs: Lab Results  Component Value Date   HGBA1C 6.4 (A) 05/13/2024   No results found for: PROLACTIN Lab Results  Component Value Date   CHOL 137 02/06/2023   TRIG 95 02/06/2023   HDL 55 02/06/2023   CHOLHDL 5.7 (H) 10/17/2021   LDLCALC 64 02/06/2023   LDLCALC 124 (H) 10/17/2021   Lab Results  Component Value Date   TSH 1.380 11/04/2023   TSH 1.400 06/09/2023    Therapeutic Level Labs: Lab Results  Component Value Date   LITHIUM  0.40 (L) 07/14/2014   No results found for: VALPROATE No results found for: CBMZ  Current Medications: Current Outpatient Medications  Medication Sig Dispense Refill   ACCU-CHEK AVIVA PLUS test strip SMARTSIG:Via Meter     Accu-Chek Softclix Lancets lancets SMARTSIG:Topical     amphetamine -dextroamphetamine  (ADDERALL) 20 MG tablet Take 1 tablet (20 mg total) by mouth 2 (two) times daily. 60 tablet 0   amphetamine -dextroamphetamine  (ADDERALL) 20 MG tablet Take 1 tablet (20 mg total) by mouth 2 (two) times daily. 60 tablet 0   amphetamine -dextroamphetamine  (ADDERALL) 20 MG tablet Take 1 tablet (20 mg total) by mouth 2 (two) times daily. 60 tablet 0   atorvastatin (LIPITOR) 40 MG tablet Take 40 mg by mouth daily.     Azelastine-Fluticasone 137-50 MCG/ACT SUSP Place 1 spray into the nose in the morning and at bedtime. One spray each nostril two times daily     Cariprazine  HCl (VRAYLAR ) 4.5 MG CAPS Take 1 capsule (4.5 mg total) by mouth daily. 30 capsule 2   diclofenac  (VOLTAREN ) 75 MG EC tablet Take 1 tablet (75 mg total) by mouth 2 (two) times daily with a meal. 60 tablet 2   DULoxetine  (CYMBALTA ) 60 MG capsule Take 1 capsule (60  mg total) by mouth 2 (two) times daily. 180 capsule 2   esomeprazole  (NEXIUM ) 40 MG capsule Take 1 capsule (40 mg total) by mouth 2 (two) times daily. Take 30- 60 min before your first and last meals of the day 180 capsule 3   fenofibrate  (TRICOR ) 145 MG tablet Take 1 tablet (145 mg total) by mouth daily. 90 tablet 3   ipratropium (ATROVENT  HFA) 17 MCG/ACT inhaler Inhale 2 puffs into the lungs every 6 (six) hours as needed  for wheezing. 1 each 11   ipratropium (ATROVENT ) 0.03 % nasal spray USE TWO SPRAYS in each nostril TWICE DAILY x10 DAYS AS NEEDED     levocetirizine (XYZAL) 5 MG tablet 1 tablet in the evening     levothyroxine  (SYNTHROID ) 75 MCG tablet Take 1 tablet (75 mcg total) by mouth daily. 90 tablet 3   montelukast (SINGULAIR) 10 MG tablet SMARTSIG:1 Tablet(s) By Mouth Every Evening     naloxegol  oxalate (MOVANTIK ) 12.5 MG TABS tablet Take 1 tablet (12.5 mg total) by mouth daily. 30 tablet 1   NOVOFINE PEN NEEDLE 32G X 6 MM MISC USE AS DIRECTED 100 each 3   NYSTATIN powder Apply topically.     SUMAtriptan (IMITREX) 100 MG tablet Take by mouth.     Suvorexant  (BELSOMRA ) 20 MG TABS Take 1 tablet (20 mg total) by mouth at bedtime. 30 tablet 2   tirzepatide  (MOUNJARO ) 10 MG/0.5ML Pen Inject 10 mg into the skin once a week. 6 mL 1   Vitamin D , Ergocalciferol , (DRISDOL ) 1.25 MG (50000 UNIT) CAPS capsule Take 1 capsule (50,000 Units total) by mouth every 7 (seven) days. 12 capsule 3   No current facility-administered medications for this visit.     Musculoskeletal: Strength & Muscle Tone: within normal limits Gait & Station: normal Patient leans: N/A  Psychiatric Specialty Exam: Review of Systems  Psychiatric/Behavioral:  Positive for dysphoric mood and sleep disturbance.   All other systems reviewed and are negative.   There were no vitals taken for this visit.There is no height or weight on file to calculate BMI.  General Appearance: Casual and Fairly Groomed  Eye Contact:  Good   Speech:  Clear and Coherent  Volume:  Normal  Mood:  Anxious and Depressed  Affect:  Flat  Thought Process:  Goal Directed  Orientation:  Full (Time, Place, and Person)  Thought Content: Rumination   Suicidal Thoughts:  No  Homicidal Thoughts:  No  Memory:  Immediate;   Good Recent;   Good Remote;   NA  Judgement:  Good  Insight:  Fair  Psychomotor Activity:  Decreased  Concentration:  Concentration: Good and Attention Span: Good  Recall:  Fair  Fund of Knowledge: Fair  Language: Good  Akathisia:  No  Handed:  Right  AIMS (if indicated): not done  Assets:  Communication Skills Desire for Improvement Resilience Social Support Talents/Skills  ADL's:  Intact  Cognition: WNL  Sleep:  Poor   Screenings: GAD-7    Flowsheet Row Office Visit from 03/13/2023 in [facility] [place]  Total GAD-7 Score 14   PHQ2-9    Flowsheet Row Office Visit from 03/13/2023 in [facility] [place] Video Visit from 09/26/2022 in [facility] [place] Video Visit from 08/29/2022 in [facility] [place] Video Visit from 07/04/2022 in [facility] [place] Video Visit from 06/05/2022 in [facility] Total Score 4 0 1 1 1   PHQ-9 Total Score 12 -- -- -- --   Flowsheet Row Office Visit from 03/13/2023 in [facility] [place] Video Visit from 09/26/2022 in [facility] [place] Video Visit from 08/29/2022 in [facility] at [place]  C-SSRS RISK CATEGORY No Risk No Risk No Risk     Assessment and Plan: This patient is a 48 year old female with a history bipolar disorder  anxiety and ADD.  She has been more anxious and depressed this month since her fianc passed away.  She will substitute clonazepam  1 mg  3 times daily for Xanax .  She will pick up the Belsomra  20 mg at bedtime that was prescribed for sleep.  She will continue Adderall 20 mg twice daily for ADD, Vraylar  4.5 mg daily for mood stabilization and Cymbalta  60 mg twice daily for depression.  She will return to see me in 4 weeks  Collaboration of Care: Collaboration of Care: Primary Care Provider AEB notes to be shared with PCP at patient's request  Patient/Guardian was advised Release of Information must be obtained prior to any record release in order to collaborate their care with an outside provider. Patient/Guardian was advised if they have not already done so to contact the registration department to sign all necessary forms in order for us  to release information regarding their care.   Consent: Patient/Guardian gives verbal consent for treatment and assignment of benefits for services provided during this visit. Patient/Guardian expressed understanding and agreed to proceed.    Barnie Gull, MD 06/21/2024, 2:57 PM

## 2024-06-21 NOTE — Telephone Encounter (Signed)
 Please set this up as an appointment for 2:40

## 2024-06-22 ENCOUNTER — Telehealth (HOSPITAL_COMMUNITY): Payer: MEDICAID | Admitting: Psychiatry

## 2024-06-23 ENCOUNTER — Ambulatory Visit: Payer: MEDICAID | Admitting: Orthopaedic Surgery

## 2024-06-23 ENCOUNTER — Encounter: Payer: Self-pay | Admitting: Orthopaedic Surgery

## 2024-06-23 DIAGNOSIS — M542 Cervicalgia: Secondary | ICD-10-CM

## 2024-06-23 NOTE — Progress Notes (Signed)
 My neck still hurts.  She had MRI of the cervical spine showing: IMPRESSION: 1. Multilevel cervical spondylosis with resultant mild spinal stenosis at C4-5 through C6-7. 2. Multifactorial degenerative changes with resultant multilevel foraminal narrowing as above. Notable findings include severe right worse than left C5 foraminal stenosis, severe left with moderate right C6 foraminal narrowing, with moderate bilateral C7 foraminal stenosis.  I have explained the findings to her.  I would like her to see neurosurgeon.  She is agreeable.  She has had ablation to the neck area times two by Dr. Jani.  She has been to pain clinic in [place].  She has painful ROM of the neck.  NV intact today.+  I have independently reviewed the MRI.      Encounter Diagnoses  Name Primary?   Cervicalgia Yes   Neck pain    To neurosurgery.  Continue present medicine given by other providers.  Call if any problem.  Precautions discussed.  Electronically Signed Lemond Stable, MD 7/2/20253:21 PM

## 2024-06-23 NOTE — Patient Instructions (Signed)
 You have been referred to [facility] on [facility] in [place], they will call you with appointment. If you have not heard anything in one week call them to schedule [phone]

## 2024-07-19 ENCOUNTER — Telehealth (INDEPENDENT_AMBULATORY_CARE_PROVIDER_SITE_OTHER): Payer: MEDICAID | Admitting: Psychiatry

## 2024-07-19 ENCOUNTER — Encounter (HOSPITAL_COMMUNITY): Payer: Self-pay | Admitting: Psychiatry

## 2024-07-19 DIAGNOSIS — F902 Attention-deficit hyperactivity disorder, combined type: Secondary | ICD-10-CM | POA: Diagnosis not present

## 2024-07-19 DIAGNOSIS — F3162 Bipolar disorder, current episode mixed, moderate: Secondary | ICD-10-CM

## 2024-07-19 MED ORDER — DULOXETINE HCL 60 MG PO CPEP: 60.0000 mg | ORAL_CAPSULE | Freq: Two times a day (BID) | ORAL | 2 refills | Status: DC

## 2024-07-19 MED ORDER — VRAYLAR 4.5 MG PO CAPS: 1.0000 | ORAL_CAPSULE | Freq: Every day | ORAL | 2 refills | Status: DC

## 2024-07-19 MED ORDER — METHYLPHENIDATE HCL 20 MG PO TABS: 20.0000 mg | ORAL_TABLET | Freq: Two times a day (BID) | ORAL | 0 refills | Status: DC

## 2024-07-19 MED ORDER — CLONAZEPAM 1 MG PO TABS: 1.0000 mg | ORAL_TABLET | Freq: Three times a day (TID) | ORAL | 2 refills | Status: DC

## 2024-07-19 MED ORDER — MIRTAZAPINE 30 MG PO TABS: 30.0000 mg | ORAL_TABLET | Freq: Every day | ORAL | 2 refills | Status: DC

## 2024-07-19 NOTE — Progress Notes (Signed)
 Virtual Visit via Video Note  I connected with Catherine Howard on 07/19/24 at  1:20 PM EDT by a video enabled telemedicine application and verified that I am speaking with the correct person using two identifiers.  Location: Patient: home Provider: office   I discussed the limitations of evaluation and management by telemedicine and the availability of in person appointments. The patient expressed understanding and agreed to proceed.      I discussed the assessment and treatment plan with the patient. The patient was provided an opportunity to ask questions and all were answered. The patient agreed with the plan and demonstrated an understanding of the instructions.   The patient was advised to call back or seek an in-person evaluation if the symptoms worsen or if the condition fails to improve as anticipated.  I provided 20 minutes of non-face-to-face time during this encounter.   Barnie Gull, MD  [facility] MD/PA/NP OP Progress Note  07/19/2024 1:43 PM Catherine Howard  MRN:  [id number]  Chief Complaint:  Chief Complaint  Patient presents with   Depression   Anxiety   Follow-up   HPI: This patient is a 48 year old divorced white female who lives with her  son in [place]. She had a 36 year old son died of a drug overdose in 08/03/2013. The patient does not work and is on disability   The patient returns for follow-up after 4 weeks.  Last time she told me that her fianc died of heart failure in early June.  They have been together for 14 years.  She states that his sons had a memorial service for him at [place] which they did not invite her to it which was very hurtful.  She still seems very distraught.  I added Belsomra  to her regimen but she is still not sleeping.  She asked about trying mirtazapine  which I think is reasonable.  She also states that her thoughts are all over the place and the Adderall is not helping and would like to go back to methylphenidate  that she tried in the  distant past I again think this is reasonable.  She is trying to spend a little bit of time with her family and not stay to isolated.  She denies any thoughts of self-harm or suicide Visit Diagnosis:    ICD-10-CM   1. Bipolar 1 disorder, mixed, moderate (HCC)  F31.62     2. Attention deficit hyperactivity disorder (ADHD), combined type  F90.2       Past Psychiatric History: Previous outpatient treatment  Past Medical History:  Past Medical History:  Diagnosis Date   ADHD    Bipolar I disorder (HCC)    Elevated cholesterol    GERD (gastroesophageal reflux disease)    Thyroid disease    Type 2 diabetes mellitus (HCC)    Vitamin D  deficiency     Past Surgical History:  Procedure Laterality Date   CESAREAN SECTION     DILATION AND CURETTAGE OF UTERUS      Family Psychiatric History: See below  Family History:  Family History  Problem Relation Age of Onset   Schizophrenia Father    Alcohol abuse Father    Diabetes Father    Hyperlipidemia Father    Alcohol abuse Paternal Uncle     Social History:  Social History   Socioeconomic History   Marital status: Single    Spouse name: Not on file   Number of children: Not on file   Years of education: Not on file  Highest education level: Not on file  Occupational History   Not on file  Tobacco Use   Smoking status: Every Day    Current packs/day: 2.00    Average packs/day: 2.0 packs/day for 32.0 years (64.0 ttl pk-yrs)    Types: Cigarettes   Smokeless tobacco: Never   Tobacco comments:    smokes 3/4 of a pack a day--01/22/2021  Vaping Use   Vaping status: Former  Substance and Sexual Activity   Alcohol use: No    Alcohol/week: 0.0 standard drinks of alcohol   Drug use: No   Sexual activity: Yes    Partners: Male    Birth control/protection: None  Other Topics Concern   Not on file  Social History Narrative   Not on file   Social Drivers of Health   Financial Resource Strain: Not on file  Food Insecurity:  Not on file  Transportation Needs: Not on file  Physical Activity: Not on file  Stress: Not on file  Social Connections: Not on file    Allergies:  Allergies  Allergen Reactions   Oxycodone-Acetaminophen Itching   Ozempic  (0.25 Or 0.5 Mg-Dose) [Semaglutide (0.25 Or 0.5mg -Dos)] Itching and Rash    Metabolic Disorder Labs: Lab Results  Component Value Date   HGBA1C 6.4 (A) 05/13/2024   No results found for: PROLACTIN Lab Results  Component Value Date   CHOL 137 02/06/2023   TRIG 95 02/06/2023   HDL 55 02/06/2023   CHOLHDL 5.7 (H) 10/17/2021   LDLCALC 64 02/06/2023   LDLCALC 124 (H) 10/17/2021   Lab Results  Component Value Date   TSH 1.380 11/04/2023   TSH 1.400 06/09/2023    Therapeutic Level Labs: Lab Results  Component Value Date   LITHIUM  0.40 (L) 07/14/2014   No results found for: VALPROATE No results found for: CBMZ  Current Medications: Current Outpatient Medications  Medication Sig Dispense Refill   mirtazapine  (REMERON ) 30 MG tablet Take 1 tablet (30 mg total) by mouth at bedtime. 30 tablet 2   ACCU-CHEK AVIVA PLUS test strip SMARTSIG:Via Meter     Accu-Chek Softclix Lancets lancets SMARTSIG:Topical     amphetamine -dextroamphetamine  (ADDERALL) 20 MG tablet Take 1 tablet (20 mg total) by mouth 2 (two) times daily. 60 tablet 0   amphetamine -dextroamphetamine  (ADDERALL) 20 MG tablet Take 1 tablet (20 mg total) by mouth 2 (two) times daily. 60 tablet 0   amphetamine -dextroamphetamine  (ADDERALL) 20 MG tablet Take 1 tablet (20 mg total) by mouth 2 (two) times daily. 60 tablet 0   atorvastatin (LIPITOR) 40 MG tablet Take 40 mg by mouth daily.     Azelastine-Fluticasone 137-50 MCG/ACT SUSP Place 1 spray into the nose in the morning and at bedtime. One spray each nostril two times daily     Cariprazine  HCl (VRAYLAR ) 4.5 MG CAPS Take 1 capsule (4.5 mg total) by mouth daily. 30 capsule 2   clonazePAM  (KLONOPIN ) 1 MG tablet Take 1 tablet (1 mg total) by mouth 3  (three) times daily. 90 tablet 2   diclofenac  (VOLTAREN ) 75 MG EC tablet Take 1 tablet (75 mg total) by mouth 2 (two) times daily with a meal. 60 tablet 2   DULoxetine  (CYMBALTA ) 60 MG capsule Take 1 capsule (60 mg total) by mouth 2 (two) times daily. 180 capsule 2   esomeprazole  (NEXIUM ) 40 MG capsule Take 1 capsule (40 mg total) by mouth 2 (two) times daily. Take 30- 60 min before your first and last meals of the day 180 capsule 3  fenofibrate  (TRICOR ) 145 MG tablet Take 1 tablet (145 mg total) by mouth daily. 90 tablet 3   ipratropium (ATROVENT  HFA) 17 MCG/ACT inhaler Inhale 2 puffs into the lungs every 6 (six) hours as needed for wheezing. 1 each 11   ipratropium (ATROVENT ) 0.03 % nasal spray USE TWO SPRAYS in each nostril TWICE DAILY x10 DAYS AS NEEDED     levocetirizine (XYZAL) 5 MG tablet 1 tablet in the evening     levothyroxine  (SYNTHROID ) 75 MCG tablet Take 1 tablet (75 mcg total) by mouth daily. 90 tablet 3   methylphenidate  (RITALIN ) 20 MG tablet Take 1 tablet (20 mg total) by mouth 2 (two) times daily with breakfast and lunch. 60 tablet 0   montelukast (SINGULAIR) 10 MG tablet SMARTSIG:1 Tablet(s) By Mouth Every Evening     naloxegol  oxalate (MOVANTIK ) 12.5 MG TABS tablet Take 1 tablet (12.5 mg total) by mouth daily. 30 tablet 1   NOVOFINE PEN NEEDLE 32G X 6 MM MISC USE AS DIRECTED 100 each 3   NYSTATIN powder Apply topically.     SUMAtriptan (IMITREX) 100 MG tablet Take by mouth.     tirzepatide  (MOUNJARO ) 10 MG/0.5ML Pen Inject 10 mg into the skin once a week. 6 mL 1   Vitamin D , Ergocalciferol , (DRISDOL ) 1.25 MG (50000 UNIT) CAPS capsule Take 1 capsule (50,000 Units total) by mouth every 7 (seven) days. 12 capsule 3   No current facility-administered medications for this visit.     Musculoskeletal: Strength & Muscle Tone: within normal limits Gait & Station: normal Patient leans: N/A  Psychiatric Specialty Exam: Review of Systems  Psychiatric/Behavioral:  Positive for  decreased concentration and sleep disturbance.   All other systems reviewed and are negative.   There were no vitals taken for this visit.There is no height or weight on file to calculate BMI.  General Appearance: A bit more disheveled than usual  Eye Contact:  Good  Speech:  Clear and Coherent  Volume:  Normal  Mood:  Dysphoric  Affect:  Flat  Thought Process:  Goal Directed  Orientation:  Full (Time, Place, and Person)  Thought Content: Rumination   Suicidal Thoughts:  No  Homicidal Thoughts:  No  Memory:  Immediate;   Good Recent;   Good Remote;   NA  Judgement:  Good  Insight:  Fair  Psychomotor Activity:  Decreased  Concentration:  Concentration: Poor and Attention Span: Poor  Recall:  Fair  Fund of Knowledge: Fair  Language: Good  Akathisia:  No  Handed:  Right  AIMS (if indicated): not done  Assets:  Communication Skills Desire for Improvement Resilience Social Support Talents/Skills  ADL's:  Intact  Cognition: WNL  Sleep:  Poor   Screenings: GAD-7    Flowsheet Row Office Visit from 03/13/2023 in [facility] [place]  Total GAD-7 Score 14   PHQ2-9    Flowsheet Row Office Visit from 03/13/2023 in [facility] [place] Video Visit from 09/26/2022 in [facility] at [place] Video Visit from 08/29/2022 in [facility] at [place] Video Visit from 07/04/2022 in [facility] at [place] Video Visit from 06/05/2022 in [facility] at [facility] Total Score 4 0 1 1 1   PHQ-9 Total Score 12 -- -- -- --   Flowsheet Row Office Visit from 03/13/2023 in [facility] at [place] Video Visit from 09/26/2022 in [facility] at [facility] Video Visit from 08/29/2022 in [facility] at [place]  C-SSRS RISK  CATEGORY No Risk No Risk No Risk     Assessment and Plan: This patient is a 48 year old female with a history of bipolar disorder anxiety and ADD.  Understandably she has been more depressed and anxious since her fianc passed away and she is also having trouble sleeping.  We will discontinue Belsomra  in favor of mirtazapine  30 mg at bedtime for anxiety and sleep.  She will also substitute methylphenidate  20 mg twice daily for ADD instead of Adderall.  She will continue clonazepam  1 mg 3 times daily for anxiety, Vraylar  4.5 mg daily for mood stabilization and Cymbalta  60 mg twice daily for depression.  She will return to see me in 4 weeks  Collaboration of Care: Collaboration of Care: Primary Care Provider AEB notes will be shared with PCP at patient's request  Patient/Guardian was advised Release of Information must be obtained prior to any record release in order to collaborate their care with an outside provider. Patient/Guardian was advised if they have not already done so to contact the registration department to sign all necessary forms in order for us  to release information regarding their care.   Consent: Patient/Guardian gives verbal consent for treatment and assignment of benefits for services provided during this visit. Patient/Guardian expressed understanding and agreed to proceed.    Barnie Gull, MD 07/19/2024, 1:43 PM

## 2024-08-10 ENCOUNTER — Telehealth (HOSPITAL_COMMUNITY): Payer: MEDICAID | Admitting: Psychiatry

## 2024-08-13 ENCOUNTER — Encounter: Payer: Self-pay | Admitting: Radiology

## 2024-08-24 ENCOUNTER — Telehealth (HOSPITAL_COMMUNITY): Payer: MEDICAID | Admitting: Psychiatry

## 2024-08-30 ENCOUNTER — Ambulatory Visit: Payer: MEDICAID | Attending: Cardiology | Admitting: Cardiology

## 2024-08-30 NOTE — Progress Notes (Deleted)
 Clinical Summary Ms. Basham is a 48 y.o.female seen today as a new consult, referred by NP Strader for the following medical problems  Chest pain - previously evaluated by [facility] cardiology 2024 for ches tpain - from notes she declined coronary CTA at the time - does have chronic neck and back pain, followed in pain clinic   2.HTN   3. HLD  4.OSA   Past Medical History:  Diagnosis Date   ADHD    Bipolar I disorder (HCC)    Elevated cholesterol    GERD (gastroesophageal reflux disease)    Thyroid disease    Type 2 diabetes mellitus (HCC)    Vitamin D  deficiency      Allergies  Allergen Reactions   Oxycodone-Acetaminophen Itching   Ozempic  (0.25 Or 0.5 Mg-Dose) [Semaglutide (0.25 Or 0.5mg -Dos)] Itching and Rash     Current Outpatient Medications  Medication Sig Dispense Refill   ACCU-CHEK AVIVA PLUS test strip SMARTSIG:Via Meter     Accu-Chek Softclix Lancets lancets SMARTSIG:Topical     amphetamine -dextroamphetamine  (ADDERALL) 20 MG tablet Take 1 tablet (20 mg total) by mouth 2 (two) times daily. 60 tablet 0   amphetamine -dextroamphetamine  (ADDERALL) 20 MG tablet Take 1 tablet (20 mg total) by mouth 2 (two) times daily. 60 tablet 0   amphetamine -dextroamphetamine  (ADDERALL) 20 MG tablet Take 1 tablet (20 mg total) by mouth 2 (two) times daily. 60 tablet 0   atorvastatin (LIPITOR) 40 MG tablet Take 40 mg by mouth daily.     Azelastine-Fluticasone 137-50 MCG/ACT SUSP Place 1 spray into the nose in the morning and at bedtime. One spray each nostril two times daily     Cariprazine  HCl (VRAYLAR ) 4.5 MG CAPS Take 1 capsule (4.5 mg total) by mouth daily. 30 capsule 2   clonazePAM  (KLONOPIN ) 1 MG tablet Take 1 tablet (1 mg total) by mouth 3 (three) times daily. 90 tablet 2   diclofenac  (VOLTAREN ) 75 MG EC tablet Take 1 tablet (75 mg total) by mouth 2 (two) times daily with a meal. 60 tablet 2   DULoxetine  (CYMBALTA ) 60 MG capsule Take 1 capsule (60 mg total) by mouth 2  (two) times daily. 180 capsule 2   esomeprazole  (NEXIUM ) 40 MG capsule Take 1 capsule (40 mg total) by mouth 2 (two) times daily. Take 30- 60 min before your first and last meals of the day 180 capsule 3   fenofibrate  (TRICOR ) 145 MG tablet Take 1 tablet (145 mg total) by mouth daily. 90 tablet 3   ipratropium (ATROVENT  HFA) 17 MCG/ACT inhaler Inhale 2 puffs into the lungs every 6 (six) hours as needed for wheezing. 1 each 11   ipratropium (ATROVENT ) 0.03 % nasal spray USE TWO SPRAYS in each nostril TWICE DAILY x10 DAYS AS NEEDED     levocetirizine (XYZAL) 5 MG tablet 1 tablet in the evening     levothyroxine  (SYNTHROID ) 75 MCG tablet Take 1 tablet (75 mcg total) by mouth daily. 90 tablet 3   methylphenidate  (RITALIN ) 20 MG tablet Take 1 tablet (20 mg total) by mouth 2 (two) times daily with breakfast and lunch. 60 tablet 0   mirtazapine  (REMERON ) 30 MG tablet Take 1 tablet (30 mg total) by mouth at bedtime. 30 tablet 2   montelukast (SINGULAIR) 10 MG tablet SMARTSIG:1 Tablet(s) By Mouth Every Evening     naloxegol  oxalate (MOVANTIK ) 12.5 MG TABS tablet Take 1 tablet (12.5 mg total) by mouth daily. 30 tablet 1   NOVOFINE PEN NEEDLE 32G X  6 MM MISC USE AS DIRECTED 100 each 3   NYSTATIN powder Apply topically.     SUMAtriptan (IMITREX) 100 MG tablet Take by mouth.     tirzepatide  (MOUNJARO ) 10 MG/0.5ML Pen Inject 10 mg into the skin once a week. 6 mL 1   Vitamin D , Ergocalciferol , (DRISDOL ) 1.25 MG (50000 UNIT) CAPS capsule Take 1 capsule (50,000 Units total) by mouth every 7 (seven) days. 12 capsule 3   No current facility-administered medications for this visit.     Past Surgical History:  Procedure Laterality Date   CESAREAN SECTION     DILATION AND CURETTAGE OF UTERUS       Allergies  Allergen Reactions   Oxycodone-Acetaminophen Itching   Ozempic  (0.25 Or 0.5 Mg-Dose) [Semaglutide (0.25 Or 0.5mg -Dos)] Itching and Rash      Family History  Problem Relation Age of Onset    Schizophrenia Father    Alcohol abuse Father    Diabetes Father    Hyperlipidemia Father    Alcohol abuse Paternal Uncle      Social History Ms. Fleites reports that she has been smoking cigarettes. She has a 64 pack-year smoking history. She has never used smokeless tobacco. Ms. Kareem reports no history of alcohol use.   Review of Systems CONSTITUTIONAL: No weight loss, fever, chills, weakness or fatigue.  HEENT: Eyes: No visual loss, blurred vision, double vision or yellow sclerae.No hearing loss, sneezing, congestion, runny nose or sore throat.  SKIN: No rash or itching.  CARDIOVASCULAR:  RESPIRATORY: No shortness of breath, cough or sputum.  GASTROINTESTINAL: No anorexia, nausea, vomiting or diarrhea. No abdominal pain or blood.  GENITOURINARY: No burning on urination, no polyuria NEUROLOGICAL: No headache, dizziness, syncope, paralysis, ataxia, numbness or tingling in the extremities. No change in bowel or bladder control.  MUSCULOSKELETAL: No muscle, back pain, joint pain or stiffness.  LYMPHATICS: No enlarged nodes. No history of splenectomy.  PSYCHIATRIC: No history of depression or anxiety.  ENDOCRINOLOGIC: No reports of sweating, cold or heat intolerance. No polyuria or polydipsia.  SABRA   Physical Examination There were no vitals filed for this visit. There were no vitals filed for this visit.  Gen: resting comfortably, no acute distress HEENT: no scleral icterus, pupils equal round and reactive, no palptable cervical adenopathy,  CV Resp: Clear to auscultation bilaterally GI: abdomen is soft, non-tender, non-distended, normal bowel sounds, no hepatosplenomegaly MSK: extremities are warm, no edema.  Skin: warm, no rash Neuro:  no focal deficits Psych: appropriate affect   Diagnostic Studies     Assessment and Plan        Dorn PHEBE Ross, M.D., F.A.C.C.

## 2024-08-31 ENCOUNTER — Encounter: Payer: Self-pay | Admitting: Cardiology

## 2024-09-08 ENCOUNTER — Telehealth (INDEPENDENT_AMBULATORY_CARE_PROVIDER_SITE_OTHER): Payer: MEDICAID | Admitting: Psychiatry

## 2024-09-08 ENCOUNTER — Encounter (HOSPITAL_COMMUNITY): Payer: Self-pay | Admitting: Psychiatry

## 2024-09-08 DIAGNOSIS — F902 Attention-deficit hyperactivity disorder, combined type: Secondary | ICD-10-CM

## 2024-09-08 DIAGNOSIS — F3162 Bipolar disorder, current episode mixed, moderate: Secondary | ICD-10-CM | POA: Diagnosis not present

## 2024-09-08 MED ORDER — MIRTAZAPINE 30 MG PO TABS: 30.0000 mg | ORAL_TABLET | Freq: Every day | ORAL | 2 refills | Status: DC

## 2024-09-08 MED ORDER — VRAYLAR 4.5 MG PO CAPS: 1.0000 | ORAL_CAPSULE | Freq: Every day | ORAL | 2 refills | Status: DC

## 2024-09-08 MED ORDER — METHYLPHENIDATE HCL 20 MG PO TABS: 20.0000 mg | ORAL_TABLET | Freq: Two times a day (BID) | ORAL | 0 refills | Status: DC

## 2024-09-08 MED ORDER — CLONAZEPAM 1 MG PO TABS: 1.0000 mg | ORAL_TABLET | Freq: Three times a day (TID) | ORAL | 2 refills | Status: DC

## 2024-09-08 MED ORDER — SERTRALINE HCL 100 MG PO TABS: 100.0000 mg | ORAL_TABLET | Freq: Every day | ORAL | 2 refills | Status: DC

## 2024-09-08 NOTE — Progress Notes (Signed)
 Virtual Visit via Video Note  I connected with Catherine Howard on 09/08/24 at 11:00 AM EDT by a video enabled telemedicine application and verified that I am speaking with the correct person using two identifiers.  Location: Patient: home Provider: office   I discussed the limitations of evaluation and management by telemedicine and the availability of in person appointments. The patient expressed understanding and agreed to proceed.     I discussed the assessment and treatment plan with the patient. The patient was provided an opportunity to ask questions and all were answered. The patient agreed with the plan and demonstrated an understanding of the instructions.   The patient was advised to call back or seek an in-person evaluation if the symptoms worsen or if the condition fails to improve as anticipated.  I provided 20 minutes of non-face-to-face time during this encounter.   Barnie Gull, MD  [facility] MD/PA/NP OP Progress Note  09/08/2024 11:11 AM Catherine Howard  MRN:  [id number]  Chief Complaint:  Chief Complaint  Patient presents with   Depression   Anxiety   Manic Behavior   HPI: This patient is a 48 year old divorced white female who lives with her  son in [place]. She had a 41 year old son died of a drug overdose in 09-17-2013. The patient does not work and is on disability   The patient returns for follow-up after 6 weeks regarding her depression anxiety mood swings and ADD.  She told me a few months ago that her fianc died of heart failure.  Obviously this has been quite difficult for her.  Now she states that her uncle has dementia and she is helping her aunt deal with him.  Her father also has fluid on his lung.  She is very worried about all of these people.  She states that she has been more depressed although not suicidal.  She worries all the time and cannot shut her mind off.  She is sleeping better with the mirtazapine .  She no longer thinks the Cymbalta  is helping her  depression.  The patient has tried Prozac Lexapro Viibryd  and Wellbutrin .  I suggested Zoloft  since it also helps anxiety and she is willing to try this.  I also strongly suggested therapy or at least a grief support group which she again declined.  She states the of female friend of hers has moved in and she talks to her quite a bit. Visit Diagnosis:    ICD-10-CM   1. Bipolar 1 disorder, mixed, moderate (HCC)  F31.62     2. Attention deficit hyperactivity disorder (ADHD), combined type  F90.2       Past Psychiatric History: Previous outpatient treatment  Past Medical History:  Past Medical History:  Diagnosis Date   ADHD    Bipolar I disorder (HCC)    Elevated cholesterol    GERD (gastroesophageal reflux disease)    Thyroid disease    Type 2 diabetes mellitus (HCC)    Vitamin D  deficiency     Past Surgical History:  Procedure Laterality Date   CESAREAN SECTION     DILATION AND CURETTAGE OF UTERUS      Family Psychiatric History: See below  Family History:  Family History  Problem Relation Age of Onset   Schizophrenia Father    Alcohol abuse Father    Diabetes Father    Hyperlipidemia Father    Alcohol abuse Paternal Uncle     Social History:  Social History   Socioeconomic History   Marital status: Single  Spouse name: Not on file   Number of children: Not on file   Years of education: Not on file   Highest education level: Not on file  Occupational History   Not on file  Tobacco Use   Smoking status: Every Day    Current packs/day: 2.00    Average packs/day: 2.0 packs/day for 32.0 years (64.0 ttl pk-yrs)    Types: Cigarettes   Smokeless tobacco: Never   Tobacco comments:    smokes 3/4 of a pack a day--01/22/2021  Vaping Use   Vaping status: Former  Substance and Sexual Activity   Alcohol use: No    Alcohol/week: 0.0 standard drinks of alcohol   Drug use: No   Sexual activity: Yes    Partners: Male    Birth control/protection: None  Other Topics  Concern   Not on file  Social History Narrative   Not on file   Social Drivers of Health   Financial Resource Strain: Not on file  Food Insecurity: Not on file  Transportation Needs: Not on file  Physical Activity: Not on file  Stress: Not on file  Social Connections: Not on file    Allergies:  Allergies  Allergen Reactions   Oxycodone-Acetaminophen Itching   Ozempic  (0.25 Or 0.5 Mg-Dose) [Semaglutide (0.25 Or 0.5mg -Dos)] Itching and Rash    Metabolic Disorder Labs: Lab Results  Component Value Date   HGBA1C 6.4 (A) 05/13/2024   No results found for: PROLACTIN Lab Results  Component Value Date   CHOL 137 02/06/2023   TRIG 95 02/06/2023   HDL 55 02/06/2023   CHOLHDL 5.7 (H) 10/17/2021   LDLCALC 64 02/06/2023   LDLCALC 124 (H) 10/17/2021   Lab Results  Component Value Date   TSH 1.380 11/04/2023   TSH 1.400 06/09/2023    Therapeutic Level Labs: Lab Results  Component Value Date   LITHIUM  0.40 (L) 07/14/2014   No results found for: VALPROATE No results found for: CBMZ  Current Medications: Current Outpatient Medications  Medication Sig Dispense Refill   sertraline  (ZOLOFT ) 100 MG tablet Take 1 tablet (100 mg total) by mouth daily. 30 tablet 2   ACCU-CHEK AVIVA PLUS test strip SMARTSIG:Via Meter     Accu-Chek Softclix Lancets lancets SMARTSIG:Topical     atorvastatin (LIPITOR) 40 MG tablet Take 40 mg by mouth daily.     Azelastine-Fluticasone 137-50 MCG/ACT SUSP Place 1 spray into the nose in the morning and at bedtime. One spray each nostril two times daily     Cariprazine  HCl (VRAYLAR ) 4.5 MG CAPS Take 1 capsule (4.5 mg total) by mouth daily. 30 capsule 2   clonazePAM  (KLONOPIN ) 1 MG tablet Take 1 tablet (1 mg total) by mouth 3 (three) times daily. 90 tablet 2   diclofenac  (VOLTAREN ) 75 MG EC tablet Take 1 tablet (75 mg total) by mouth 2 (two) times daily with a meal. 60 tablet 2   DULoxetine  (CYMBALTA ) 60 MG capsule Take 1 capsule (60 mg total) by  mouth 2 (two) times daily. 180 capsule 2   esomeprazole  (NEXIUM ) 40 MG capsule Take 1 capsule (40 mg total) by mouth 2 (two) times daily. Take 30- 60 min before your first and last meals of the day 180 capsule 3   fenofibrate  (TRICOR ) 145 MG tablet Take 1 tablet (145 mg total) by mouth daily. 90 tablet 3   ipratropium (ATROVENT  HFA) 17 MCG/ACT inhaler Inhale 2 puffs into the lungs every 6 (six) hours as needed for wheezing. 1 each 11  ipratropium (ATROVENT ) 0.03 % nasal spray USE TWO SPRAYS in each nostril TWICE DAILY x10 DAYS AS NEEDED     levocetirizine (XYZAL) 5 MG tablet 1 tablet in the evening     levothyroxine  (SYNTHROID ) 75 MCG tablet Take 1 tablet (75 mcg total) by mouth daily. 90 tablet 3   methylphenidate  (RITALIN ) 20 MG tablet Take 1 tablet (20 mg total) by mouth 2 (two) times daily with breakfast and lunch. 60 tablet 0   mirtazapine  (REMERON ) 30 MG tablet Take 1 tablet (30 mg total) by mouth at bedtime. 30 tablet 2   montelukast (SINGULAIR) 10 MG tablet SMARTSIG:1 Tablet(s) By Mouth Every Evening     naloxegol  oxalate (MOVANTIK ) 12.5 MG TABS tablet Take 1 tablet (12.5 mg total) by mouth daily. 30 tablet 1   NOVOFINE PEN NEEDLE 32G X 6 MM MISC USE AS DIRECTED 100 each 3   NYSTATIN powder Apply topically.     SUMAtriptan (IMITREX) 100 MG tablet Take by mouth.     tirzepatide  (MOUNJARO ) 10 MG/0.5ML Pen Inject 10 mg into the skin once a week. 6 mL 1   Vitamin D , Ergocalciferol , (DRISDOL ) 1.25 MG (50000 UNIT) CAPS capsule Take 1 capsule (50,000 Units total) by mouth every 7 (seven) days. 12 capsule 3   No current facility-administered medications for this visit.     Musculoskeletal: Strength & Muscle Tone: within normal limits Gait & Station: normal Patient leans: N/A  Psychiatric Specialty Exam: Review of Systems  Psychiatric/Behavioral:  Positive for dysphoric mood.   All other systems reviewed and are negative.   There were no vitals taken for this visit.There is no height  or weight on file to calculate BMI.  General Appearance: Casual and Fairly Groomed  Eye Contact:  Good  Speech:  Clear and Coherent  Volume:  Normal  Mood:  Dysphoric  Affect:  Flat  Thought Process:  Goal Directed  Orientation:  Full (Time, Place, and Person)  Thought Content: Rumination   Suicidal Thoughts:  No  Homicidal Thoughts:  No  Memory:  Immediate;   Good Recent;   Good Remote;   NA  Judgement:  Fair  Insight:  Fair  Psychomotor Activity:  Decreased  Concentration:  Concentration: Good and Attention Span: Good  Recall:  Good  Fund of Knowledge: Fair  Language: Good  Akathisia:  No  Handed:  Right  AIMS (if indicated): not done  Assets:  Communication Skills Desire for Improvement Resilience Social Support  ADL's:  Intact  Cognition: WNL  Sleep:  Good   Screenings: GAD-7    Flowsheet Row Office Visit from 03/13/2023 in [facility] [place]  Total GAD-7 Score 14   PHQ2-9    Flowsheet Row Office Visit from 03/13/2023 in [facility] Video Visit from 09/26/2022 in [facility] [place] Video Visit from 08/29/2022 in [facility] [place] Video Visit from 07/04/2022 in [facility] at [place] Video Visit from 06/05/2022 in [facility] at [facility] Total Score 4 0 1 1 1   PHQ-9 Total Score 12 -- -- -- --   Flowsheet Row Office Visit from 03/13/2023 in [facility] [place] Video Visit from 09/26/2022 in [facility] at [place] [facility] Video Visit from 08/29/2022 in [facility] at [facility] RISK CATEGORY No Risk No Risk No Risk     Assessment  and Plan: This patient is a 48 year old female with a history of bipolar disorder anxiety and ADD.  She has been more depressed since her  fianc passed away in 2024-07-06.  She is also dealing with illnesses of various family members.  She does not think the Cymbalta  is helping so we will switch to Zoloft  100 mg daily.  She will continue mirtazapine  30 mg at bedtime for anxiety and sleep, methylphenidate  20 mg twice daily for ADD, clonazepam  1 mg 3 times daily for anxiety and Vraylar  4.5 mg daily for mood stabilization.  She will return to see me in 6 weeks  Collaboration of Care: Collaboration of Care: Primary Care Provider AEB notes will be shared with PCP at patient's request  Patient/Guardian was advised Release of Information must be obtained prior to any record release in order to collaborate their care with an outside provider. Patient/Guardian was advised if they have not already done so to contact the registration department to sign all necessary forms in order for us  to release information regarding their care.   Consent: Patient/Guardian gives verbal consent for treatment and assignment of benefits for services provided during this visit. Patient/Guardian expressed understanding and agreed to proceed.    Barnie Gull, MD 09/08/2024, 11:11 AM

## 2024-09-13 ENCOUNTER — Ambulatory Visit: Payer: MEDICAID | Admitting: Nurse Practitioner

## 2024-09-13 DIAGNOSIS — E119 Type 2 diabetes mellitus without complications: Secondary | ICD-10-CM

## 2024-09-13 DIAGNOSIS — E039 Hypothyroidism, unspecified: Secondary | ICD-10-CM

## 2024-09-13 DIAGNOSIS — E559 Vitamin D deficiency, unspecified: Secondary | ICD-10-CM

## 2024-09-13 DIAGNOSIS — Z7985 Long-term (current) use of injectable non-insulin antidiabetic drugs: Secondary | ICD-10-CM

## 2024-09-13 DIAGNOSIS — E782 Mixed hyperlipidemia: Secondary | ICD-10-CM

## 2024-09-14 ENCOUNTER — Encounter: Payer: Self-pay | Admitting: Nurse Practitioner

## 2024-09-14 ENCOUNTER — Ambulatory Visit (INDEPENDENT_AMBULATORY_CARE_PROVIDER_SITE_OTHER): Payer: MEDICAID | Admitting: Nurse Practitioner

## 2024-09-14 VITALS — BP 122/80 | HR 121 | Ht 63.5 in | Wt 191.0 lb

## 2024-09-14 DIAGNOSIS — E039 Hypothyroidism, unspecified: Secondary | ICD-10-CM | POA: Diagnosis not present

## 2024-09-14 DIAGNOSIS — Z7985 Long-term (current) use of injectable non-insulin antidiabetic drugs: Secondary | ICD-10-CM

## 2024-09-14 DIAGNOSIS — E119 Type 2 diabetes mellitus without complications: Secondary | ICD-10-CM | POA: Diagnosis not present

## 2024-09-14 LAB — POCT GLYCOSYLATED HEMOGLOBIN (HGB A1C): Hemoglobin A1C: 6.2 % — AB (ref 4.0–5.6)

## 2024-09-14 MED ORDER — LEVOTHYROXINE SODIUM 75 MCG PO TABS: 75.0000 ug | ORAL_TABLET | Freq: Every day | ORAL | 3 refills | Status: AC

## 2024-09-14 NOTE — Progress Notes (Signed)
 09/14/2024, 10:38 AM Endocrinology Follow Up Visit   Subjective:    Patient ID: Catherine Howard, female    DOB: [birth year].  Destanie Tibbetts is being seen in follow up for management of currently uncontrolled symptomatic diabetes requested by  Johnson Morna FALCON, NP.   Past Medical History:  Diagnosis Date   ADHD    Bipolar I disorder (HCC)    Elevated cholesterol    GERD (gastroesophageal reflux disease)    Thyroid disease    Type 2 diabetes mellitus (HCC)    Vitamin D  deficiency     Past Surgical History:  Procedure Laterality Date   CESAREAN SECTION     DILATION AND CURETTAGE OF UTERUS      Social History   Socioeconomic History   Marital status: Single    Spouse name: Not on file   Number of children: Not on file   Years of education: Not on file   Highest education level: Not on file  Occupational History   Not on file  Tobacco Use   Smoking status: Every Day    Current packs/day: 2.00    Average packs/day: 2.0 packs/day for 32.0 years (64.0 ttl pk-yrs)    Types: Cigarettes   Smokeless tobacco: Never   Tobacco comments:    smokes 3/4 of a pack a day--01/22/2021  Vaping Use   Vaping status: Former  Substance and Sexual Activity   Alcohol use: No    Alcohol/week: 0.0 standard drinks of alcohol   Drug use: No   Sexual activity: Yes    Partners: Male    Birth control/protection: None  Other Topics Concern   Not on file  Social History Narrative   Not on file   Social Drivers of Health   Financial Resource Strain: Not on file  Food Insecurity: Not on file  Transportation Needs: Not on file  Physical Activity: Not on file  Stress: Not on file  Social Connections: Not on file    Family History  Problem Relation Age of Onset   Schizophrenia Father    Alcohol abuse Father    Diabetes Father    Hyperlipidemia Father    Alcohol abuse Paternal Uncle      Outpatient Encounter Medications as of 09/14/2024  Medication Sig   ACCU-CHEK AVIVA PLUS test strip SMARTSIG:Via Meter   Accu-Chek Softclix Lancets lancets SMARTSIG:Topical   atorvastatin (LIPITOR) 40 MG tablet Take 40 mg by mouth daily.   Azelastine-Fluticasone 137-50 MCG/ACT SUSP Place 1 spray into the nose in the morning and at bedtime. One spray each nostril two times daily   Cariprazine  HCl (VRAYLAR ) 4.5 MG CAPS Take 1 capsule (4.5 mg total) by mouth daily.   clonazePAM  (KLONOPIN ) 1 MG tablet Take 1 tablet (1 mg total) by mouth 3 (three) times daily.   diclofenac  (VOLTAREN ) 75 MG EC tablet Take 1 tablet (75 mg total) by mouth 2 (two) times daily with a meal.   DULoxetine  (CYMBALTA ) 60 MG capsule Take 1 capsule (60 mg total) by mouth 2 (two) times daily.   esomeprazole  (NEXIUM ) 40 MG capsule Take 1 capsule (40 mg  total) by mouth 2 (two) times daily. Take 30- 60 min before your first and last meals of the day   fenofibrate  (TRICOR ) 145 MG tablet Take 1 tablet (145 mg total) by mouth daily.   ipratropium (ATROVENT  HFA) 17 MCG/ACT inhaler Inhale 2 puffs into the lungs every 6 (six) hours as needed for wheezing.   ipratropium (ATROVENT ) 0.03 % nasal spray USE TWO SPRAYS in each nostril TWICE DAILY x10 DAYS AS NEEDED   levocetirizine (XYZAL) 5 MG tablet 1 tablet in the evening   methylphenidate  (RITALIN ) 20 MG tablet Take 1 tablet (20 mg total) by mouth 2 (two) times daily with breakfast and lunch.   mirtazapine  (REMERON ) 30 MG tablet Take 1 tablet (30 mg total) by mouth at bedtime.   montelukast (SINGULAIR) 10 MG tablet SMARTSIG:1 Tablet(s) By Mouth Every Evening   MOUNJARO  12.5 MG/0.5ML Pen Inject 12.5 mg into the skin once a week.   naloxegol  oxalate (MOVANTIK ) 12.5 MG TABS tablet Take 1 tablet (12.5 mg total) by mouth daily.   NOVOFINE PEN NEEDLE 32G X 6 MM MISC USE AS DIRECTED   NYSTATIN powder Apply topically.   sertraline  (ZOLOFT ) 100 MG tablet Take 1 tablet (100 mg total) by mouth  daily.   SUMAtriptan (IMITREX) 100 MG tablet Take by mouth.   Vitamin D , Ergocalciferol , (DRISDOL ) 1.25 MG (50000 UNIT) CAPS capsule Take 1 capsule (50,000 Units total) by mouth every 7 (seven) days.   [DISCONTINUED] levothyroxine  (SYNTHROID ) 75 MCG tablet Take 1 tablet (75 mcg total) by mouth daily.   [DISCONTINUED] tirzepatide  (MOUNJARO ) 10 MG/0.5ML Pen Inject 10 mg into the skin once a week. (Patient taking differently: Inject 12.5 mg into the skin once a week.)   levothyroxine  (SYNTHROID ) 75 MCG tablet Take 1 tablet (75 mcg total) by mouth daily.   No facility-administered encounter medications on file as of 09/14/2024.    ALLERGIES: Allergies  Allergen Reactions   Oxycodone-Acetaminophen Itching   Ozempic  (0.25 Or 0.5 Mg-Dose) [Semaglutide (0.25 Or 0.5mg -Dos)] Itching and Rash    VACCINATION STATUS: Immunization History  Administered Date(s) Administered   Tdap 01/28/2017    Diabetes She presents for her follow-up diabetic visit. She has type 2 diabetes mellitus. Onset time: She was diagnosed at approximate age of 14 years. Her disease course has been stable. There are no hypoglycemic associated symptoms. Pertinent negatives for hypoglycemia include no confusion, headaches, nervousness/anxiousness, pallor or seizures. Associated symptoms include fatigue. Pertinent negatives for diabetes include no chest pain, no foot ulcerations, no polydipsia, no polyphagia, no polyuria, no visual change and no weight loss. There are no hypoglycemic complications. Symptoms are stable. There are no diabetic complications. Risk factors for coronary artery disease include diabetes mellitus, dyslipidemia, obesity, sedentary lifestyle, tobacco exposure and hypertension. Current diabetic treatments: Mounjaro . She is compliant with treatment most of the time. Her weight is fluctuating minimally. She is following a generally healthy diet. When asked about meal planning, she reported none. She has not had a  previous visit with a dietitian. She rarely participates in exercise. (She presents today with no meter or logs to review, was not asked to routinely monitor given safe regimen with Mounjaro  only.  Her POCT A1c today is 6.2%, improving from last visit of 6.4%.  She denies any unpleasant side effects of this medication.  Her PCP recently increased her Mounjaro  to 12.5 mg SQ weekly, has had one injection thus far and denies any unpleasant side effects.  She does continue to smoke, but notes it is not as much  as previously.) An ACE inhibitor/angiotensin II receptor blocker is not being taken. She does not see a podiatrist.Eye exam is current.  Hyperlipidemia This is a chronic problem. The current episode started more than 1 year ago. The problem is uncontrolled (yet improving). Recent lipid tests were reviewed and are high. Exacerbating diseases include diabetes, hypothyroidism and obesity. Factors aggravating her hyperlipidemia include fatty foods and smoking. Pertinent negatives include no chest pain, myalgias or shortness of breath. Current antihyperlipidemic treatment includes statins and fibric acid derivatives. The current treatment provides moderate improvement of lipids. Compliance problems include adherence to diet and adherence to exercise.  Risk factors for coronary artery disease include diabetes mellitus, dyslipidemia, a sedentary lifestyle, obesity and hypertension.    Review of systems  Constitutional: + stable body weight,  current Body mass index is 33.3 kg/m. , + fatigue, + subjective hyperthermia, no subjective hypothermia Eyes: no blurry vision, no xerophthalmia ENT: no sore throat, no nodules palpated in throat, no dysphagia/odynophagia, no hoarseness Cardiovascular: no chest pain, no shortness of breath, no palpitations, no leg swelling Respiratory: no cough, no shortness of breath Gastrointestinal: no nausea/vomiting/diarrhea Musculoskeletal: no muscle/joint aches Skin: no rashes,  no hyperemia Neurological: no tremors, no numbness, no tingling, no dizziness Psychiatric: no depression, no anxiety, hx bipolar- currently controlled on meds  Objective:     BP 122/80 (BP Location: Left Arm, Patient Position: Sitting, Cuff Size: Large)   Pulse (!) 121   Ht 5' 3.5 (1.613 m)   Wt 191 lb (86.6 kg)   BMI 33.30 kg/m   Wt Readings from Last 3 Encounters:  09/14/24 191 lb (86.6 kg)  05/19/24 191 lb (86.6 kg)  05/13/24 191 lb 3.2 oz (86.7 kg)    BP Readings from Last 3 Encounters:  09/14/24 122/80  05/19/24 113/74  05/13/24 108/76     Physical Exam- Limited  Constitutional:  Body mass index is 33.3 kg/m. , not in acute distress, normal state of mind Eyes:  EOMI, no exophthalmos Musculoskeletal: no gross deformities, strength intact in all four extremities, no gross restriction of joint movements Skin:  no rashes, no hyperemia Neurological: no tremor with outstretched hands   Diabetic Foot Exam - Simple   No data filed    CMP ( most recent) CMP     Component Value Date/Time   NA 137 11/04/2023 0954   K 5.5 (H) 11/04/2023 0954   CL 101 11/04/2023 0954   CO2 22 11/04/2023 0954   GLUCOSE 106 (H) 11/04/2023 0954   GLUCOSE 89 07/14/2014 0958   BUN 7 11/04/2023 0954   CREATININE 1.06 (H) 11/04/2023 0954   CREATININE 0.75 07/14/2014 0958   CALCIUM 9.6 11/04/2023 0954   PROT 6.6 11/04/2023 0954   ALBUMIN 4.2 11/04/2023 0954   AST 18 11/04/2023 0954   ALT 22 11/04/2023 0954   ALKPHOS 128 (H) 11/04/2023 0954   BILITOT <0.2 11/04/2023 0954   GFRNONAA 80 10/10/2020 0000     Diabetic Labs (most recent): Lab Results  Component Value Date   HGBA1C 6.2 (A) 09/14/2024   HGBA1C 6.4 (A) 05/13/2024   HGBA1C 6.2 (A) 11/05/2023   MICROALBUR <0.2 02/06/2023   MICROALBUR 10 07/08/2022   MICROALBUR 10 01/24/2021     Lipid Panel     Component Value Date/Time   CHOL 137 02/06/2023 0000   CHOL 221 (H) 10/17/2021 1008   TRIG 95 02/06/2023 0000   HDL 55  02/06/2023 0000   HDL 39 (L) 10/17/2021 1008   CHOLHDL 5.7 (H)  10/17/2021 1008   LDLCALC 64 02/06/2023 0000   LDLCALC 124 (H) 10/17/2021 1008   LABVLDL 58 (H) 10/17/2021 1008     Latest Reference Range & Units 10/17/21 00:00 10/17/21 10:08 02/27/22 08:39 02/06/23 00:00 06/09/23 11:04 11/04/23 09:54  TSH 0.450 - 4.500 uIU/mL 0.58 (E) 0.575 1.670 1.82 1.400 1.380  T4,Free(Direct) 0.82 - 1.77 ng/dL  8.64 8.46  8.42 8.61  (E): External lab result  Assessment & Plan:   1) Type 2 diabetes mellitus without complication, without long-term current use of insulin (HCC)  - [name] has currently uncontrolled symptomatic type 2 DM since  48 years of age.  She presents today with no meter or logs to review, was not asked to routinely monitor given safe regimen with Mounjaro  only.  Her POCT A1c today is 6.2%, improving from last visit of 6.4%.  She denies any unpleasant side effects of this medication.  Her PCP recently increased her Mounjaro  to 12.5 mg SQ weekly, has had one injection thus far and denies any unpleasant side effects.  She does continue to smoke, but notes it is not as much as previously.  - I had a long discussion with her about the progressive nature of diabetes and the pathology behind its complications. -her diabetes is complicated by obesity/sedentary life, chronic  Smoking, sleep apnea and she remains at a high risk for more acute and chronic complications which include CAD, CVA, CKD, retinopathy, and neuropathy. These are all discussed in detail with her.  - Nutritional counseling repeated at each appointment due to patients tendency to fall back in to old habits.  - The patient admits there is a room for improvement in their diet and drink choices. -  Suggestion is made for the patient to avoid simple carbohydrates from their diet including Cakes, Sweet Desserts / Pastries, Ice Cream, Soda (diet and regular), Sweet Tea, Candies, Chips, Cookies, Sweet Pastries, Store Bought  Juices, Alcohol in Excess of 1-2 drinks a day, Artificial Sweeteners, Coffee Creamer, and Sugar-free Products. This will help patient to have stable blood glucose profile and potentially avoid unintended weight gain.   - I encouraged the patient to switch to unprocessed or minimally processed complex starch and increased protein intake (animal or plant source), fruits, and vegetables.   - Patient is advised to stick to a routine mealtimes to eat 3 meals a day and avoid unnecessary snacks (to snack only to correct hypoglycemia).  - I have approached her with the following individualized plan to manage  her diabetes and patient agrees:   -She is advised to continue Mounjaro  12.5 mg SQ weekly.   I discussed the potential dangers of taking GLP1 product while smoking as it increases risk of pancreatitis.  I urged her to quit once again.  -She can take a break from routine glucose monitoring due to monotherapy with Mounjaro  only.  - Specific targets for  A1c;  LDL, HDL,  and Triglycerides were discussed with the patient.  2) Blood Pressure /Hypertension: Her blood pressure is controlled to target.  She is not currently on any antihypertensive medications at this time.  If BP is elevated on 3 separate occasions, she will be considered for low dose ACE/ARB for renal protection from DM.  3) Lipids/Hyperlipidemia:  Her most recent lipid panel from 02/06/23 shows uncontrolled LDL of 64 (greatly improved).  She is advised to continue Crestor 20 mg po daily at bedtime and Fenofibrate  145 mg po daily.  Side effects and precautions discussed with her.  She is also advised to avoid fried foods and butter.   She has labs upcoming with PCP, I have requested she have copy sent to us  for records.  4)  Weight/Diet:  Her Body mass index is 33.3 kg/m.-   clearly complicating her diabetes care.   she is  a candidate for weight loss. I discussed with her the fact that loss of 5 - 10% of her  current body weight will  have the most impact on her diabetes management.  Exercise, and detailed carbohydrates information provided  -  detailed on discharge instructions.    5) Hypothyroidism- unspecified There are no recent TFTs to review.  She is advised to continue her Levothyroxine  75 mcg po daily before breakfast.  She has labs coming up with her PCP.  I asked her to have a copy of any labs sent to use for review and record keeping.   - The correct intake of thyroid hormone (Levothyroxine , Synthroid ), is on empty stomach first thing in the morning, with water, separated by at least 30 minutes from breakfast and other medications,  and separated by more than 4 hours from calcium, iron, multivitamins, acid reflux medications (PPIs).  - This medication is a life-long medication and will be needed to correct thyroid hormone imbalances for the rest of your life.  The dose may change from time to time, based on thyroid blood work.  - It is extremely important to be consistent taking this medication, near the same time each morning.  -AVOID TAKING PRODUCTS CONTAINING BIOTIN (commonly found in Hair, Skin, Nails vitamins) AS IT INTERFERES WITH THE VALIDITY OF THYROID FUNCTION BLOOD TESTS.  6) Vitamin D  Deficiency Her most recent vitamin D  level was 30 on 13/8/23.  She is currently taking Ergocalciferol  50000 units weekly, she is advised to continue.    7) Chronic Care/Health Maintenance: -she is on Statin medications and  is encouraged to initiate and continue to follow up with Ophthalmology, Dentist, Podiatrist at least yearly or according to recommendations, and advised to  Quit smoking. I have recommended yearly flu vaccine and pneumonia vaccine at least every 5 years; moderate intensity exercise for up to 150 minutes weekly; and  sleep for at least 7 hours a day.  Smoking cessation instruction/counseling given:  counseled patient on the dangers of tobacco use, advised patient to stop smoking, and reviewed strategies to  maximize success   - she is advised to maintain close follow up with Strader, Lindsey F, NP for primary care needs, as well as her other providers for optimal and coordinated care.  I did enter referral to podiatry for DM foot care.    I spent  27  minutes in the care of the patient today including review of labs from CMP, Lipids, Thyroid Function, Hematology (current and previous including abstractions from other facilities); face-to-face time discussing  her blood glucose readings/logs, discussing hypoglycemia and hyperglycemia episodes and symptoms, medications doses, her options of short and long term treatment based on the latest standards of care / guidelines;  discussion about incorporating lifestyle medicine;  and documenting the encounter. Risk reduction counseling performed per USPSTF guidelines to reduce obesity and cardiovascular risk factors.     Please refer to Patient Instructions for Blood Glucose Monitoring and Insulin/Medications Dosing Guide  in media tab for additional information. Please  also refer to  Patient Self Inventory in the Media  tab for reviewed elements of pertinent patient history.  Rosaline Rigg participated in the discussions, expressed understanding,  and voiced agreement with the above plans.  All questions were answered to her satisfaction. she is encouraged to contact clinic should she have any questions or concerns prior to her return visit.    Follow up plan: - Return in about 6 months (around 03/14/2025) for Diabetes F/U with A1c in office, Thyroid follow up, Previsit labs. Benton Rio, [facility] [address] [place], [place] [address] Phone: [phone] Fax: [phone]  09/14/2024, 10:38 AM

## 2024-09-14 NOTE — Patient Instructions (Signed)
 High-Fiber Eating Plan Fiber, also called dietary fiber, is found in foods such as fruits, vegetables, whole grains, and beans. A high-fiber diet can be good for your health. Your health care provider may recommend a high-fiber diet to help: Prevent trouble pooping (constipation). Lower your cholesterol. Treat the following conditions: Hemorrhoids. This is inflammation of veins in the anus. Inflammation of specific areas of the digestive tract. Irritable bowel syndrome (IBS). This is a problem of the large intestine, also called the colon, that sometimes causes belly pain and bloating. Prevent overeating as part of a weight-loss plan. Lower the risk of heart disease, type 2 diabetes, and certain cancers. What are tips for following this plan? Reading food labels  Check the nutrition facts label on foods for the amount of dietary fiber. Choose foods that have 4 grams of fiber or more per serving. The recommended goals for how much fiber you should eat each day include: Males 40 years old or younger: 30-34 g. Males over 37 years old: 28-34 g. Females 64 years old or younger: 25-28 g. Females over 69 years old: 22-25 g. Your daily fiber goal is _____________ g. Shopping Choose whole fruits and vegetables instead of processed. For example, choose apples instead of apple juice or applesauce. Choose a variety of high-fiber foods such as avocados, lentils, oats, and pinto beans. Read the nutrition facts label on foods. Check for foods with added fiber. These foods often have high sugar and salt (sodium) amounts per serving. Cooking Use whole-grain flour for baking and cooking. Cook with brown rice instead of white rice. Make meals that have a lot of beans and vegetables in them, such as chili or vegetable-based soups. Meal planning Start the day with a breakfast that is high in fiber, such as a cereal that has 5 g of fiber or more per serving. Eat breads and cereals that are made with  whole-grain flour instead of refined flour or white flour. Eat brown rice, bulgur wheat, or millet instead of white rice. Use beans in place of meat in soups, salads, and pasta dishes. Be sure that half of the grains you eat each day are whole grains. General information You can get the recommended amount of dietary fiber by: Eating a variety of fruits, vegetables, grains, nuts, and beans. Taking a fiber supplement if you aren't able to eat enough fiber. It's better to get fiber through food than from a supplement. Slowly increase how much fiber you eat. If you increase the amount of fiber you eat too quickly, you may have bloating, cramping, or gas. Drink plenty of water to help you digest fiber. Choose high-fiber snacks, such as berries, raw vegetables, nuts, and popcorn. What foods should I eat? Fruits Berries. Pears. Apples. Oranges. Avocado. Prunes and raisins. Dried figs. Vegetables Sweet potatoes. Spinach. Kale. Artichokes. Cabbage. Broccoli. Cauliflower. Green peas. Carrots. Squash. Grains Whole-grain breads. Multigrain cereal. Oats and oatmeal. Brown rice. Barley. Bulgur wheat. Millet. Quinoa. Bran muffins. Popcorn. Rye wafer crackers. Meats and other proteins Navy beans, kidney beans, and pinto beans. Soybeans. Split peas. Lentils. Nuts and seeds. Dairy Fiber-fortified yogurt. Fortified means that fiber has been added to the product. Beverages Fiber-fortified soy milk. Fiber-fortified orange juice. Other foods Fiber bars. The items listed above may not be all the foods and drinks you can have. Talk to a dietitian to learn more. What foods should I avoid? Fruits Fruit juice. Cooked, strained fruit. Vegetables Fried potatoes. Canned vegetables. Well-cooked vegetables. Grains White bread. Pasta made with refined  flour. White rice. Meats and other proteins Fatty meat. Fried chicken or fried fish. Dairy Milk. Cream cheese. Sour cream. Fats and  oils Butters. Beverages Soft drinks. Other foods Cakes and pastries. The items listed above may not be all the foods and drinks you should avoid. Talk to a dietitian to learn more. This information is not intended to replace advice given to you by your health care provider. Make sure you discuss any questions you have with your health care provider. Document Revised: 03/03/2023 Document Reviewed: 03/03/2023 Elsevier Patient Education  2024 [employer].

## 2024-10-06 ENCOUNTER — Encounter (INDEPENDENT_AMBULATORY_CARE_PROVIDER_SITE_OTHER): Payer: Self-pay | Admitting: Gastroenterology

## 2024-10-06 LAB — LAB REPORT - SCANNED
A1c: 6
EGFR: 98
Free T4: 1.2
TSH: 2.24 (ref 0.41–5.90)

## 2024-10-25 ENCOUNTER — Encounter: Payer: Self-pay | Admitting: Radiology

## 2024-10-29 ENCOUNTER — Telehealth (HOSPITAL_COMMUNITY): Payer: Self-pay

## 2024-10-29 NOTE — Telephone Encounter (Signed)
 Prior authorization for pt's Vraylar  Cap 5.4MG  approved from 10/29/24 to 10/29/25

## 2024-11-01 ENCOUNTER — Other Ambulatory Visit (HOSPITAL_COMMUNITY): Payer: Self-pay | Admitting: Psychiatry

## 2024-11-01 ENCOUNTER — Other Ambulatory Visit: Payer: Self-pay | Admitting: Internal Medicine

## 2024-11-09 ENCOUNTER — Encounter: Payer: Self-pay | Admitting: *Deleted

## 2024-11-11 ENCOUNTER — Telehealth (HOSPITAL_COMMUNITY): Payer: MEDICAID | Admitting: Psychiatry

## 2024-11-19 ENCOUNTER — Other Ambulatory Visit (HOSPITAL_COMMUNITY): Payer: Self-pay | Admitting: Psychiatry

## 2024-11-21 NOTE — Telephone Encounter (Signed)
Call for appt, missed last one

## 2024-11-30 ENCOUNTER — Telehealth (HOSPITAL_COMMUNITY): Payer: MEDICAID | Admitting: Psychiatry

## 2024-12-06 ENCOUNTER — Encounter (HOSPITAL_COMMUNITY): Payer: Self-pay | Admitting: Psychiatry

## 2024-12-06 ENCOUNTER — Other Ambulatory Visit: Payer: Self-pay | Admitting: Nurse Practitioner

## 2024-12-06 ENCOUNTER — Telehealth (INDEPENDENT_AMBULATORY_CARE_PROVIDER_SITE_OTHER): Payer: MEDICAID | Admitting: Psychiatry

## 2024-12-06 DIAGNOSIS — E119 Type 2 diabetes mellitus without complications: Secondary | ICD-10-CM

## 2024-12-06 DIAGNOSIS — Z7985 Long-term (current) use of injectable non-insulin antidiabetic drugs: Secondary | ICD-10-CM

## 2024-12-06 DIAGNOSIS — F3162 Bipolar disorder, current episode mixed, moderate: Secondary | ICD-10-CM

## 2024-12-06 DIAGNOSIS — F902 Attention-deficit hyperactivity disorder, combined type: Secondary | ICD-10-CM

## 2024-12-06 MED ORDER — MIRTAZAPINE 45 MG PO TABS: 45.0000 mg | ORAL_TABLET | Freq: Every day | ORAL | 2 refills | Status: DC

## 2024-12-06 MED ORDER — SERTRALINE HCL 100 MG PO TABS: 150.0000 mg | ORAL_TABLET | Freq: Every day | ORAL | 2 refills | Status: DC

## 2024-12-06 MED ORDER — CLONAZEPAM 1 MG PO TABS: 1.0000 mg | ORAL_TABLET | Freq: Three times a day (TID) | ORAL | 2 refills | Status: DC

## 2024-12-06 MED ORDER — VRAYLAR 4.5 MG PO CAPS: 1.0000 | ORAL_CAPSULE | Freq: Every day | ORAL | 2 refills | Status: DC

## 2024-12-06 MED ORDER — METHYLPHENIDATE HCL 20 MG PO TABS: 20.0000 mg | ORAL_TABLET | Freq: Two times a day (BID) | ORAL | 0 refills | Status: DC

## 2024-12-06 NOTE — Progress Notes (Signed)
 Virtual Visit via Video Note  I connected with Catherine Howard on 12/06/2024 at 11:20 AM EST by a video enabled telemedicine application and verified that I am speaking with the correct person using two identifiers.  Location: Patient: home Provider: office   I discussed the limitations of evaluation and management by telemedicine and the availability of in person appointments. The patient expressed understanding and agreed to proceed.      I discussed the assessment and treatment plan with the patient. The patient was provided an opportunity to ask questions and all were answered. The patient agreed with the plan and demonstrated an understanding of the instructions.   The patient was advised to call back or seek an in-person evaluation if the symptoms worsen or if the condition fails to improve as anticipated.  I provided 20 minutes of non-face-to-face time during this encounter.   Barnie Gull, MD  [facility] MD/PA/NP OP Progress Note  12/06/2024 11:37 AM Catherine Howard  MRN:  [id number]  Chief Complaint:  Chief Complaint  Patient presents with   Anxiety   Depression   Manic Behavior   HPI: This patient is a 48 year old divorced white female who lives with her son in [place]. She had a 104 year old son died of a drug overdose in January 08, 2014. The patient does not work and is on disability   The patient returns for follow-up after 3 months regarding her major depression generalized anxiety bipolar disorder and ADD.  She states that she has been struggling lately.  She is worried about her uncle who is now on hospice care.  She is worried about her parents who have her own health issues.  She finds it hard to go to sleep.  The mirtazapine  only helps a little bit I suggested we increase this.  She states she has been more depressed lately although the Zoloft  has helped a little I also suggested we increase this as well.  She denies any thoughts of self-harm or suicide.  The clonazepam  continues to  help with anxiety and methylphenidate  continues to help with focus. Visit Diagnosis:    ICD-10-CM   1. Bipolar 1 disorder, mixed, moderate (HCC)  F31.62     2. Attention deficit hyperactivity disorder (ADHD), combined type  F90.2       Past Psychiatric History: Previous outpatient treatment  Past Medical History:  Past Medical History:  Diagnosis Date   ADHD    Bipolar I disorder (HCC)    Elevated cholesterol    GERD (gastroesophageal reflux disease)    Thyroid disease    Type 2 diabetes mellitus (HCC)    Vitamin D  deficiency     Past Surgical History:  Procedure Laterality Date   CESAREAN SECTION     DILATION AND CURETTAGE OF UTERUS      Family Psychiatric History: See below  Family History:  Family History  Problem Relation Age of Onset   Schizophrenia Father    Alcohol abuse Father    Diabetes Father    Hyperlipidemia Father    Alcohol abuse Paternal Uncle     Social History:  Social History   Socioeconomic History   Marital status: Single    Spouse name: Not on file   Number of children: Not on file   Years of education: Not on file   Highest education level: Not on file  Occupational History   Not on file  Tobacco Use   Smoking status: Every Day    Current packs/day: 2.00    Average packs/day:  2.0 packs/day for 32.0 years (64.0 ttl pk-yrs)    Types: Cigarettes   Smokeless tobacco: Never   Tobacco comments:    smokes 3/4 of a pack a day--01/22/2021  Vaping Use   Vaping status: Former  Substance and Sexual Activity   Alcohol use: No    Alcohol/week: 0.0 standard drinks of alcohol   Drug use: No   Sexual activity: Yes    Partners: Male    Birth control/protection: None  Other Topics Concern   Not on file  Social History Narrative   Not on file   Social Drivers of Health   Tobacco Use: High Risk (12/06/2024)   Patient History    Smoking Tobacco Use: Every Day    Smokeless Tobacco Use: Never    Passive Exposure: Not on file  Financial  Resource Strain: Not on file  Food Insecurity: Not on file  Transportation Needs: Not on file  Physical Activity: Not on file  Stress: Not on file  Social Connections: Not on file  Depression (PHQ2-9): High Risk (03/13/2023)   Depression (PHQ2-9)    PHQ-2 Score: 12  Alcohol Screen: Not on file  Housing: Not on file  Utilities: Not on file  Health Literacy: Not on file    Allergies: Allergies[1]  Metabolic Disorder Labs: Lab Results  Component Value Date   HGBA1C 6.2 (A) 09/14/2024   No results found for: PROLACTIN Lab Results  Component Value Date   CHOL 137 02/06/2023   TRIG 95 02/06/2023   HDL 55 02/06/2023   CHOLHDL 5.7 (H) 10/17/2021   LDLCALC 64 02/06/2023   LDLCALC 124 (H) 10/17/2021   Lab Results  Component Value Date   TSH 2.24 10/06/2024   TSH 1.380 11/04/2023    Therapeutic Level Labs: Lab Results  Component Value Date   LITHIUM  0.40 (L) 07/14/2014   No results found for: VALPROATE No results found for: CBMZ  Current Medications: Current Outpatient Medications  Medication Sig Dispense Refill   mirtazapine  (REMERON ) 45 MG tablet Take 1 tablet (45 mg total) by mouth at bedtime. 30 tablet 2   ACCU-CHEK AVIVA PLUS test strip SMARTSIG:Via Meter     Accu-Chek Softclix Lancets lancets SMARTSIG:Topical     atorvastatin (LIPITOR) 40 MG tablet Take 40 mg by mouth daily.     Azelastine-Fluticasone 137-50 MCG/ACT SUSP Place 1 spray into the nose in the morning and at bedtime. One spray each nostril two times daily     Cariprazine  HCl (VRAYLAR ) 4.5 MG CAPS Take 1 capsule (4.5 mg total) by mouth daily. 30 capsule 2   clonazePAM  (KLONOPIN ) 1 MG tablet Take 1 tablet (1 mg total) by mouth 3 (three) times daily. 90 tablet 2   diclofenac  (VOLTAREN ) 75 MG EC tablet Take 1 tablet (75 mg total) by mouth 2 (two) times daily with a meal. 60 tablet 2   esomeprazole  (NEXIUM ) 40 MG capsule Take 1 capsule (40 mg total) by mouth 2 (two) times daily. Take 30- 60 min before  your first and last meals of the day 180 capsule 3   fenofibrate  (TRICOR ) 145 MG tablet Take 1 tablet (145 mg total) by mouth daily. 90 tablet 3   ipratropium (ATROVENT  HFA) 17 MCG/ACT inhaler Inhale 2 puffs into the lungs every 6 (six) hours as needed for wheezing. 1 each 11   ipratropium (ATROVENT ) 0.03 % nasal spray USE TWO SPRAYS in each nostril TWICE DAILY x10 DAYS AS NEEDED     levocetirizine (XYZAL) 5 MG tablet 1 tablet in  the evening     levothyroxine  (SYNTHROID ) 75 MCG tablet Take 1 tablet (75 mcg total) by mouth daily. 90 tablet 3   methylphenidate  (RITALIN ) 20 MG tablet Take 1 tablet (20 mg total) by mouth 2 (two) times daily with breakfast and lunch. 60 tablet 0   montelukast (SINGULAIR) 10 MG tablet SMARTSIG:1 Tablet(s) By Mouth Every Evening     MOUNJARO  12.5 MG/0.5ML Pen Inject 12.5 mg into the skin once a week.     naloxegol  oxalate (MOVANTIK ) 12.5 MG TABS tablet Take 1 tablet (12.5 mg total) by mouth daily. 30 tablet 1   NOVOFINE PEN NEEDLE 32G X 6 MM MISC USE AS DIRECTED 100 each 3   NYSTATIN powder Apply topically.     sertraline  (ZOLOFT ) 100 MG tablet Take 1.5 tablets (150 mg total) by mouth daily. 45 tablet 2   SUMAtriptan (IMITREX) 100 MG tablet Take by mouth.     Vitamin D , Ergocalciferol , (DRISDOL ) 1.25 MG (50000 UNIT) CAPS capsule Take 1 capsule (50,000 Units total) by mouth every 7 (seven) days. 12 capsule 3   No current facility-administered medications for this visit.     Musculoskeletal: Strength & Muscle Tone: within normal limits Gait & Station: normal Patient leans: N/A  Psychiatric Specialty Exam: Review of Systems  Psychiatric/Behavioral:  Positive for dysphoric mood and sleep disturbance.   All other systems reviewed and are negative.   There were no vitals taken for this visit.There is no height or weight on file to calculate BMI.  General Appearance: Casual and Fairly Groomed  Eye Contact:  Good  Speech:  Clear and Coherent  Volume:  Normal   Mood:  Depressed  Affect:  Flat  Thought Process:  Goal Directed  Orientation:  Full (Time, Place, and Person)  Thought Content: Rumination   Suicidal Thoughts:  No  Homicidal Thoughts:  No  Memory:  Immediate;   Good Recent;   Good Remote;   NA  Judgement:  Good  Insight:  Good  Psychomotor Activity:  Decreased  Concentration:  Concentration: Good and Attention Span: Good  Recall:  Good  Fund of Knowledge: Good  Language: Good  Akathisia:  No  Handed:  Right  AIMS (if indicated): not done  Assets:  Communication Skills Desire for Improvement Resilience Social Support  ADL's:  Intact  Cognition: WNL  Sleep:  Poor   Screenings: GAD-7    Flowsheet Row Office Visit from 03/13/2023 in [facility] [place]  Total GAD-7 Score 14   PHQ2-9    Flowsheet Row Office Visit from 03/13/2023 in [facility] [place] Video Visit from 09/26/2022 in [facility] at [place] Video Visit from 08/29/2022 in [facility] at [place] Video Visit from 07/04/2022 in [facility] at [place] Video Visit from 06/05/2022 in [facility] Total Score 4 0 1 1 1   PHQ-9 Total Score 12 -- -- -- --   Flowsheet Row Office Visit from 03/13/2023 in [facility] [place] Video Visit from 09/26/2022 in [facility] at [place] Video Visit from 08/29/2022 in [facility] at [place]  C-SSRS RISK CATEGORY No Risk No Risk No Risk     Assessment and Plan: This patient is a 48 year old female with a history of bipolar disorder generalized anxiety and ADD.  She seems more depressed again particular with all the health issues  in her family.  We will increase Zoloft  to 150 mg daily for depression, increase mirtazapine  to 45 mg at  bedtime for anxiety and sleep continue methylphenidate  20 mg twice daily for ADD, clonazepam  1 mg 3 times daily for generalized anxiety and Vraylar  4.5 mg daily for bipolar disorder.  She will return to see me in 4 weeks  Collaboration of Care: Collaboration of Care: Primary Care Provider AEB notes to be shared with PCP at patient's request  Patient/Guardian was advised Release of Information must be obtained prior to any record release in order to collaborate their care with an outside provider. Patient/Guardian was advised if they have not already done so to contact the registration department to sign all necessary forms in order for us  to release information regarding their care.   Consent: Patient/Guardian gives verbal consent for treatment and assignment of benefits for services provided during this visit. Patient/Guardian expressed understanding and agreed to proceed.    Barnie Gull, MD 12/06/2024, 11:37 AM     [1]  Allergies Allergen Reactions   Oxycodone-Acetaminophen Itching   Ozempic  (0.25 Or 0.5 Mg-Dose) [Semaglutide (0.25 Or 0.5mg -Dos)] Itching and Rash

## 2024-12-29 ENCOUNTER — Ambulatory Visit: Payer: MEDICAID | Admitting: Cardiology

## 2025-01-03 ENCOUNTER — Encounter (HOSPITAL_COMMUNITY): Payer: Self-pay | Admitting: Psychiatry

## 2025-01-03 ENCOUNTER — Telehealth (HOSPITAL_COMMUNITY): Payer: MEDICAID | Admitting: Psychiatry

## 2025-01-03 ENCOUNTER — Other Ambulatory Visit (HOSPITAL_COMMUNITY): Payer: Self-pay | Admitting: Psychiatry

## 2025-01-03 DIAGNOSIS — F3162 Bipolar disorder, current episode mixed, moderate: Secondary | ICD-10-CM

## 2025-01-03 DIAGNOSIS — F988 Other specified behavioral and emotional disorders with onset usually occurring in childhood and adolescence: Secondary | ICD-10-CM | POA: Diagnosis not present

## 2025-01-03 DIAGNOSIS — F319 Bipolar disorder, unspecified: Secondary | ICD-10-CM | POA: Diagnosis not present

## 2025-01-03 DIAGNOSIS — F411 Generalized anxiety disorder: Secondary | ICD-10-CM

## 2025-01-03 DIAGNOSIS — F902 Attention-deficit hyperactivity disorder, combined type: Secondary | ICD-10-CM

## 2025-01-03 MED ORDER — CLONAZEPAM 1 MG PO TABS: 1.0000 mg | ORAL_TABLET | Freq: Three times a day (TID) | ORAL | 2 refills | Status: AC

## 2025-01-03 MED ORDER — MIRTAZAPINE 45 MG PO TABS: 45.0000 mg | ORAL_TABLET | Freq: Every day | ORAL | 2 refills | Status: AC

## 2025-01-03 MED ORDER — METHYLPHENIDATE HCL 20 MG PO TABS: 20.0000 mg | ORAL_TABLET | Freq: Two times a day (BID) | ORAL | 0 refills | Status: AC

## 2025-01-03 MED ORDER — VRAYLAR 4.5 MG PO CAPS: 1.0000 | ORAL_CAPSULE | Freq: Every day | ORAL | 2 refills | Status: AC

## 2025-01-03 MED ORDER — SERTRALINE HCL 100 MG PO TABS: 150.0000 mg | ORAL_TABLET | Freq: Every day | ORAL | 2 refills | Status: AC

## 2025-01-03 NOTE — Progress Notes (Signed)
 Virtual Visit via Video Note  I connected with Catherine Howard on 01/03/2025 at 11:20 AM EST by a video enabled telemedicine application and verified that I am speaking with the correct person using two identifiers.  Location: Patient: home Provider: office   I discussed the limitations of evaluation and management by telemedicine and the availability of in person appointments. The patient expressed understanding and agreed to proceed.      I discussed the assessment and treatment plan with the patient. The patient was provided an opportunity to ask questions and all were answered. The patient agreed with the plan and demonstrated an understanding of the instructions.   The patient was advised to call back or seek an in-person evaluation if the symptoms worsen or if the condition fails to improve as anticipated.  I provided 20 minutes of non-face-to-face time during this encounter.   Barnie Gull, MD  [facility] MD/PA/NP OP Progress Note  01/03/2025 11:36 AM Catherine Howard  MRN:  [id number]  Chief Complaint:  Chief Complaint  Patient presents with   Anxiety   Depression   Follow-up   ADD   HPI: This patient is a 49 year old divorced white female who lives with her son in [place]. She had a 57 year old son died of a drug overdose in January 28, 2013. The patient does not work and is on disability   The patient returns for follow-up after 4 weeks regarding her major depression generalized anxiety disorder bipolar disorder and ADD.  Last time she was stressed and worried.  Her uncle had been in hospice complex care but she now tells me that he passed away a few days before Christmas.  She knows that he had several severe medical issues and this was inevitable.  Last time we increased both Zoloft  and mirtazapine  and she is feeling better.  She denies significant depression or thoughts of suicidal ideation.  She is now sleeping better.  The clonazepam  continues to help with anxiety and methylphenidate   continues to help with focus.  She denies significant mood swings. Visit Diagnosis:    ICD-10-CM   1. Bipolar 1 disorder, mixed, moderate (HCC)  F31.62     2. Attention deficit hyperactivity disorder (ADHD), combined type  F90.2       Past Psychiatric History: Previous outpatient treatment  Past Medical History:  Past Medical History:  Diagnosis Date   ADHD    Bipolar I disorder (HCC)    Elevated cholesterol    GERD (gastroesophageal reflux disease)    Thyroid disease    Type 2 diabetes mellitus (HCC)    Vitamin D  deficiency     Past Surgical History:  Procedure Laterality Date   CESAREAN SECTION     DILATION AND CURETTAGE OF UTERUS      Family Psychiatric History: See below  Family History:  Family History  Problem Relation Age of Onset   Schizophrenia Father    Alcohol abuse Father    Diabetes Father    Hyperlipidemia Father    Alcohol abuse Paternal Uncle     Social History:  Social History   Socioeconomic History   Marital status: Single    Spouse name: Not on file   Number of children: Not on file   Years of education: Not on file   Highest education level: Not on file  Occupational History   Not on file  Tobacco Use   Smoking status: Every Day    Current packs/day: 2.00    Average packs/day: 2.0 packs/day for 32.0 years (64.0  ttl pk-yrs)    Types: Cigarettes   Smokeless tobacco: Never   Tobacco comments:    smokes 3/4 of a pack a day--01/22/2021  Vaping Use   Vaping status: Former  Substance and Sexual Activity   Alcohol use: No    Alcohol/week: 0.0 standard drinks of alcohol   Drug use: No   Sexual activity: Yes    Partners: Male    Birth control/protection: None  Other Topics Concern   Not on file  Social History Narrative   Not on file   Social Drivers of Health   Tobacco Use: High Risk (01/03/2025)   Patient History    Smoking Tobacco Use: Every Day    Smokeless Tobacco Use: Never    Passive Exposure: Not on file  Financial  Resource Strain: Not on file  Food Insecurity: Not on file  Transportation Needs: Not on file  Physical Activity: Not on file  Stress: Not on file  Social Connections: Not on file  Depression (PHQ2-9): High Risk (03/13/2023)   Depression (PHQ2-9)    PHQ-2 Score: 12  Alcohol Screen: Not on file  Housing: Not on file  Utilities: Not on file  Health Literacy: Not on file    Allergies: Allergies[1]  Metabolic Disorder Labs: Lab Results  Component Value Date   HGBA1C 6.2 (A) 09/14/2024   No results found for: PROLACTIN Lab Results  Component Value Date   CHOL 137 02/06/2023   TRIG 95 02/06/2023   HDL 55 02/06/2023   CHOLHDL 5.7 (H) 10/17/2021   LDLCALC 64 02/06/2023   LDLCALC 124 (H) 10/17/2021   Lab Results  Component Value Date   TSH 2.24 10/06/2024   TSH 1.380 11/04/2023    Therapeutic Level Labs: Lab Results  Component Value Date   LITHIUM  0.40 (L) 07/14/2014   No results found for: VALPROATE No results found for: CBMZ  Current Medications: Current Outpatient Medications  Medication Sig Dispense Refill   methylphenidate  (RITALIN ) 20 MG tablet Take 1 tablet (20 mg total) by mouth 2 (two) times daily with breakfast and lunch. 60 tablet 0   methylphenidate  (RITALIN ) 20 MG tablet Take 1 tablet (20 mg total) by mouth 2 (two) times daily with breakfast and lunch. 60 tablet 0   ACCU-CHEK AVIVA PLUS test strip SMARTSIG:Via Meter     Accu-Chek Softclix Lancets lancets SMARTSIG:Topical     atorvastatin (LIPITOR) 40 MG tablet Take 40 mg by mouth daily.     Azelastine-Fluticasone 137-50 MCG/ACT SUSP Place 1 spray into the nose in the morning and at bedtime. One spray each nostril two times daily     Cariprazine  HCl (VRAYLAR ) 4.5 MG CAPS Take 1 capsule (4.5 mg total) by mouth daily. 30 capsule 2   clonazePAM  (KLONOPIN ) 1 MG tablet Take 1 tablet (1 mg total) by mouth 3 (three) times daily. 90 tablet 2   diclofenac  (VOLTAREN ) 75 MG EC tablet Take 1 tablet (75 mg total)  by mouth 2 (two) times daily with a meal. 60 tablet 2   esomeprazole  (NEXIUM ) 40 MG capsule Take 1 capsule (40 mg total) by mouth 2 (two) times daily. Take 30- 60 min before your first and last meals of the day 180 capsule 3   fenofibrate  (TRICOR ) 145 MG tablet Take 1 tablet (145 mg total) by mouth daily. 90 tablet 3   ipratropium (ATROVENT  HFA) 17 MCG/ACT inhaler Inhale 2 puffs into the lungs every 6 (six) hours as needed for wheezing. 1 each 11   ipratropium (ATROVENT ) 0.03 %  nasal spray USE TWO SPRAYS in each nostril TWICE DAILY x10 DAYS AS NEEDED     levocetirizine (XYZAL) 5 MG tablet 1 tablet in the evening     levothyroxine  (SYNTHROID ) 75 MCG tablet Take 1 tablet (75 mcg total) by mouth daily. 90 tablet 3   methylphenidate  (RITALIN ) 20 MG tablet Take 1 tablet (20 mg total) by mouth 2 (two) times daily with breakfast and lunch. 60 tablet 0   mirtazapine  (REMERON ) 45 MG tablet Take 1 tablet (45 mg total) by mouth at bedtime. 30 tablet 2   montelukast (SINGULAIR) 10 MG tablet SMARTSIG:1 Tablet(s) By Mouth Every Evening     MOUNJARO  12.5 MG/0.5ML Pen INJECT 12.5MG  SUBCUTANEOUSLY ONCE A WEEK 6 mL 3   naloxegol  oxalate (MOVANTIK ) 12.5 MG TABS tablet Take 1 tablet (12.5 mg total) by mouth daily. 30 tablet 1   NOVOFINE PEN NEEDLE 32G X 6 MM MISC USE AS DIRECTED 100 each 3   NYSTATIN powder Apply topically.     sertraline  (ZOLOFT ) 100 MG tablet Take 1.5 tablets (150 mg total) by mouth daily. 45 tablet 2   SUMAtriptan (IMITREX) 100 MG tablet Take by mouth.     Vitamin D , Ergocalciferol , (DRISDOL ) 1.25 MG (50000 UNIT) CAPS capsule Take 1 capsule (50,000 Units total) by mouth every 7 (seven) days. 12 capsule 3   No current facility-administered medications for this visit.     Musculoskeletal: Strength & Muscle Tone: within normal limits Gait & Station: normal Patient leans: N/A  Psychiatric Specialty Exam: Review of Systems  All other systems reviewed and are negative.   There were no vitals  taken for this visit.There is no height or weight on file to calculate BMI.  General Appearance: Casual and Fairly Groomed  Eye Contact:  Good  Speech:  Clear and Coherent  Volume:  Normal  Mood:  Euthymic  Affect:  Congruent  Thought Process:  Goal Directed  Orientation:  Full (Time, Place, and Person)  Thought Content: WDL   Suicidal Thoughts:  No  Homicidal Thoughts:  No  Memory:  Immediate;   Good Recent;   Good Remote;   NA  Judgement:  Good  Insight:  Fair  Psychomotor Activity:  Normal  Concentration:  Concentration: Good and Attention Span: Good  Recall:  Good  Fund of Knowledge: Good  Language: Good  Akathisia:  No  Handed:  Right  AIMS (if indicated): not done  Assets:  Communication Skills Desire for Improvement Resilience Social Support Talents/Skills  ADL's:  Intact  Cognition: WNL  Sleep:  Good   Screenings: GAD-7    Flowsheet Row Office Visit from 03/13/2023 in [facility] [place]  Total GAD-7 Score 14   PHQ2-9    Flowsheet Row Office Visit from 03/13/2023 in [facility] [place] Video Visit from 09/26/2022 in [facility] at [place] Video Visit from 08/29/2022 in [facility] at [place] Video Visit from 07/04/2022 in [facility] at [place] Video Visit from 06/05/2022 in [facility] at [facility] Total Score 4 0 1 1 1   PHQ-9 Total Score 12 -- -- -- --   Flowsheet Row Office Visit from 03/13/2023 in [facility] [place] Video Visit from 09/26/2022 in [facility] at [place] Video Visit from 08/29/2022 in [facility] at [place]  C-SSRS RISK CATEGORY No Risk No Risk No Risk  Assessment and Plan: This patient is a 49 year old female with a history of bipolar disorder,  generalized anxiety disorder and ADD.  She is doing better on her current regimen.  She will continue Zoloft  150 mg daily for major depression, mirtazapine  45 mg at bedtime for anxiety and sleep, clonazepam  1 mg 3 times daily for generalized anxiety, Vraylar  4.5 mg daily for bipolar disorder and methylphenidate  20 mg twice daily for ADD.  She will return to see me in 3 months  Collaboration of Care: Collaboration of Care: Primary Care Provider AEB notes will be shared with PCP at patient's request  Patient/Guardian was advised Release of Information must be obtained prior to any record release in order to collaborate their care with an outside provider. Patient/Guardian was advised if they have not already done so to contact the registration department to sign all necessary forms in order for us  to release information regarding their care.   Consent: Patient/Guardian gives verbal consent for treatment and assignment of benefits for services provided during this visit. Patient/Guardian expressed understanding and agreed to proceed.    Barnie Gull, MD 01/03/2025, 11:36 AM     [1]  Allergies Allergen Reactions   Oxycodone-Acetaminophen Itching   Ozempic  (0.25 Or 0.5 Mg-Dose) [Semaglutide (0.25 Or 0.5mg -Dos)] Itching and Rash

## 2025-01-18 ENCOUNTER — Telehealth: Payer: Self-pay | Admitting: Cardiology

## 2025-01-18 NOTE — Telephone Encounter (Signed)
 Patient says she is returning a call, but no one left her a message. Please advise.

## 2025-03-14 ENCOUNTER — Ambulatory Visit: Payer: MEDICAID | Admitting: Nurse Practitioner

## 2025-03-21 ENCOUNTER — Ambulatory Visit: Payer: MEDICAID | Admitting: Cardiology

## 2025-04-04 ENCOUNTER — Telehealth (HOSPITAL_COMMUNITY): Payer: MEDICAID | Admitting: Psychiatry
